# Patient Record
Sex: Male | Born: 1970 | Race: Black or African American | Hispanic: No | Marital: Married | Smoking: Never smoker
Health system: Southern US, Community
[De-identification: ages and names within clinical notes are randomized; demographics above are authoritative.]

## PROBLEM LIST (undated history)

## (undated) DIAGNOSIS — I5020 Unspecified systolic (congestive) heart failure: Secondary | ICD-10-CM

## (undated) DIAGNOSIS — I340 Nonrheumatic mitral (valve) insufficiency: Secondary | ICD-10-CM

## (undated) DIAGNOSIS — I10 Essential (primary) hypertension: Secondary | ICD-10-CM

## (undated) DIAGNOSIS — I428 Other cardiomyopathies: Secondary | ICD-10-CM

## (undated) DIAGNOSIS — Z95810 Presence of automatic (implantable) cardiac defibrillator: Secondary | ICD-10-CM

## (undated) DIAGNOSIS — I509 Heart failure, unspecified: Secondary | ICD-10-CM

## (undated) HISTORY — DX: Presence of automatic (implantable) cardiac defibrillator: Z95.810

## (undated) HISTORY — PX: HAND SURGERY: SHX662

## (undated) HISTORY — DX: Other cardiomyopathies: I42.8

## (undated) HISTORY — PX: NASAL SEPTUM SURGERY: SHX37

## (undated) HISTORY — DX: Nonrheumatic mitral (valve) insufficiency: I34.0

## (undated) HISTORY — DX: Unspecified systolic (congestive) heart failure: I50.20

---

## 2006-10-27 ENCOUNTER — Emergency Department (HOSPITAL_COMMUNITY): Admission: EM | Admit: 2006-10-27 | Discharge: 2006-10-27 | Payer: Self-pay | Admitting: Emergency Medicine

## 2006-12-05 ENCOUNTER — Emergency Department (HOSPITAL_COMMUNITY): Admission: EM | Admit: 2006-12-05 | Discharge: 2006-12-05 | Payer: Self-pay | Admitting: Emergency Medicine

## 2007-04-18 ENCOUNTER — Emergency Department (HOSPITAL_COMMUNITY): Admission: EM | Admit: 2007-04-18 | Discharge: 2007-04-18 | Payer: Self-pay | Admitting: Emergency Medicine

## 2007-08-05 ENCOUNTER — Emergency Department (HOSPITAL_COMMUNITY): Admission: EM | Admit: 2007-08-05 | Discharge: 2007-08-05 | Payer: Self-pay | Admitting: Emergency Medicine

## 2007-08-06 ENCOUNTER — Emergency Department (HOSPITAL_COMMUNITY): Admission: EM | Admit: 2007-08-06 | Discharge: 2007-08-06 | Payer: Self-pay | Admitting: Emergency Medicine

## 2007-08-27 ENCOUNTER — Emergency Department (HOSPITAL_COMMUNITY): Admission: EM | Admit: 2007-08-27 | Discharge: 2007-08-27 | Payer: Self-pay | Admitting: Emergency Medicine

## 2007-10-03 ENCOUNTER — Ambulatory Visit: Payer: Self-pay | Admitting: *Deleted

## 2007-10-03 ENCOUNTER — Ambulatory Visit: Payer: Self-pay | Admitting: Internal Medicine

## 2007-12-19 ENCOUNTER — Ambulatory Visit: Payer: Self-pay | Admitting: Family Medicine

## 2007-12-28 ENCOUNTER — Ambulatory Visit: Payer: Self-pay | Admitting: Family Medicine

## 2007-12-29 ENCOUNTER — Ambulatory Visit: Payer: Self-pay | Admitting: Internal Medicine

## 2007-12-29 LAB — CONVERTED CEMR LAB
ALT: 18 units/L (ref 0–53)
Basophils Absolute: 0 10*3/uL (ref 0.0–0.1)
CO2: 24 meq/L (ref 19–32)
Calcium: 9.5 mg/dL (ref 8.4–10.5)
Chloride: 101 meq/L (ref 96–112)
Hemoglobin: 14 g/dL (ref 13.0–17.0)
Lymphocytes Relative: 40 % (ref 12–46)
Neutro Abs: 2.2 10*3/uL (ref 1.7–7.7)
Neutrophils Relative %: 47 % (ref 43–77)
Platelets: 280 10*3/uL (ref 150–400)
Potassium: 4.4 meq/L (ref 3.5–5.3)
RDW: 16 % — ABNORMAL HIGH (ref 11.5–15.5)
Sodium: 137 meq/L (ref 135–145)
TSH: 0.932 microintl units/mL (ref 0.350–4.50)
Total Protein: 8 g/dL (ref 6.0–8.3)

## 2008-01-08 ENCOUNTER — Emergency Department (HOSPITAL_COMMUNITY): Admission: EM | Admit: 2008-01-08 | Discharge: 2008-01-08 | Payer: Self-pay | Admitting: Emergency Medicine

## 2008-01-10 ENCOUNTER — Ambulatory Visit: Payer: Self-pay | Admitting: Cardiology

## 2008-01-17 ENCOUNTER — Encounter: Payer: Self-pay | Admitting: Cardiology

## 2008-01-17 ENCOUNTER — Ambulatory Visit (HOSPITAL_COMMUNITY): Admission: RE | Admit: 2008-01-17 | Discharge: 2008-01-17 | Payer: Self-pay | Admitting: Cardiology

## 2008-01-17 ENCOUNTER — Ambulatory Visit: Payer: Self-pay | Admitting: Cardiology

## 2008-01-31 ENCOUNTER — Ambulatory Visit: Payer: Self-pay | Admitting: Family Medicine

## 2008-01-31 LAB — CONVERTED CEMR LAB
LDL Cholesterol: 120 mg/dL — ABNORMAL HIGH (ref 0–99)
Pro B Natriuretic peptide (BNP): 39 pg/mL (ref 0.0–100.0)
VLDL: 12 mg/dL (ref 0–40)

## 2008-04-12 ENCOUNTER — Emergency Department (HOSPITAL_COMMUNITY): Admission: EM | Admit: 2008-04-12 | Discharge: 2008-04-12 | Payer: Self-pay | Admitting: Emergency Medicine

## 2008-04-13 ENCOUNTER — Emergency Department (HOSPITAL_COMMUNITY): Admission: EM | Admit: 2008-04-13 | Discharge: 2008-04-13 | Payer: Self-pay | Admitting: Emergency Medicine

## 2008-08-03 ENCOUNTER — Emergency Department (HOSPITAL_COMMUNITY): Admission: EM | Admit: 2008-08-03 | Discharge: 2008-08-03 | Payer: Self-pay | Admitting: Emergency Medicine

## 2009-02-18 ENCOUNTER — Emergency Department (HOSPITAL_COMMUNITY): Admission: EM | Admit: 2009-02-18 | Discharge: 2009-02-18 | Payer: Self-pay | Admitting: Emergency Medicine

## 2009-07-27 ENCOUNTER — Emergency Department (HOSPITAL_BASED_OUTPATIENT_CLINIC_OR_DEPARTMENT_OTHER): Admission: EM | Admit: 2009-07-27 | Discharge: 2009-07-27 | Payer: Self-pay | Admitting: Emergency Medicine

## 2009-09-24 ENCOUNTER — Emergency Department (HOSPITAL_BASED_OUTPATIENT_CLINIC_OR_DEPARTMENT_OTHER): Admission: EM | Admit: 2009-09-24 | Discharge: 2009-09-24 | Payer: Self-pay | Admitting: Emergency Medicine

## 2010-05-02 ENCOUNTER — Emergency Department (HOSPITAL_BASED_OUTPATIENT_CLINIC_OR_DEPARTMENT_OTHER)
Admission: EM | Admit: 2010-05-02 | Discharge: 2010-05-02 | Disposition: A | Discharge status: Home / Self Care | Payer: Self-pay | Attending: Emergency Medicine | Admitting: Emergency Medicine

## 2010-05-02 DIAGNOSIS — Z8614 Personal history of Methicillin resistant Staphylococcus aureus infection: Secondary | ICD-10-CM | POA: Insufficient documentation

## 2010-05-02 DIAGNOSIS — IMO0002 Reserved for concepts with insufficient information to code with codable children: Secondary | ICD-10-CM | POA: Insufficient documentation

## 2010-06-24 LAB — CULTURE, ROUTINE-ABSCESS

## 2010-07-22 NOTE — Assessment & Plan Note (Signed)
[facility]                            CARDIOLOGY OFFICE NOTE   NAME:Jeffery Glenn, Jeffery Glenn                          MRN:          [id number]  DATE:01/10/2008                            DOB:          1970/09/09    PRIMARY CARE PHYSICIAN:  Dineen Kid. Reche Dixon, MD   REASON FOR PRESENTATION:  Evaluate the patient with premature  ventricular contractions.   HISTORY OF PRESENT ILLNESS:  The patient is a very pleasant 40 year old  gentleman who had some chest discomfort some years ago and reports a  stress test.  He has a very dramatic family history.  Recently, he was  getting some teeth pulled and was noted to have PVCs.  He went to the  [facility] and there they did record PVCs in a bigeminal pattern.  He  says occasionally he notices these.  It is usually when he is quiet at  night.  He does not notice them during the day.  He cannot bring them  on.  He does not have any presyncope or syncope.  He feels his heart  flip or skip.  He does not feel any sustained tachy palpitations.  He  does not get any chest pressure, neck or arm discomfort.  He does get  dyspneic walking up stairs which she has noticed more recently.  He  works but does not exercise routinely.   PAST MEDICAL HISTORY:  He has formal history of hypertension, though he  has noted his blood pressures have been elevated.  He has no history of  hyperlipidemia or diabetes.   PAST SURGICAL HISTORY:  Tonsillectomy, nasal reconstruction, right hand  surgery.   ALLERGIES:  HAY FEVER.   MEDICATIONS:  Claritin and Mucinex.   SOCIAL HISTORY:  The patient is a Curator.  He is married.  He has 5  children, all living at home.  He has never smoked cigarettes.  He does  not drink alcohol.   FAMILY HISTORY:  Contributory for his father having coronary artery  disease starting in his 69s with repeat bypasses.  He died of coronary  artery disease at age 93.  His mother died of a massive myocardial  infarction  at age 51.  He has 4 siblings without heart disease.   REVIEW OF SYSTEMS:  As stated in the HPI,  positive for recent headache.  Negative for other systems.   PHYSICAL EXAMINATION:  GENERAL:  The patient is pleasant and in no  distress.  VITAL SIGNS:  Blood pressure 166/108, heart rate 54 and regular, body  mass index 27, weight 213 pounds.  HEENT:  Eyelids are unremarkable, pupils are equal, round and reactive  to light, fundi not visualized, oral mucosa unremarkable.  NECK:  No jugular venous distention at 45 degrees, carotid upstroke  brisk and symmetric, no bruits, no thyromegaly.  LYMPHATICS:  No cervical, axillary, or inguinal adenopathy.  LUNGS:  Clear to auscultation bilaterally.  BACK:  No costovertebral angle tenderness.  CHEST:  Unremarkable.  HEART:  PMI is somewhat sustained but not broad, it is not displaced  slightly laterally.  S1  and S2 within normals, no S3, no S4, no clicks,  no rubs, no murmurs.  ABDOMEN:  Flat, positive bowel sounds normal in frequency and pitch, no  bruits, no rebound, on guarding or midline pulsatile mass, no  hepatomegaly, no splenomegaly.  SKIN:  No rashes, no nodules.  EXTREMITIES:  2+ pulses throughout, no edema, no cyanosis, no clubbing.  NEURO:  Oriented to person, place and time, cranial nerves II-XII  grossly intact, motor grossly intact throughout.   EKG sinus rhythm, rate 54, axis within normal limits, intervals within  normal limits, minimal voltage criteria for left ventricle hypertrophy,  no acute ST-wave changes.   ASSESSMENT AND PLAN:  1. Premature ventricular attractions.  The patient is not really      noticing these.  He had electrolytes that were normal.  His TSH was      normal.  At this point, I am going to get an echocardiogram for      variable reasons.  I think, he probably has a structurally normal      heart however.  If this turns out to be normal and he is having no      symptoms then I probably would not pursue  further evaluation.  2. Hypertension.  His blood pressure is elevated today.  He says it      has been elevated on a couple other occasions, but he does not get      it checked very frequently.  I have asked him to invest on a      pressure cuff.  I will go ahead with that family history and      aggressively manage this.  I am going to start him on      hydrochlorothiazide 12.5 mg a day.  He will only increase his      potassium containing foods.  We will also look for LVH on his      echocardiogram.  3. Risk reduction.  The patient has a very dramatic family history of      early coronary artery disease.  Given this, he needs to know his      lipid profile.  I would be very aggressive in managing this and      have a low threshold for statins.  I have talked to him about this.      I will go ahead and order a fasting lipid profile.  4. Followup.  I would like to see him back in about 3 months to review      all of this, but sooner if he has any problems.     Rollene Rotunda, MD, [facility]  Electronically Signed    JH/MedQ  DD: 01/10/2008  DT: 01/10/2008  Job #: [id number]   cc:   Dineen Kid. Reche Dixon, M.D.

## 2010-07-26 ENCOUNTER — Emergency Department (HOSPITAL_BASED_OUTPATIENT_CLINIC_OR_DEPARTMENT_OTHER)
Admission: EM | Admit: 2010-07-26 | Discharge: 2010-07-27 | Disposition: A | Discharge status: Home / Self Care | Payer: Self-pay | Attending: Emergency Medicine | Admitting: Emergency Medicine

## 2010-07-26 DIAGNOSIS — K047 Periapical abscess without sinus: Secondary | ICD-10-CM | POA: Insufficient documentation

## 2010-07-26 DIAGNOSIS — K089 Disorder of teeth and supporting structures, unspecified: Secondary | ICD-10-CM | POA: Insufficient documentation

## 2010-09-04 ENCOUNTER — Emergency Department (HOSPITAL_COMMUNITY)
Admission: EM | Admit: 2010-09-04 | Discharge: 2010-09-04 | Discharge status: Against Medical Advice | Payer: Self-pay | Attending: Emergency Medicine | Admitting: Emergency Medicine

## 2010-09-04 ENCOUNTER — Emergency Department (HOSPITAL_BASED_OUTPATIENT_CLINIC_OR_DEPARTMENT_OTHER)
Admission: EM | Admit: 2010-09-04 | Discharge: 2010-09-04 | Disposition: A | Discharge status: Home / Self Care | Payer: Self-pay | Attending: Emergency Medicine | Admitting: Emergency Medicine

## 2010-09-04 DIAGNOSIS — R220 Localized swelling, mass and lump, head: Secondary | ICD-10-CM | POA: Insufficient documentation

## 2010-09-04 DIAGNOSIS — R221 Localized swelling, mass and lump, neck: Secondary | ICD-10-CM | POA: Insufficient documentation

## 2010-09-04 DIAGNOSIS — K089 Disorder of teeth and supporting structures, unspecified: Secondary | ICD-10-CM | POA: Insufficient documentation

## 2011-01-22 ENCOUNTER — Emergency Department (INDEPENDENT_AMBULATORY_CARE_PROVIDER_SITE_OTHER): Payer: Self-pay

## 2011-01-22 ENCOUNTER — Encounter: Payer: Self-pay | Admitting: *Deleted

## 2011-01-22 ENCOUNTER — Emergency Department (HOSPITAL_BASED_OUTPATIENT_CLINIC_OR_DEPARTMENT_OTHER)
Admission: EM | Admit: 2011-01-22 | Discharge: 2011-01-23 | Disposition: A | Discharge status: Home / Self Care | Payer: Self-pay | Attending: Emergency Medicine | Admitting: Emergency Medicine

## 2011-01-22 DIAGNOSIS — S2239XA Fracture of one rib, unspecified side, initial encounter for closed fracture: Secondary | ICD-10-CM | POA: Insufficient documentation

## 2011-01-22 DIAGNOSIS — R0789 Other chest pain: Secondary | ICD-10-CM
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
## 2011-01-23 MED ORDER — OXYCODONE-ACETAMINOPHEN 5-325 MG PO TABS
2.0000 | ORAL_TABLET | Freq: Once | ORAL | Status: AC
  Administered 2011-01-23: 2 via ORAL
  Filled 2011-01-23: qty 2

## 2011-01-23 MED ORDER — OXYCODONE-ACETAMINOPHEN 5-325 MG PO TABS: 2.0000 | ORAL_TABLET | ORAL | Status: AC | PRN
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
## 2015-02-08 ENCOUNTER — Emergency Department (HOSPITAL_BASED_OUTPATIENT_CLINIC_OR_DEPARTMENT_OTHER)
Admission: EM | Admit: 2015-02-08 | Discharge: 2015-02-09 | Disposition: A | Discharge status: Home / Self Care | Payer: Self-pay | Attending: Emergency Medicine | Admitting: Emergency Medicine

## 2015-02-08 ENCOUNTER — Encounter (HOSPITAL_BASED_OUTPATIENT_CLINIC_OR_DEPARTMENT_OTHER): Payer: Self-pay | Admitting: Emergency Medicine

## 2015-02-08 DIAGNOSIS — I10 Essential (primary) hypertension: Secondary | ICD-10-CM | POA: Insufficient documentation

## 2015-02-08 DIAGNOSIS — Z79899 Other long term (current) drug therapy: Secondary | ICD-10-CM | POA: Insufficient documentation

## 2015-02-08 DIAGNOSIS — R109 Unspecified abdominal pain: Secondary | ICD-10-CM | POA: Insufficient documentation

## 2015-02-08 DIAGNOSIS — J069 Acute upper respiratory infection, unspecified: Secondary | ICD-10-CM | POA: Insufficient documentation

## 2015-02-08 DIAGNOSIS — R110 Nausea: Secondary | ICD-10-CM | POA: Insufficient documentation

## 2015-02-08 HISTORY — DX: Essential (primary) hypertension: I10

## 2015-02-08 MED ORDER — BENZONATATE 100 MG PO CAPS: 100.0000 mg | ORAL_CAPSULE | Freq: Three times a day (TID) | ORAL | Status: DC

## 2015-02-08 MED ORDER — HYDROCODONE-HOMATROPINE 5-1.5 MG/5ML PO SYRP: 5.0000 mL | ORAL_SOLUTION | Freq: Four times a day (QID) | ORAL | Status: DC | PRN

## 2015-02-08 MED ORDER — PROMETHAZINE HCL 25 MG PO TABS: 25.0000 mg | ORAL_TABLET | Freq: Four times a day (QID) | ORAL | Status: DC | PRN

## 2015-02-08 NOTE — Discharge Instructions (Signed)
Upper Respiratory Infection, Adult Most upper respiratory infections (URIs) are a viral infection of the air passages leading to the lungs. A URI affects the nose, throat, and upper air passages. The most common type of URI is nasopharyngitis and is typically referred to as "the common cold." URIs run their course and usually go away on their own. Most of the time, a URI does not require medical attention, but sometimes a bacterial infection in the upper airways can follow a viral infection. This is called a secondary infection. Sinus and middle ear infections are common types of secondary upper respiratory infections. Bacterial pneumonia can also complicate a URI. A URI can worsen asthma and chronic obstructive pulmonary disease (COPD). Sometimes, these complications can require emergency medical care and may be life threatening.  CAUSES Almost all URIs are caused by viruses. A virus is a type of germ and can spread from one person to another.  RISKS FACTORS You may be at risk for a URI if:   You smoke.   You have chronic heart or lung disease.  You have a weakened defense (immune) system.   You are very young or very old.   You have nasal allergies or asthma.  You work in crowded or poorly ventilated areas.  You work in health care facilities or schools. SIGNS AND SYMPTOMS  Symptoms typically develop 2-3 days after you come in contact with a cold virus. Most viral URIs last 7-10 days. However, viral URIs from the influenza virus (flu virus) can last 14-18 days and are typically more severe. Symptoms may include:   Runny or stuffy (congested) nose.   Sneezing.   Cough.   Sore throat.   Headache.   Fatigue.   Fever.   Loss of appetite.   Pain in your forehead, behind your eyes, and over your cheekbones (sinus pain).  Muscle aches.  DIAGNOSIS  Your health care provider may diagnose a URI by:  Physical exam.  Tests to check that your symptoms are not due to  another condition such as:  Strep throat.  Sinusitis.  Pneumonia.  Asthma. TREATMENT  A URI goes away on its own with time. It cannot be cured with medicines, but medicines may be prescribed or recommended to relieve symptoms. Medicines may help:  Reduce your fever.  Reduce your cough.  Relieve nasal congestion. HOME CARE INSTRUCTIONS   Take medicines only as directed by your health care provider.   Gargle warm saltwater or take cough drops to comfort your throat as directed by your health care provider.  Use a warm mist humidifier or inhale steam from a shower to increase air moisture. This may make it easier to breathe.  Drink enough fluid to keep your urine clear or pale yellow.   Eat soups and other clear broths and maintain good nutrition.   Rest as needed.   Return to work when your temperature has returned to normal or as your health care provider advises. You may need to stay home longer to avoid infecting others. You can also use a face mask and careful hand washing to prevent spread of the virus.  Increase the usage of your inhaler if you have asthma.   Do not use any tobacco products, including cigarettes, chewing tobacco, or electronic cigarettes. If you need help quitting, ask your health care provider. PREVENTION  The best way to protect yourself from getting a cold is to practice good hygiene.   Avoid oral or hand contact with people with cold   symptoms.   Wash your hands often if contact occurs.  There is no clear evidence that vitamin C, vitamin E, echinacea, or exercise reduces the chance of developing a cold. However, it is always recommended to get plenty of rest, exercise, and practice good nutrition.  SEEK MEDICAL CARE IF:   You are getting worse rather than better.   Your symptoms are not controlled by medicine.   You have chills.  You have worsening shortness of breath.  You have brown or red mucus.  You have yellow or brown nasal  discharge.  You have pain in your face, especially when you bend forward.  You have a fever.  You have swollen neck glands.  You have pain while swallowing.  You have white areas in the back of your throat. SEEK IMMEDIATE MEDICAL CARE IF:   You have severe or persistent:  Headache.  Ear pain.  Sinus pain.  Chest pain.  You have chronic lung disease and any of the following:  Wheezing.  Prolonged cough.  Coughing up blood.  A change in your usual mucus.  You have a stiff neck.  You have changes in your:  Vision.  Hearing.  Thinking.  Mood. MAKE SURE YOU:   Understand these instructions.  Will watch your condition.  Will get help right away if you are not doing well or get worse.   This information is not intended to replace advice given to you by your health care provider. Make sure you discuss any questions you have with your health care provider.   Document Released: 08/19/2000 Document Revised: 07/10/2014 Document Reviewed: 05/31/2013 Elsevier Interactive Patient Education 2016 [employer] Inc.  

## 2015-02-08 NOTE — ED Notes (Signed)
Sore throat, coughing congestion for 3 days.

## 2015-02-08 NOTE — ED Provider Notes (Signed)
CSN: [id number]     Arrival date & time 02/08/15  2316 History  By signing my name below, I, Gwenyth Ober, attest that this documentation has been prepared under the direction and in the presence of Gilda Crease, MD.  Electronically Signed: Gwenyth Ober, ED Scribe. 02/08/2015. 11:32 PM.   Chief Complaint  Patient presents with  . Sore Throat   The history is provided by the patient. No language interpreter was used.    HPI Comments: Jeffery Glenn is a 44 y.o. male who presents to the Emergency Department complaining of constant, moderate sore throat that started 3 days ago. He states abdominal pain that occurred yesterday, nausea and cough as associated symptoms. Pt has not tried any treatment PTA. He denies vomiting and diarrhea.  Past Medical History  Diagnosis Date  . Hypertension    Past Surgical History  Procedure Laterality Date  . Nasal septum surgery     No family history on file. Social History  Substance Use Topics  . Smoking status: Never Smoker   . Smokeless tobacco: None  . Alcohol Use: Yes     Comment: occ   Review of Systems  HENT: Positive for sore throat.   Respiratory: Positive for cough.   Gastrointestinal: Positive for nausea and abdominal pain. Negative for vomiting and diarrhea.  All other systems reviewed and are negative.  Allergies  Review of patient's allergies indicates no known allergies.  Home Medications   Prior to Admission medications   Medication Sig Start Date End Date Taking? Authorizing Provider  amLODipine (NORVASC) 5 MG tablet Take 5 mg by mouth daily.   Yes Historical Provider, MD  hydrochlorothiazide (HYDRODIURIL) 25 MG tablet Take 25 mg by mouth daily.   Yes Historical Provider, MD  benzonatate (TESSALON) 100 MG capsule Take 1 capsule (100 mg total) by mouth every 8 (eight) hours. 02/08/15   Gilda Crease, MD  HYDROcodone-homatropine [facility]) 5-1.5 MG/5ML syrup Take 5 mLs by mouth every 6 (six) hours as needed  for cough. 02/08/15   Gilda Crease, MD  promethazine (PHENERGAN) 25 MG tablet Take 1 tablet (25 mg total) by mouth every 6 (six) hours as needed for nausea or vomiting. 02/08/15   Gilda Crease, MD   BP 170/91 mmHg  Pulse 74  Temp(Src) 98.3 F (36.8 C) (Oral)  Resp 18  Ht 6' (1.829 m)  Wt 235 lb (106.595 kg)  BMI 31.86 kg/m2 Physical Exam  Constitutional: He appears well-developed and well-nourished. No distress.  HENT:  Head: Normocephalic and atraumatic.  Right Ear: External ear normal.  Left Ear: External ear normal.  Mouth/Throat: Oropharynx is clear and moist. No oropharyngeal exudate.  Eyes: Conjunctivae and EOM are normal. Pupils are equal, round, and reactive to light.  Neck: Neck supple. No tracheal deviation present.  Cardiovascular: Normal rate, regular rhythm and normal heart sounds.   Pulmonary/Chest: Effort normal and breath sounds normal. No respiratory distress. He has no wheezes. He has no rales.  Abdominal: Soft. There is no tenderness.  Skin: Skin is warm and dry.  Psychiatric: He has a normal mood and affect. His behavior is normal.  Nursing note and vitals reviewed.   ED Course  Procedures   COORDINATION OF CARE: 11:33 PM Discussed suspicion for viral infection and treatment plan with pt. They agreed to plan.  Labs Review Labs Reviewed - No data to display  Imaging Review No results found.   EKG Interpretation None      MDM   Final  diagnoses:  URI, acute   Presents to the emergency department for evaluation of cough, chest congestion, sore throat. Symptoms began 3 days ago. Patient has clear lung fields on examination. Vital signs are stable. Oropharyngeal examination is normal, no sign peritonsillar abscess or tonsillitis. Symptoms consistent with viral URI. Will treat symptomatically.  I personally performed the services described in this documentation, which was scribed in my presence. The recorded information has been  reviewed and is accurate.    Gilda Crease, MD 02/08/15 [phone]

## 2015-03-15 ENCOUNTER — Encounter (HOSPITAL_BASED_OUTPATIENT_CLINIC_OR_DEPARTMENT_OTHER): Payer: Self-pay | Admitting: Emergency Medicine

## 2015-03-15 ENCOUNTER — Emergency Department (HOSPITAL_BASED_OUTPATIENT_CLINIC_OR_DEPARTMENT_OTHER): Payer: Self-pay

## 2015-03-15 ENCOUNTER — Emergency Department (HOSPITAL_BASED_OUTPATIENT_CLINIC_OR_DEPARTMENT_OTHER)
Admission: EM | Admit: 2015-03-15 | Discharge: 2015-03-15 | Disposition: A | Discharge status: Home / Self Care | Payer: Self-pay | Attending: Emergency Medicine | Admitting: Emergency Medicine

## 2015-03-15 DIAGNOSIS — J069 Acute upper respiratory infection, unspecified: Secondary | ICD-10-CM

## 2015-03-15 DIAGNOSIS — I10 Essential (primary) hypertension: Secondary | ICD-10-CM

## 2015-03-15 DIAGNOSIS — Z79899 Other long term (current) drug therapy: Secondary | ICD-10-CM | POA: Insufficient documentation

## 2015-03-15 DIAGNOSIS — R079 Chest pain, unspecified: Secondary | ICD-10-CM | POA: Insufficient documentation

## 2015-03-15 MED ORDER — AMLODIPINE BESYLATE 5 MG PO TABS: 5.0000 mg | ORAL_TABLET | Freq: Every day | ORAL | Status: DC

## 2015-03-15 MED ORDER — GUAIFENESIN ER 600 MG PO TB12: 600.0000 mg | ORAL_TABLET | Freq: Two times a day (BID) | ORAL | Status: DC

## 2015-03-15 MED ORDER — AMLODIPINE BESYLATE 5 MG PO TABS
5.0000 mg | ORAL_TABLET | Freq: Every day | ORAL | Status: DC
  Administered 2015-03-15: 5 mg via ORAL
  Filled 2015-03-15: qty 1

## 2015-03-15 MED ORDER — HYDROCHLOROTHIAZIDE 25 MG PO TABS: 25.0000 mg | ORAL_TABLET | Freq: Every day | ORAL | Status: DC

## 2015-03-15 MED ORDER — BENZONATATE 100 MG PO CAPS: 100.0000 mg | ORAL_CAPSULE | Freq: Three times a day (TID) | ORAL | Status: DC

## 2015-03-15 MED ORDER — HYDROCHLOROTHIAZIDE 25 MG PO TABS
25.0000 mg | ORAL_TABLET | Freq: Every day | ORAL | Status: DC
  Administered 2015-03-15: 25 mg via ORAL
  Filled 2015-03-15: qty 1

## 2015-03-15 NOTE — ED Notes (Signed)
Pt having runny nose, cough/cold symptoms for two weeks.  Pt worse in last 2-3 days with increase in cough, chest discomfort and headache.  Productive green sputum.  No known fever.

## 2015-03-15 NOTE — ED Provider Notes (Addendum)
CSN: [id number]     Arrival date & time 03/15/15  0701 History   First MD Initiated Contact with Patient 03/15/15 0720     Chief Complaint  Patient presents with  . Cough    Patient is a 46 y.o. male presenting with cough. The history is provided by the patient.  Cough Cough characteristics:  Non-productive Severity:  Moderate Duration:  3 weeks Timing:  Constant Chronicity:  New Smoker: no   Context: sick contacts   Relieved by:  Nothing Ineffective treatments: nyquil. Associated symptoms: chest pain, rhinorrhea and sinus congestion   Associated symptoms: no fever and no wheezing    patient does have history of hypertension. He doesn't have a primary doctor currently and has not taken his medications for a couple of months  Past Medical History  Diagnosis Date  . Hypertension    Past Surgical History  Procedure Laterality Date  . Nasal septum surgery     No family history on file. Social History  Substance Use Topics  . Smoking status: Never Smoker   . Smokeless tobacco: None  . Alcohol Use: Yes     Comment: occ    Review of Systems  Constitutional: Negative for fever.  HENT: Positive for rhinorrhea.   Respiratory: Positive for cough. Negative for wheezing.   Cardiovascular: Positive for chest pain.  All other systems reviewed and are negative.     Allergies  Review of patient's allergies indicates no known allergies.  Home Medications   Prior to Admission medications   Medication Sig Start Date End Date Taking? Authorizing Provider  amLODipine (NORVASC) 5 MG tablet Take 5 mg by mouth daily.    Historical Provider, MD  hydrochlorothiazide (HYDRODIURIL) 25 MG tablet Take 25 mg by mouth daily.    Historical Provider, MD   BP 179/106 mmHg  Pulse 82  Temp(Src) 98.3 F (36.8 C) (Oral)  Resp 20  Ht 6' (1.829 m)  Wt 104.327 kg  BMI 31.19 kg/m2  SpO2 100% Physical Exam  Constitutional: He appears well-developed and well-nourished. No distress.  HENT:   Head: Normocephalic and atraumatic.  Right Ear: Tympanic membrane and external ear normal.  Left Ear: Tympanic membrane and external ear normal.  Mouth/Throat: No oropharyngeal exudate, posterior oropharyngeal edema or posterior oropharyngeal erythema.  Eyes: Conjunctivae are normal. Right eye exhibits no discharge. Left eye exhibits no discharge. No scleral icterus.  Neck: Neck supple. No tracheal deviation present.  Cardiovascular: Normal rate, regular rhythm and intact distal pulses.   Pulmonary/Chest: Effort normal and breath sounds normal. No stridor. No respiratory distress. He has no wheezes. He has no rales.  Abdominal: Soft. Bowel sounds are normal. He exhibits no distension. There is no tenderness. There is no rebound and no guarding.  Musculoskeletal: He exhibits no edema or tenderness.  Neurological: He is alert. He has normal strength. No cranial nerve deficit (no facial droop, extraocular movements intact, no slurred speech) or sensory deficit. He exhibits normal muscle tone. He displays no seizure activity. Coordination normal.  Skin: Skin is warm and dry. No rash noted.  Psychiatric: He has a normal mood and affect.  Nursing note and vitals reviewed.   ED Course  Procedures  Imaging Review Dg Chest 2 View  03/15/2015  CLINICAL DATA:  Chest pain with productive cough and fever. EXAM: CHEST  2 VIEW COMPARISON:  01/22/2011 FINDINGS: The heart size and mediastinal contours are within normal limits. Both lungs are clear. The visualized skeletal structures are unremarkable. IMPRESSION: Normal chest. Electronically  Signed   By: Francene Boyers M.D.   On: 03/15/2015 07:46     MDM   Final diagnoses:  URI (upper respiratory infection)  Essential hypertension    Symptoms are consistent with a simple upper respiratory infection. There is no evidence to suggest pneumonia on CXR. The patient does not appear to have an otitis media. I discussed supportive treatment. I encouraged  followup with the primary care doctor next week if symptoms have not resolved. Warning signs and reasons to return to the emergency room were discussed.  Pt was given a dose of his BP meds.  Rx given     Linwood Dibbles, MD 03/15/15 0753   EKG Interpretation  Date/Time:  Friday March 15 2015 07:19:36 EST Ventricular Rate:  75 PR Interval:  189 QRS Duration: 102 QT Interval:  382 QTC Calculation: 427 R Axis:   -39 Text Interpretation:  Sinus rhythm Left axis deviation RSR' in V1 or V2, probably normal variant ST elev, probable normal early repol pattern No old tracing to compare Reconfirmed by [facility]  MD-J, Sumit Branham [phone]) on 03/18/2015 9:23:39 AM        Linwood Dibbles, MD 03/18/15 [phone]

## 2015-03-15 NOTE — Discharge Instructions (Signed)
Cough, Adult Coughing is a reflex that clears your throat and your airways. Coughing helps to heal and protect your lungs. It is normal to cough occasionally, but a cough that happens with other symptoms or lasts a long time may be a sign of a condition that needs treatment. A cough may last only 2-3 weeks (acute), or it may last longer than 8 weeks (chronic). CAUSES Coughing is commonly caused by:  Breathing in substances that irritate your lungs.  A viral or bacterial respiratory infection.  Allergies.  Asthma.  Postnasal drip.  Smoking.  Acid backing up from the stomach into the esophagus (gastroesophageal reflux).  Certain medicines.  Chronic lung problems, including COPD (or rarely, lung cancer).  Other medical conditions such as heart failure. HOME CARE INSTRUCTIONS  Pay attention to any changes in your symptoms. Take these actions to help with your discomfort:  Take medicines only as told by your health care provider.  If you were prescribed an antibiotic medicine, take it as told by your health care provider. Do not stop taking the antibiotic even if you start to feel better.  Talk with your health care provider before you take a cough suppressant medicine.  Drink enough fluid to keep your urine clear or pale yellow.  If the air is dry, use a cold steam vaporizer or humidifier in your bedroom or your home to help loosen secretions.  Avoid anything that causes you to cough at work or at home.  If your cough is worse at night, try sleeping in a semi-upright position.  Avoid cigarette smoke. If you smoke, quit smoking. If you need help quitting, ask your health care provider.  Avoid caffeine.  Avoid alcohol.  Rest as needed. SEEK MEDICAL CARE IF:   You have new symptoms.  You cough up pus.  Your cough does not get better after 2-3 weeks, or your cough gets worse.  You cannot control your cough with suppressant medicines and you are losing sleep.  You  develop pain that is getting worse or pain that is not controlled with pain medicines.  You have a fever.  You have unexplained weight loss.  You have night sweats. SEEK IMMEDIATE MEDICAL CARE IF:  You cough up blood.  You have difficulty breathing.  Your heartbeat is very fast.   This information is not intended to replace advice given to you by your health care provider. Make sure you discuss any questions you have with your health care provider.   Document Released: 08/22/2010 Document Revised: 11/14/2014 Document Reviewed: 05/02/2014 Elsevier Interactive Patient Education 2016 [employer]. Hypertension Hypertension, commonly called high blood pressure, is when the force of blood pumping through your arteries is too strong. Your arteries are the blood vessels that carry blood from your heart throughout your body. A blood pressure reading consists of a higher number over a lower number, such as 110/72. The higher number (systolic) is the pressure inside your arteries when your heart pumps. The lower number (diastolic) is the pressure inside your arteries when your heart relaxes. Ideally you want your blood pressure below 120/80. Hypertension forces your heart to work harder to pump blood. Your arteries may become narrow or stiff. Having untreated or uncontrolled hypertension can cause heart attack, stroke, kidney disease, and other problems. RISK FACTORS Some risk factors for high blood pressure are controllable. Others are not.  Risk factors you cannot control include:   Race. You may be at higher risk if you are African American.  Age.  Risk increases with age.  Gender. Men are at higher risk than women before age 43 years. After age 43, women are at higher risk than men. Risk factors you can control include:  Not getting enough exercise or physical activity.  Being overweight.  Getting too much fat, sugar, calories, or salt in your diet.  Drinking too much alcohol. SIGNS  AND SYMPTOMS Hypertension does not usually cause signs or symptoms. Extremely high blood pressure (hypertensive crisis) may cause headache, anxiety, shortness of breath, and nosebleed. DIAGNOSIS To check if you have hypertension, your health care provider will measure your blood pressure while you are seated, with your arm held at the level of your heart. It should be measured at least twice using the same arm. Certain conditions can cause a difference in blood pressure between your right and left arms. A blood pressure reading that is higher than normal on one occasion does not mean that you need treatment. If it is not clear whether you have high blood pressure, you may be asked to return on a different day to have your blood pressure checked again. Or, you may be asked to monitor your blood pressure at home for 1 or more weeks. TREATMENT Treating high blood pressure includes making lifestyle changes and possibly taking medicine. Living a healthy lifestyle can help lower high blood pressure. You may need to change some of your habits. Lifestyle changes may include:  Following the DASH diet. This diet is high in fruits, vegetables, and whole grains. It is low in salt, red meat, and added sugars.  Keep your sodium intake below 2,300 mg per day.  Getting at least 30-45 minutes of aerobic exercise at least 4 times per week.  Losing weight if necessary.  Not smoking.  Limiting alcoholic beverages.  Learning ways to reduce stress. Your health care provider may prescribe medicine if lifestyle changes are not enough to get your blood pressure under control, and if one of the following is true:  You are 4518-43 years of age and your systolic blood pressure is above 140.  You are 43 years of age or older, and your systolic blood pressure is above 150.  Your diastolic blood pressure is above 90.  You have diabetes, and your systolic blood pressure is over 140 or your diastolic blood pressure is  over 90.  You have kidney disease and your blood pressure is above 140/90.  You have heart disease and your blood pressure is above 140/90. Your personal target blood pressure may vary depending on your medical conditions, your age, and other factors. HOME CARE INSTRUCTIONS  Have your blood pressure rechecked as directed by your health care provider.   Take medicines only as directed by your health care provider. Follow the directions carefully. Blood pressure medicines must be taken as prescribed. The medicine does not work as well when you skip doses. Skipping doses also puts you at risk for problems.  Do not smoke.   Monitor your blood pressure at home as directed by your health care provider. SEEK MEDICAL CARE IF:   You think you are having a reaction to medicines taken.  You have recurrent headaches or feel dizzy.  You have swelling in your ankles.  You have trouble with your vision. SEEK IMMEDIATE MEDICAL CARE IF:  You develop a severe headache or confusion.  You have unusual weakness, numbness, or feel faint.  You have severe chest or abdominal pain.  You vomit repeatedly.  You have trouble breathing. MAKE SURE  YOU:   Understand these instructions.  Will watch your condition.  Will get help right away if you are not doing well or get worse.   This information is not intended to replace advice given to you by your health care provider. Make sure you discuss any questions you have with your health care provider.   Document Released: 02/23/2005 Document Revised: 07/10/2014 Document Reviewed: 12/16/2012 Elsevier Interactive Patient Education [employer]

## 2015-04-10 ENCOUNTER — Emergency Department (HOSPITAL_COMMUNITY): Payer: Self-pay

## 2015-04-10 ENCOUNTER — Encounter (HOSPITAL_COMMUNITY): Payer: Self-pay

## 2015-04-10 ENCOUNTER — Emergency Department (HOSPITAL_COMMUNITY)
Admission: EM | Admit: 2015-04-10 | Discharge: 2015-04-10 | Disposition: A | Discharge status: Home / Self Care | Payer: Self-pay | Attending: Emergency Medicine | Admitting: Emergency Medicine

## 2015-04-10 DIAGNOSIS — R079 Chest pain, unspecified: Secondary | ICD-10-CM | POA: Insufficient documentation

## 2015-04-10 DIAGNOSIS — R0602 Shortness of breath: Secondary | ICD-10-CM | POA: Insufficient documentation

## 2015-04-10 DIAGNOSIS — I10 Essential (primary) hypertension: Secondary | ICD-10-CM | POA: Insufficient documentation

## 2015-04-10 DIAGNOSIS — R200 Anesthesia of skin: Secondary | ICD-10-CM | POA: Insufficient documentation

## 2015-04-10 LAB — BASIC METABOLIC PANEL
Anion gap: 9 (ref 5–15)
BUN: 14 mg/dL (ref 6–20)
CHLORIDE: 105 mmol/L (ref 101–111)
CO2: 25 mmol/L (ref 22–32)
Calcium: 8.6 mg/dL — ABNORMAL LOW (ref 8.9–10.3)
Creatinine, Ser: 0.78 mg/dL (ref 0.61–1.24)
GFR calc Af Amer: >60 mL/min (ref >60)
GLUCOSE: 105 mg/dL — AB (ref 65–99)
POTASSIUM: 3.7 mmol/L (ref 3.5–5.1)
Sodium: 139 mmol/L (ref 135–145)

## 2015-04-10 LAB — I-STAT TROPONIN, ED
TROPONIN I, POC: 0.03 ng/mL (ref 0.00–0.08)
TROPONIN I, POC: 0.02 ng/mL (ref 0.00–0.08)

## 2015-04-10 LAB — CBC
HCT: 40.8 % (ref 39.0–52.0)
HEMOGLOBIN: 13.5 g/dL (ref 13.0–17.0)
MCH: 26.9 pg (ref 26.0–34.0)
MCHC: 33.1 g/dL (ref 30.0–36.0)
MCV: 81.3 fL (ref 78.0–100.0)
Platelets: 200 10*3/uL (ref 150–400)
RBC: 5.02 MIL/uL (ref 4.22–5.81)
RDW: 16.4 % — ABNORMAL HIGH (ref 11.5–15.5)
WBC: 4.8 10*3/uL (ref 4.0–10.5)

## 2015-04-10 MED ORDER — AMLODIPINE BESYLATE 5 MG PO TABS: 5.0000 mg | ORAL_TABLET | Freq: Every day | ORAL | Status: DC

## 2015-04-10 MED ORDER — AMLODIPINE BESYLATE 5 MG PO TABS
5.0000 mg | ORAL_TABLET | Freq: Once | ORAL | Status: AC
  Administered 2015-04-10: 5 mg via ORAL
  Filled 2015-04-10: qty 1

## 2015-04-10 MED ORDER — HYDROCHLOROTHIAZIDE 25 MG PO TABS
25.0000 mg | ORAL_TABLET | Freq: Once | ORAL | Status: AC
  Administered 2015-04-10: 25 mg via ORAL
  Filled 2015-04-10: qty 1

## 2015-04-10 MED ORDER — HYDROCHLOROTHIAZIDE 25 MG PO TABS: 25.0000 mg | ORAL_TABLET | Freq: Every day | ORAL | Status: DC

## 2015-04-10 NOTE — Discharge Instructions (Signed)
It is important that you start taking the blood pressure medicine. Resource guide provided to help you find follow-up. Try the wellness clinic first.  Return for any new or worse symptoms. Today's workup for the chest pain without any acute findings.   Emergency Department Resource Guide 1) Find a Doctor and Pay Out of Pocket Although you won't have to find out who is covered by your insurance plan, it is a good idea to ask around and get recommendations. You will then need to call the office and see if the doctor you have chosen will accept you as a new patient and what types of options they offer for patients who are self-pay. Some doctors offer discounts or will set up payment plans for their patients who do not have insurance, but you will need to ask so you aren't surprised when you get to your appointment.  2) Contact Your Local Health Department Not all health departments have doctors that can see patients for sick visits, but many do, so it is worth a call to see if yours does. If you don't know where your local health department is, you can check in your phone book. The CDC also has a tool to help you locate your state's health department, and many state websites also have listings of all of their local health departments.  3) Find a Walk-in Clinic If your illness is not likely to be very severe or complicated, you may want to try a walk in clinic. These are popping up all over the country in pharmacies, drugstores, and shopping centers. They're usually staffed by nurse practitioners or physician assistants that have been trained to treat common illnesses and complaints. They're usually fairly quick and inexpensive. However, if you have serious medical issues or chronic medical problems, these are probably not your best option.  No Primary Care Doctor: - Call Health Connect at  [phone] - they can help you locate a primary care doctor that  accepts your insurance, provides certain services,  etc. - Physician Referral Service- [phone]  Chronic Pain Problems: Organization         Address  Phone   Notes  [facility]  [phone] Patients need to be referred by their primary care doctor.   Medication Assistance: Organization         Address  Phone   Notes  [facility] Medication [facility] [address] [place] [place], [place] [address] [phone] --Must be a resident of [facility] -- Must have NO insurance coverage whatsoever (no Medicaid/ Medicare, etc.) -- The pt. MUST have a primary care doctor that directs their care regularly and follows them in the community   [facility]  [phone]   [employer]  [phone]    Agencies that provide inexpensive medical care: Organization         Address  Phone   Notes  [facility]  [phone]   [facility]    [phone]   [facility] [address] [place], [place] [address] [phone]   [facility] [address] [place], [place] [phone]   Planned Parenthood    [phone]   [facility]    [phone]   [facility]  [address], [place] Phone:  [phone], Fax:  [phone] Hours of Operation:  9 am - 6 pm, M-F.  Also accepts  Medicaid/Medicare and self-pay.  [facility] for [address], [address], [place] Phone: [phone], Fax: [phone]. Hours of Operation:  8:30 am - 5:30 pm, M-F.  Also accepts Medicaid and self-pay.  [facility] [place] [address], [place] Phone: [phone]   [place], [place], [place] [phone], Ext. [phone] Mondays & Thursdays: 7-9 AM.  First 15 patients are seen on a first come, first serve basis.    [place] Providers:  Organization         Address  Phone   Notes  [facility] [address], [place] [phone] Also accepts self-pay patients.  [facility] [address], [place]  [phone]   [place], [address], [place] [phone]   [facility] [address], [place] [phone]   Lucianne Lei [address], [place]   [phone] Only accepts [place] Access [place] patients after they have their name applied to their card.   Self-Pay (no insurance) in [facility]:  Organization         Address  Phone   Notes  Sickle Cell Patients, [facility] Internal [facility] [place] [phone]   [facility] [place] [phone]   [facility] [place]  [place], [place], [place] [phone]   [facility]/Dr. Osei-Bonsu  [address], [place] or [address], [place] [phone] Phone number for both [place] and [place] locations is the same.  Urgent Medical and [facility] [address], [place] [phone]   [facility] [address], [place] or [address] [phone]   [facility] [address], [place] [phone], phone; [phone], fax Sees patients 1st and 3rd Saturday of every month.  Must not qualify for public or private insurance (i.e. Medicaid, Medicare, [place] Health Choice, Veterans' Benefits)  Household income should be no more than 200% of the poverty level The clinic cannot treat you if you are pregnant or think you are pregnant  Sexually transmitted diseases are not treated at the clinic.    Dental Care: Organization         Address  Phone  Notes  [facility] of [facility] [place] [phone] Accepts children up to  age 83 who are enrolled in [place] or [place]; pregnant women with a Medicaid card; and children who have applied for Medicaid or [place] Health Choice, but were declined, whose parents can pay a reduced fee at time of service.  [facility] Department of [facility]  [address], [place] [phone] Accepts children up to age 81 who are enrolled in [place] or [place]; pregnant women with a Medicaid card; and children who have applied for Medicaid or [place] Health Choice, but were declined, whose parents can pay a reduced fee at time of service.  [facility]  [place] [phone] Patients are seen by appointment only. Walk-ins are not accepted. J[place]re will see patients 27 years of age and older. Monday - Tuesday (8am-5pm) Most  Wednesdays (8:30-5pm) $30 per visit, cash only  [facility] Adult Dental Access PROGRAM  [address], [facility] [phone] Patients are seen by appointment only. Walk-ins are not accepted. Natalia will see patients 31 years of age and older. One Wednesday Evening (Monthly: Volunteer Based).  $30 per visit, cash only  [place]  [phone] for adults; Children under age 20, call Graduate Pediatric Dentistry at [phone]. Children aged 59-14, please call [phone] to request a pediatric application.  Dental services are provided in all areas of dental care including fillings, crowns and bridges, complete and partial dentures, implants, gum treatment, root canals, and extractions. Preventive care is also provided. Treatment is provided to both adults and children. Patients are selected via a lottery and there is often a waiting list.   [facility] [address], [place]  [phone] [email or web address]   [facility] [address] [place], [place] [phone], Ext. [phone] Second and Fourth Thursday of  each month, opens at 6:30 AM; Clinic ends at 9 AM.  Patients are seen on a first-come first-served basis, and a limited number are seen during each clinic.   [facility]  [address] Hillard Danker [place], [place] [phone]   Eligibility Requirements You must have lived in [place], [place], or [place] counties for at least the last three months.   You cannot be eligible for state or federal sponsored [employer], including [employer], [place], or [employer].   You generally cannot be eligible for healthcare insurance through your employer.    How to apply: Eligibility screenings are held every Tuesday and Wednesday afternoon from 1:00 pm until 4:00 pm. You do not need an appointment for the interview!  [facility] [address] [place], [place]   [place]  [place] Department  [place]  [phone]    Behavioral Health Resources in the Community: Intensive Outpatient Programs Organization         Address  Phone  Notes  [facility] [place]. [address], [place], [place] [phone]   [facility] [address], [place], [place]   [facility]: [facility] [address] [place], [place]   [place] [address]  [place], [place] or [phone]   Substance Abuse Resources Organization         Address  Phone  Notes  Al[facility] Drug Services  [phone]   [place]  [phone]   [facility] [place]   Chinita Pester  [phone]   Residential & Outpatient Substance Abuse Program  [phone]   Psychological Services Organization         Address  Phone  Notes  [facility]  [place]  [phone]   [place] [address],  [place] or [phone]    Mobile Crisis Teams Organization         Address  Phone  Notes  [facility], Mobile Crisis Care Unit  [phone]   [facility]  [address] [place], [place]   [place] Levels [address] [place] [phone]    Self-Help/Support Groups Organization         Address  Phone  Notes  [facility] [place] - variety of support groups  [place] Call for more information  Narcotics Anonymous (NA), [facility] [address], [employer] [place]  2 meetings at this location   Special educational needs teacher         Address  Phone  Notes  [facility]idential Treatment [place],    [place]  [phone]   [facility]  [address], [place] [phone], [place], [place]   [place], [place] [phone] Admissions: 8am-3pm M-F  Incentives Substance [place] [address],    [place], [place] [phone]   [facility] [address] [place], [place], [place]   [facility] [address]  [place], [place]   [facility] - Intensive Outpatient [address], [place], [place]   [facility] ([place].,  [place], [place] [phone] or [phone]   [facility] ([facility]) [address] [place], [place] Accepts Medicaid  [facility] [place] [address],  [place] [phone] Substance Abuse/Addiction Treatment   [facility] Organization         Address  Phone  Notes  [facility]  [phone]   Domenic Schwab, PhD [address] Arlis [place], [place]   [phone] or [phone]   [place], [place] [phone]     [facility] [address], [place], [place] [phone] Insurance/Medicaid/sponsorship through [facility] and Families [address], [address]                                    [place], [place] [phone] [place] [address], [place] [phone]    Dr. Adele Schilder  [phone]   [facility] Dept. 1) [address], [place] 2) [place] 3)  [address], [place] [phone]  [phone]   [place] [phone] or [phone] (After Hours)

## 2015-04-10 NOTE — ED Notes (Signed)
Pt from home with complains of chest pain, dizziness, and feel like his blood pressure is elevated. Pt states he has high blood pressure but has not taken his medications. This has been going on for about a week but got worse this morning.

## 2015-04-10 NOTE — ED Notes (Signed)
Unhooked pt to go to restroom.

## 2015-04-10 NOTE — ED Provider Notes (Signed)
CSN: [id number]     Arrival date & time 04/10/15  4854 History   First MD Initiated Contact with Patient 04/10/15 0715     Chief Complaint  Patient presents with  . Chest Pain  . Dizziness     (Consider location/radiation/quality/duration/timing/severity/associated sxs/prior Treatment) Patient is a 45 y.o. male presenting with chest pain and dizziness. The history is provided by the patient.  Chest Pain Associated symptoms: dizziness, numbness and shortness of breath   Associated symptoms: no abdominal pain, no back pain, no fever, no nausea, no palpitations and not vomiting   Dizziness Associated symptoms: chest pain and shortness of breath   Associated symptoms: no nausea, no palpitations and no vomiting    Patient with complaint of intermittent left-sided chest pain radiating to left arm over the past 2 weeks. Increasing pain as of 8:30 last evening. Associated with slight shortness of breath no nausea vomiting no diaphoresis patient has a known history of hypertension but has been noncompliant on taking meds. Patient states he lost his meds that were given to him at the beginning of January. Asian currently not taking any hypertensive meds. Patient having difficulty finding follow-up.  Patient states the chest pain as sharp ache radiates into the left arm. Associated with some numbness to the left arm. No radiation to the back neck or jaw. Other associated symptoms included a feeling of dizziness. No true vertigo.  Past Medical History  Diagnosis Date  . Hypertension    Past Surgical History  Procedure Laterality Date  . Nasal septum surgery     History reviewed. No pertinent family history. Social History  Substance Use Topics  . Smoking status: Never Smoker   . Smokeless tobacco: None  . Alcohol Use: Yes     Comment: occ    Review of Systems  Constitutional: Negative for fever.  HENT: Negative for congestion.   Eyes: Negative for visual disturbance.  Respiratory:  Positive for shortness of breath.   Cardiovascular: Positive for chest pain. Negative for palpitations and leg swelling.  Gastrointestinal: Negative for nausea, vomiting and abdominal pain.  Genitourinary: Negative for dysuria.  Musculoskeletal: Negative for back pain and neck pain.  Skin: Negative for rash.  Neurological: Positive for dizziness and numbness.  Hematological: Does not bruise/bleed easily.  Psychiatric/Behavioral: Negative for confusion.      Allergies  Review of patient's allergies indicates no known allergies.  Home Medications   Prior to Admission medications   Medication Sig Start Date End Date Taking? Authorizing Provider  amLODipine (NORVASC) 5 MG tablet Take 1 tablet (5 mg total) by mouth daily. Patient not taking: Reported on 04/10/2015 03/15/15   Linwood Dibbles, MD  benzonatate (TESSALON) 100 MG capsule Take 1 capsule (100 mg total) by mouth every 8 (eight) hours. Patient not taking: Reported on 04/10/2015 03/15/15   Linwood Dibbles, MD  guaiFENesin (MUCINEX) 600 MG 12 hr tablet Take 1 tablet (600 mg total) by mouth 2 (two) times daily. Patient not taking: Reported on 04/10/2015 03/15/15   Linwood Dibbles, MD  hydrochlorothiazide (HYDRODIURIL) 25 MG tablet Take 1 tablet (25 mg total) by mouth daily. Patient not taking: Reported on 04/10/2015 03/15/15   Linwood Dibbles, MD   BP 170/108 mmHg  Pulse 68  Temp(Src) 97.6 F (36.4 C) (Oral)  Resp 17  Ht 6' (1.829 m)  Wt 97.523 kg  BMI 29.15 kg/m2  SpO2 100% Physical Exam  Constitutional: He is oriented to person, place, and time. He appears well-developed and well-nourished. No distress.  HENT:  Head: Normocephalic and atraumatic.  Mouth/Throat: Oropharynx is clear and moist.  Eyes: Conjunctivae and EOM are normal. Pupils are equal, round, and reactive to light.  Neck: Normal range of motion. Neck supple.  Cardiovascular: Normal rate, regular rhythm and normal heart sounds.   No murmur heard. Pulmonary/Chest: Effort normal and breath sounds  normal. No respiratory distress.  Abdominal: Soft. Bowel sounds are normal. There is no tenderness.  Musculoskeletal: Normal range of motion. He exhibits no edema.  Neurological: He is alert and oriented to person, place, and time. No cranial nerve deficit. He exhibits normal muscle tone. Coordination normal.  Skin: Skin is warm. No rash noted.  Nursing note and vitals reviewed.   ED Course  Procedures (including critical care time) Labs Review Labs Reviewed  BASIC METABOLIC PANEL - Abnormal; Notable for the following:    Glucose, Bld 105 (*)    Calcium 8.6 (*)    All other components within normal limits  CBC - Abnormal; Notable for the following:    RDW 16.4 (*)    All other components within normal limits  I-STAT TROPOININ, ED  I-STAT TROPOININ, ED   Results for orders placed or performed during the hospital encounter of 04/10/15  Basic metabolic panel  Result Value Ref Range   Sodium 139 135 - 145 mmol/L   Potassium 3.7 3.5 - 5.1 mmol/L   Chloride 105 101 - 111 mmol/L   CO2 25 22 - 32 mmol/L   Glucose, Bld 105 (H) 65 - 99 mg/dL   BUN 14 6 - 20 mg/dL   Creatinine, Ser 1.61 0.61 - 1.24 mg/dL   Calcium 8.6 (L) 8.9 - 10.3 mg/dL   GFR calc non Af Amer >60 >60 mL/min   GFR calc Af Amer >60 >60 mL/min   Anion gap 9 5 - 15  CBC  Result Value Ref Range   WBC 4.8 4.0 - 10.5 K/uL   RBC 5.02 4.22 - 5.81 MIL/uL   Hemoglobin 13.5 13.0 - 17.0 g/dL   HCT 09.6 04.5 - 40.9 %   MCV 81.3 78.0 - 100.0 fL   MCH 26.9 26.0 - 34.0 pg   MCHC 33.1 30.0 - 36.0 g/dL   RDW 81.1 (H) 91.4 - 78.2 %   Platelets 200 150 - 400 K/uL  I-stat troponin, ED (not at [facility], [facility])  Result Value Ref Range   Troponin i, poc 0.03 0.00 - 0.08 ng/mL   Comment 3          I-Stat Troponin, ED (not at [facility])  Result Value Ref Range   Troponin i, poc 0.02 0.00 - 0.08 ng/mL   Comment 3             Imaging Review Dg Chest 2 View  04/10/2015  CLINICAL DATA:  Intermittent chest pain for week with tightness and  shortness of breath. EXAM: CHEST  2 VIEW COMPARISON:  Chest CT 04/18/2007.  PA and lateral chest 03/15/2015. FINDINGS: The lungs are clear. Heart size is upper normal. No pneumothorax or pleural effusion. No focal bony abnormality. IMPRESSION: No acute disease. Electronically Signed   By: Drusilla Kanner M.D.   On: 04/10/2015 07:35   I have personally reviewed and evaluated these images and lab results as part of my medical decision-making.   EKG Interpretation   Date/Time:  Wednesday April 10 2015 06:39:53 EST Ventricular Rate:  65 PR Interval:  191 QRS Duration: 107 QT Interval:  419 QTC Calculation: 436 R Axis:  3 Text Interpretation:  Sinus rhythm RSR' in V1 or V2, right VCD or RVH ST  elev, probable normal early repol pattern Confirmed by Windsor Goeken  MD,  Nithya Meriweather [phone]) on 04/10/2015 7:30:54 AM      MDM   Final diagnoses:  Chest pain, unspecified chest pain type  Essential hypertension      Workup for the chest pain without any acute findings. Troponins 2 were negative. Patient's had intermittent chest pain on and off for 2 weeks. To get more severe last evening around 8:30. Chest x-rays negative for pneumonia pneumothorax or pulmonary edema. Troponins negative 2 is mentioned. Basic electrolytes without any significant abnormalities to include the renal abnormality. No anemia.  Patient's been noncompliant with his blood pressure medicines actually lost medicines were given to home on January 6. We'll renew medications. Patient given one dose of both hydrochlorothiazide and the Norvasc here. No significant change in the blood pressure based on that. Blood pressure discharge 172/97. Patient nontoxic no acute distress. Patient stable for discharge home with blood pressure medicines and resource guide and follow-up.  Work note provided.  Vanetta Mulders, MD 04/10/15 [phone]

## 2015-05-06 ENCOUNTER — Emergency Department (HOSPITAL_BASED_OUTPATIENT_CLINIC_OR_DEPARTMENT_OTHER)
Admission: EM | Admit: 2015-05-06 | Discharge: 2015-05-06 | Disposition: A | Discharge status: Home / Self Care | Payer: Self-pay | Attending: Emergency Medicine | Admitting: Emergency Medicine

## 2015-05-06 ENCOUNTER — Encounter (HOSPITAL_BASED_OUTPATIENT_CLINIC_OR_DEPARTMENT_OTHER): Payer: Self-pay | Admitting: Emergency Medicine

## 2015-05-06 DIAGNOSIS — Z79899 Other long term (current) drug therapy: Secondary | ICD-10-CM | POA: Insufficient documentation

## 2015-05-06 DIAGNOSIS — B349 Viral infection, unspecified: Secondary | ICD-10-CM | POA: Insufficient documentation

## 2015-05-06 DIAGNOSIS — I10 Essential (primary) hypertension: Secondary | ICD-10-CM | POA: Insufficient documentation

## 2015-05-06 MED ORDER — ONDANSETRON 4 MG PO TBDP: 4.0000 mg | ORAL_TABLET | Freq: Three times a day (TID) | ORAL | Status: DC | PRN

## 2015-05-06 MED ORDER — ONDANSETRON 4 MG PO TBDP
4.0000 mg | ORAL_TABLET | Freq: Once | ORAL | Status: AC
  Administered 2015-05-06: 4 mg via ORAL
  Filled 2015-05-06: qty 1

## 2015-05-06 NOTE — ED Provider Notes (Signed)
CSN: [id number]     Arrival date & time 05/06/15  0912 History   First MD Initiated Contact with Patient 05/06/15 [phone]     Chief Complaint  Patient presents with  . Abdominal Pain     (Consider location/radiation/quality/duration/timing/severity/associated sxs/prior Treatment) Patient is a 45 y.o. male presenting with abdominal pain. The history is provided by the patient. No language interpreter was used.  Abdominal Pain Pain location:  Generalized Pain quality: aching   Pain radiates to:  Does not radiate Pain severity:  Mild Onset quality:  Gradual Duration:  1 day Timing:  Constant Progression:  Worsening Chronicity:  New Relieved by:  Nothing Worsened by:  Nothing tried Ineffective treatments:  None tried Associated symptoms: no fever   Risk factors: alcohol abuse    Pt is here with child who is sick.  Pt complains of nausea and diarrhea.  Past Medical History  Diagnosis Date  . Hypertension    Past Surgical History  Procedure Laterality Date  . Nasal septum surgery     No family history on file. Social History  Substance Use Topics  . Smoking status: Never Smoker   . Smokeless tobacco: None  . Alcohol Use: Yes     Comment: occ    Review of Systems  Constitutional: Negative for fever.  Gastrointestinal: Positive for abdominal pain.  All other systems reviewed and are negative.     Allergies  Review of patient's allergies indicates no known allergies.  Home Medications   Prior to Admission medications   Medication Sig Start Date End Date Taking? Authorizing Provider  amLODipine (NORVASC) 5 MG tablet Take 1 tablet (5 mg total) by mouth daily. Patient not taking: Reported on 04/10/2015 03/15/15   Linwood Dibbles, MD  amLODipine (NORVASC) 5 MG tablet Take 1 tablet (5 mg total) by mouth daily. 04/10/15   Vanetta Mulders, MD  benzonatate (TESSALON) 100 MG capsule Take 1 capsule (100 mg total) by mouth every 8 (eight) hours. Patient not taking: Reported on 04/10/2015  03/15/15   Linwood Dibbles, MD  guaiFENesin (MUCINEX) 600 MG 12 hr tablet Take 1 tablet (600 mg total) by mouth 2 (two) times daily. Patient not taking: Reported on 04/10/2015 03/15/15   Linwood Dibbles, MD  hydrochlorothiazide (HYDRODIURIL) 25 MG tablet Take 1 tablet (25 mg total) by mouth daily. Patient not taking: Reported on 04/10/2015 03/15/15   Linwood Dibbles, MD  hydrochlorothiazide (HYDRODIURIL) 25 MG tablet Take 1 tablet (25 mg total) by mouth daily. 04/10/15   Vanetta Mulders, MD  ondansetron (ZOFRAN ODT) 4 MG disintegrating tablet Take 1 tablet (4 mg total) by mouth every 8 (eight) hours as needed for nausea or vomiting. 05/06/15   Elson Areas, PA-C   BP 173/106 mmHg  Pulse 68  Temp(Src) 97.7 F (36.5 C) (Oral)  Resp 18  Ht 6' (1.829 m)  Wt 104.282 kg  BMI 31.17 kg/m2  SpO2 99% Physical Exam  Constitutional: He is oriented to person, place, and time. He appears well-developed and well-nourished.  HENT:  Head: Normocephalic.  Right Ear: External ear normal.  Left Ear: External ear normal.  Nose: Nose normal.  Mouth/Throat: Oropharynx is clear and moist.  Eyes: Conjunctivae and EOM are normal. Pupils are equal, round, and reactive to light.  Neck: Normal range of motion.  Cardiovascular: Normal rate and normal heart sounds.   Pulmonary/Chest: Effort normal.  Abdominal: Soft. He exhibits no distension.  Musculoskeletal: Normal range of motion.  Neurological: He is alert and oriented to person, place,  and time.  Skin: Skin is warm.  Psychiatric: He has a normal mood and affect.  Nursing note and vitals reviewed.   ED Course  Procedures (including critical care time) Labs Review Labs Reviewed - No data to display  Imaging Review No results found. I have personally reviewed and evaluated these images and lab results as part of my medical decision-making.   EKG Interpretation None      MDM   Final diagnoses:  Viral illness  Essential hypertension    Pt given zofran.   Pt tolerating  po fluids   Medication List    TAKE these medications        ondansetron 4 MG disintegrating tablet  Commonly known as:  ZOFRAN ODT  Take 1 tablet (4 mg total) by mouth every 8 (eight) hours as needed for nausea or vomiting.      ASK your doctor about these medications        amLODipine 5 MG tablet  Commonly known as:  NORVASC  Take 1 tablet (5 mg total) by mouth daily.     amLODipine 5 MG tablet  Commonly known as:  NORVASC  Take 1 tablet (5 mg total) by mouth daily.     benzonatate 100 MG capsule  Commonly known as:  TESSALON  Take 1 capsule (100 mg total) by mouth every 8 (eight) hours.     guaiFENesin 600 MG 12 hr tablet  Commonly known as:  MUCINEX  Take 1 tablet (600 mg total) by mouth 2 (two) times daily.     hydrochlorothiazide 25 MG tablet  Commonly known as:  HYDRODIURIL  Take 1 tablet (25 mg total) by mouth daily.     hydrochlorothiazide 25 MG tablet  Commonly known as:  HYDRODIURIL  Take 1 tablet (25 mg total) by mouth daily.      An After Visit Summary was printed and given to the patient.    Lonia Skinner [place], PA-C 05/06/15 1124  Azalia Bilis, MD 05/06/15 [phone]

## 2015-05-06 NOTE — Discharge Instructions (Signed)

## 2015-05-06 NOTE — ED Notes (Signed)
Onset 2days of abd oain with nausea and vomiting

## 2015-08-20 ENCOUNTER — Emergency Department (HOSPITAL_BASED_OUTPATIENT_CLINIC_OR_DEPARTMENT_OTHER)
Admission: EM | Admit: 2015-08-20 | Discharge: 2015-08-20 | Disposition: A | Discharge status: Home / Self Care | Payer: Self-pay | Attending: Emergency Medicine | Admitting: Emergency Medicine

## 2015-08-20 ENCOUNTER — Emergency Department (HOSPITAL_BASED_OUTPATIENT_CLINIC_OR_DEPARTMENT_OTHER): Payer: Self-pay

## 2015-08-20 ENCOUNTER — Encounter (HOSPITAL_BASED_OUTPATIENT_CLINIC_OR_DEPARTMENT_OTHER): Payer: Self-pay

## 2015-08-20 DIAGNOSIS — Y929 Unspecified place or not applicable: Secondary | ICD-10-CM | POA: Insufficient documentation

## 2015-08-20 DIAGNOSIS — Y999 Unspecified external cause status: Secondary | ICD-10-CM | POA: Insufficient documentation

## 2015-08-20 DIAGNOSIS — Y9389 Activity, other specified: Secondary | ICD-10-CM | POA: Insufficient documentation

## 2015-08-20 DIAGNOSIS — S67195A Crushing injury of left ring finger, initial encounter: Secondary | ICD-10-CM | POA: Insufficient documentation

## 2015-08-20 DIAGNOSIS — W208XXA Other cause of strike by thrown, projected or falling object, initial encounter: Secondary | ICD-10-CM | POA: Insufficient documentation

## 2015-08-20 DIAGNOSIS — I10 Essential (primary) hypertension: Secondary | ICD-10-CM | POA: Insufficient documentation

## 2015-08-20 MED ORDER — HYDROCODONE-ACETAMINOPHEN 5-325 MG PO TABS
1.0000 | ORAL_TABLET | Freq: Four times a day (QID) | ORAL | Status: DC | PRN
Start: 2015-08-20 — End: 2016-04-03

## 2015-08-20 MED ORDER — CEPHALEXIN 250 MG PO CAPS
1000.0000 mg | ORAL_CAPSULE | Freq: Once | ORAL | Status: AC
  Administered 2015-08-20: 1000 mg via ORAL
  Filled 2015-08-20: qty 4

## 2015-08-20 MED ORDER — HYDROCODONE-ACETAMINOPHEN 5-325 MG PO TABS
1.0000 | ORAL_TABLET | Freq: Once | ORAL | Status: AC
  Administered 2015-08-20: 1 via ORAL
  Filled 2015-08-20: qty 1

## 2015-08-20 MED ORDER — CEPHALEXIN 500 MG PO CAPS: 500.0000 mg | ORAL_CAPSULE | Freq: Four times a day (QID) | ORAL | Status: DC

## 2015-08-20 MED ORDER — TETANUS-DIPHTH-ACELL PERTUSSIS 5-2.5-18.5 LF-MCG/0.5 IM SUSP
0.5000 mL | Freq: Once | INTRAMUSCULAR | Status: AC
  Administered 2015-08-20: 0.5 mL via INTRAMUSCULAR
  Filled 2015-08-20: qty 0.5
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
## 2015-08-20 NOTE — ED Provider Notes (Signed)
CSN: [id number]     Arrival date & time 08/20/15  2059 History   None    Chief Complaint  Patient presents with  . Finger Injury     (Consider location/radiation/quality/duration/timing/severity/associated sxs/prior Treatment) HPI  This is a 45 year old male who dropped a tire on his left ring finger about 3 hours ago. He is having moderate to severe pain in the distal phalanx of that finger. There is associated oozing of serous fluid from underneath the nail. There is associated swelling. Sensation remains intact but range of motion at the DIP joint is limited due to pain and swelling.  Past Medical History  Diagnosis Date  . Hypertension    Past Surgical History  Procedure Laterality Date  . Nasal septum surgery     No family history on file. Social History  Substance Use Topics  . Smoking status: Never Smoker   . Smokeless tobacco: None  . Alcohol Use: No    Review of Systems  All other systems reviewed and are negative.   Allergies  Review of patient's allergies indicates no known allergies.  Home Medications   Prior to Admission medications   Medication Sig Start Date End Date Taking? Authorizing Provider  amLODipine (NORVASC) 5 MG tablet Take 1 tablet (5 mg total) by mouth daily. Patient not taking: Reported on 04/10/2015 03/15/15   Linwood Dibbles, MD  amLODipine (NORVASC) 5 MG tablet Take 1 tablet (5 mg total) by mouth daily. 04/10/15   Vanetta Mulders, MD  benzonatate (TESSALON) 100 MG capsule Take 1 capsule (100 mg total) by mouth every 8 (eight) hours. Patient not taking: Reported on 04/10/2015 03/15/15   Linwood Dibbles, MD  guaiFENesin (MUCINEX) 600 MG 12 hr tablet Take 1 tablet (600 mg total) by mouth 2 (two) times daily. Patient not taking: Reported on 04/10/2015 03/15/15   Linwood Dibbles, MD  hydrochlorothiazide (HYDRODIURIL) 25 MG tablet Take 1 tablet (25 mg total) by mouth daily. Patient not taking: Reported on 04/10/2015 03/15/15   Linwood Dibbles, MD  hydrochlorothiazide (HYDRODIURIL) 25  MG tablet Take 1 tablet (25 mg total) by mouth daily. 04/10/15   Vanetta Mulders, MD  ondansetron (ZOFRAN ODT) 4 MG disintegrating tablet Take 1 tablet (4 mg total) by mouth every 8 (eight) hours as needed for nausea or vomiting. 05/06/15   Elson Areas, PA-C   BP 178/109 mmHg  Pulse 86  Temp(Src) 98.5 F (36.9 C) (Oral)  Resp 18  Ht 6' (1.829 m)  Wt 215 lb (97.523 kg)  BMI 29.15 kg/m2  SpO2 99%   Physical Exam  General: Well-developed, well-nourished male in no acute distress; appearance consistent with age of record HENT: normocephalic; atraumatic Eyes: Normal appearance Neck: supple Heart: regular rate and rhythm Lungs: Normal respiratory effort and excursion Abdomen: soft; nondistended Extremities: No deformity; tenderness, ecchymosis and swelling of the distal phalanx of the left ring finger with decreased range of motion at the PIP joint, sensation intact in the fingertip:   Neurologic: Awake, alert and oriented; motor function intact in all extremities and symmetric; no facial droop Skin: Warm and dry Psychiatric: Normal mood and affect    ED Course  Procedures (including critical care time)   MDM  Nursing notes and vitals signs, including pulse oximetry, reviewed.  Summary of this visit's results, reviewed by myself:  Imaging Studies: Dg Finger Ring Left  08/20/2015  CLINICAL DATA:  Left ring finger injury EXAM: LEFT RING FINGER 2+V COMPARISON:  None. FINDINGS: There is a displaced/comminuted fracture involving the  tuft of the fourth distal phalanx. Alignment at the adjacent DIP joint is normal. No other osseous fracture or displacement seen. Soft tissue swelling noted about the distal phalanx. IMPRESSION: Displaced/comminuted fracture involving the tuft of the left fourth distal phalanx. Electronically Signed   By: Bary Richard M.D.   On: 08/20/2015 21:41       Paula Libra, MD 08/20/15 2328

## 2015-08-20 NOTE — ED Notes (Signed)
Pt verbalizes understanding of d/c instructions and denies any further needs at this time. 

## 2016-04-03 ENCOUNTER — Encounter (HOSPITAL_BASED_OUTPATIENT_CLINIC_OR_DEPARTMENT_OTHER): Payer: Self-pay | Admitting: Emergency Medicine

## 2016-04-03 ENCOUNTER — Emergency Department (HOSPITAL_BASED_OUTPATIENT_CLINIC_OR_DEPARTMENT_OTHER): Payer: Self-pay

## 2016-04-03 ENCOUNTER — Emergency Department (HOSPITAL_BASED_OUTPATIENT_CLINIC_OR_DEPARTMENT_OTHER)
Admission: EM | Admit: 2016-04-03 | Discharge: 2016-04-03 | Disposition: A | Discharge status: Home / Self Care | Payer: Self-pay | Attending: Emergency Medicine | Admitting: Emergency Medicine

## 2016-04-03 DIAGNOSIS — Y939 Activity, unspecified: Secondary | ICD-10-CM | POA: Insufficient documentation

## 2016-04-03 DIAGNOSIS — S39012A Strain of muscle, fascia and tendon of lower back, initial encounter: Secondary | ICD-10-CM | POA: Insufficient documentation

## 2016-04-03 DIAGNOSIS — I10 Essential (primary) hypertension: Secondary | ICD-10-CM | POA: Insufficient documentation

## 2016-04-03 DIAGNOSIS — X58XXXA Exposure to other specified factors, initial encounter: Secondary | ICD-10-CM | POA: Insufficient documentation

## 2016-04-03 DIAGNOSIS — Y929 Unspecified place or not applicable: Secondary | ICD-10-CM | POA: Insufficient documentation

## 2016-04-03 DIAGNOSIS — Y999 Unspecified external cause status: Secondary | ICD-10-CM | POA: Insufficient documentation

## 2016-04-03 MED ORDER — AMLODIPINE BESYLATE 10 MG PO TABS: 10.0000 mg | ORAL_TABLET | Freq: Every day | ORAL | 0 refills | Status: DC

## 2016-04-03 MED ORDER — KETOROLAC TROMETHAMINE 30 MG/ML IJ SOLN
30.0000 mg | Freq: Once | INTRAMUSCULAR | Status: AC
  Administered 2016-04-03: 30 mg via INTRAMUSCULAR
  Filled 2016-04-03: qty 1

## 2016-04-03 MED ORDER — HYDROCHLOROTHIAZIDE 25 MG PO TABS: 25.0000 mg | ORAL_TABLET | Freq: Every day | ORAL | 0 refills | Status: DC

## 2016-04-03 MED ORDER — HYDROCHLOROTHIAZIDE 25 MG PO TABS
25.0000 mg | ORAL_TABLET | Freq: Once | ORAL | Status: AC
  Administered 2016-04-03: 25 mg via ORAL
  Filled 2016-04-03: qty 1

## 2016-04-03 MED ORDER — IBUPROFEN 600 MG PO TABS: 600.0000 mg | ORAL_TABLET | Freq: Four times a day (QID) | ORAL | 0 refills | Status: DC | PRN

## 2016-04-03 NOTE — ED Triage Notes (Signed)
Pt having right upper back pain relieved by position x 4 hours.  Recurrent  Pain, has had in the past.  Pt also needing BP meds.

## 2016-04-03 NOTE — ED Provider Notes (Signed)
[facility]   CSN: [id number] Arrival date & time: 04/03/16  6381     History   Chief Complaint Chief Complaint  Patient presents with  . Back Pain    HPI Jeffery Glenn is a 46 y.o. male.  Pt presents to the ED today with right upper back pain.  Pt said it started about 4 hrs pta.  The pt had the same pain about 2 weeks ago.  He put a pain patch on it and it went away.  He has not done any unusual activity lately.  Pt had a little sob when the pain first hit him, but none now.      Past Medical History:  Diagnosis Date  . Hypertension     There are no active problems to display for this patient.   Past Surgical History:  Procedure Laterality Date  . HAND SURGERY    . NASAL SEPTUM SURGERY         Home Medications    Prior to Admission medications   Medication Sig Start Date End Date Taking? Authorizing Provider  amLODipine (NORVASC) 10 MG tablet Take 1 tablet (10 mg total) by mouth daily. 04/03/16   Jacalyn Lefevre, MD  hydrochlorothiazide (HYDRODIURIL) 25 MG tablet Take 1 tablet (25 mg total) by mouth daily. 04/03/16   Jacalyn Lefevre, MD  ibuprofen (ADVIL,MOTRIN) 600 MG tablet Take 1 tablet (600 mg total) by mouth every 6 (six) hours as needed. 04/03/16   Jacalyn Lefevre, MD    Family History No family history on file.  Social History Social History  Substance Use Topics  . Smoking status: Never Smoker  . Smokeless tobacco: Not on file  . Alcohol use No     Allergies   Patient has no known allergies.   Review of Systems Review of Systems  Musculoskeletal: Positive for back pain.  All other systems reviewed and are negative.    Physical Exam Updated Vital Signs BP (!) 176/104 (BP Location: Right Arm) Comment: Pt out of BP medication  Pulse 72   Temp 98.1 F (36.7 C) (Oral)   Resp 18   Ht 6' (1.829 m)   Wt 215 lb (97.5 kg)   SpO2 97%   BMI 29.16 kg/m   Physical Exam  Constitutional: He is oriented to person,  place, and time. He appears well-developed and well-nourished.  HENT:  Head: Normocephalic and atraumatic.  Right Ear: External ear normal.  Left Ear: External ear normal.  Nose: Nose normal.  Mouth/Throat: Oropharynx is clear and moist.  Eyes: Conjunctivae and EOM are normal. Pupils are equal, round, and reactive to light.  Neck: Normal range of motion. Neck supple.  Cardiovascular: Normal rate, regular rhythm, normal heart sounds and intact distal pulses.   Pulmonary/Chest: Effort normal and breath sounds normal.  Abdominal: Soft. Bowel sounds are normal.  Musculoskeletal: Normal range of motion.       Arms: Neurological: He is alert and oriented to person, place, and time.  Skin: Skin is warm and dry.  Psychiatric: He has a normal mood and affect. His behavior is normal. Judgment and thought content normal.  Nursing note and vitals reviewed.    ED Treatments / Results  Labs (all labs ordered are listed, but only abnormal results are displayed) Labs Reviewed - No data to display  EKG  EKG Interpretation None       Radiology Dg Chest 2 View  Result Date: 04/03/2016 CLINICAL DATA:  Upper back pain EXAM: CHEST  2 VIEW COMPARISON:  04/10/2015 FINDINGS: Cardiomegaly. No confluent airspace opacities or effusions. No acute bony abnormality. IMPRESSION: Cardiomegaly.  No active disease. Electronically Signed   By: Charlett Nose M.D.   On: 04/03/2016 08:22    Procedures Procedures (including critical care time)  Medications Ordered in ED Medications  ketorolac (TORADOL) 30 MG/ML injection 30 mg (30 mg Intramuscular Given 04/03/16 0802)  hydrochlorothiazide (HYDRODIURIL) tablet 25 mg (25 mg Oral Given 04/03/16 0802)     Initial Impression / Assessment and Plan / ED Course  I have reviewed the triage vital signs and the nursing notes.  Pertinent labs & imaging results that were available during my care of the patient were reviewed by me and considered in my medical decision  making (see chart for details).    Pt is feeling better.  I suspect pain is muscular.  Pt's bp is high and he has cardiomegaly on CXR.   He has not been taking his bp meds.  I told him about the CM and encouraged him to take his meds.  He does not have a pcp or health insurance, so it has been difficult for him to get a doctor.  I gave him a 1 month supply of meds and the number to [facility].  He knows to return if worse.  Final Clinical Impressions(s) / ED Diagnoses   Final diagnoses:  Back strain, initial encounter  Essential hypertension    New Prescriptions New Prescriptions   IBUPROFEN (ADVIL,MOTRIN) 600 MG TABLET    Take 1 tablet (600 mg total) by mouth every 6 (six) hours as needed.     Jacalyn Lefevre, MD 04/03/16 [phone]

## 2016-11-02 ENCOUNTER — Encounter (HOSPITAL_BASED_OUTPATIENT_CLINIC_OR_DEPARTMENT_OTHER): Payer: Self-pay | Admitting: *Deleted

## 2016-11-02 ENCOUNTER — Emergency Department (HOSPITAL_BASED_OUTPATIENT_CLINIC_OR_DEPARTMENT_OTHER): Payer: Self-pay

## 2016-11-02 ENCOUNTER — Emergency Department (HOSPITAL_BASED_OUTPATIENT_CLINIC_OR_DEPARTMENT_OTHER)
Admission: EM | Admit: 2016-11-02 | Discharge: 2016-11-02 | Disposition: A | Discharge status: Home / Self Care | Payer: Self-pay | Attending: Emergency Medicine | Admitting: Emergency Medicine

## 2016-11-02 DIAGNOSIS — Y929 Unspecified place or not applicable: Secondary | ICD-10-CM | POA: Insufficient documentation

## 2016-11-02 DIAGNOSIS — S62334A Displaced fracture of neck of fourth metacarpal bone, right hand, initial encounter for closed fracture: Secondary | ICD-10-CM | POA: Insufficient documentation

## 2016-11-02 DIAGNOSIS — Y999 Unspecified external cause status: Secondary | ICD-10-CM | POA: Insufficient documentation

## 2016-11-02 DIAGNOSIS — I10 Essential (primary) hypertension: Secondary | ICD-10-CM | POA: Insufficient documentation

## 2016-11-02 DIAGNOSIS — Y939 Activity, unspecified: Secondary | ICD-10-CM | POA: Insufficient documentation

## 2016-11-02 DIAGNOSIS — Z79899 Other long term (current) drug therapy: Secondary | ICD-10-CM | POA: Insufficient documentation
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
## 2016-11-02 NOTE — Discharge Instructions (Signed)
Follow up with the hand surgeon.  Try not and use that hand if possible.    Take 4 over the counter ibuprofen tablets 3 times a day or 2 over-the-counter naproxen tablets twice a day for pain. Also take tylenol 1000mg (2 extra strength) four times a day.

## 2016-11-02 NOTE — ED Notes (Signed)
ED Provider at bedside. 
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
## 2017-01-25 DIAGNOSIS — I160 Hypertensive urgency: Secondary | ICD-10-CM | POA: Insufficient documentation

## 2017-02-10 ENCOUNTER — Other Ambulatory Visit: Payer: Self-pay

## 2017-02-10 ENCOUNTER — Emergency Department (HOSPITAL_BASED_OUTPATIENT_CLINIC_OR_DEPARTMENT_OTHER): Payer: Self-pay

## 2017-02-10 ENCOUNTER — Emergency Department (HOSPITAL_BASED_OUTPATIENT_CLINIC_OR_DEPARTMENT_OTHER)
Admission: EM | Admit: 2017-02-10 | Discharge: 2017-02-10 | Disposition: A | Discharge status: Home / Self Care | Payer: Self-pay | Attending: Emergency Medicine | Admitting: Emergency Medicine

## 2017-02-10 ENCOUNTER — Encounter (HOSPITAL_BASED_OUTPATIENT_CLINIC_OR_DEPARTMENT_OTHER): Payer: Self-pay | Admitting: Emergency Medicine

## 2017-02-10 DIAGNOSIS — I10 Essential (primary) hypertension: Secondary | ICD-10-CM | POA: Insufficient documentation

## 2017-02-10 DIAGNOSIS — J209 Acute bronchitis, unspecified: Secondary | ICD-10-CM | POA: Insufficient documentation

## 2017-02-10 DIAGNOSIS — Z79899 Other long term (current) drug therapy: Secondary | ICD-10-CM | POA: Insufficient documentation

## 2017-02-10 MED ORDER — IBUPROFEN 600 MG PO TABS: 600.0000 mg | ORAL_TABLET | Freq: Four times a day (QID) | ORAL | 0 refills | Status: DC | PRN

## 2017-02-10 MED ORDER — BENZONATATE 100 MG PO CAPS: 100.0000 mg | ORAL_CAPSULE | Freq: Three times a day (TID) | ORAL | 0 refills | Status: DC | PRN

## 2017-02-10 MED ORDER — GUAIFENESIN 100 MG/5ML PO SYRP: 100.0000 mg | ORAL_SOLUTION | ORAL | 0 refills | Status: DC | PRN

## 2017-02-10 NOTE — ED Provider Notes (Signed)
[facility] EMERGENCY DEPARTMENT Provider Note   CSN: [id number] Arrival date & time: 02/10/17  2778     History   Chief Complaint Chief Complaint  Patient presents with  . Cough    HPI Jeffery Glenn is a 46 y.o. male.  HPI Patient presents with cough productive of yellow sputum, subjective fevers and chills, myalgias and sore throat for the past 3 days.  Denies headache or neck pain.  No known sick contacts   Past Medical History:  Diagnosis Date  . Hypertension     There are no active problems to display for this patient.   Past Surgical History:  Procedure Laterality Date  . HAND SURGERY    . NASAL SEPTUM SURGERY         Home Medications    Prior to Admission medications   Medication Sig Start Date End Date Taking? Authorizing Provider  hydrochlorothiazide (HYDRODIURIL) 25 MG tablet Take 1 tablet (25 mg total) by mouth daily. 04/03/16  Yes Jacalyn Lefevre, MD  amLODipine (NORVASC) 10 MG tablet Take 1 tablet (10 mg total) by mouth daily. 04/03/16   Jacalyn Lefevre, MD  benzonatate (TESSALON) 100 MG capsule Take 1 capsule (100 mg total) by mouth 3 (three) times daily as needed for cough. 02/10/17   Loren Racer, MD  guaifenesin (ROBITUSSIN) 100 MG/5ML syrup Take 5-10 mLs (100-200 mg total) by mouth every 4 (four) hours as needed for cough. 02/10/17   Loren Racer, MD  ibuprofen (ADVIL,MOTRIN) 600 MG tablet Take 1 tablet (600 mg total) by mouth every 6 (six) hours as needed. 02/10/17   Loren Racer, MD    Family History No family history on file.  Social History Social History   Tobacco Use  . Smoking status: Never Smoker  . Smokeless tobacco: Never Used  Substance Use Topics  . Alcohol use: Yes  . Drug use: No     Allergies   Patient has no known allergies.   Review of Systems Review of Systems  Constitutional: Positive for chills, fatigue and fever.  HENT: Positive for congestion and sore throat.   Eyes: Negative for visual  disturbance.  Respiratory: Positive for cough. Negative for shortness of breath.   Cardiovascular: Negative for chest pain and leg swelling.  Gastrointestinal: Negative for abdominal pain, constipation, diarrhea and nausea.  Musculoskeletal: Positive for myalgias. Negative for neck pain and neck stiffness.  Skin: Negative for rash and wound.  Neurological: Negative for dizziness, weakness, light-headedness, numbness and headaches.  All other systems reviewed and are negative.    Physical Exam Updated Vital Signs BP (!) 179/106 (BP Location: Right Arm)   Pulse 71   Temp 98.1 F (36.7 C) (Oral)   Resp 16   Ht 6' (1.829 m)   Wt 97.5 kg (215 lb)   SpO2 98%   BMI 29.16 kg/m   Physical Exam  Constitutional: He is oriented to person, place, and time. He appears well-developed and well-nourished.  HENT:  Head: Normocephalic and atraumatic.  Mouth/Throat: Oropharynx is clear and moist.  Oropharynx is mildly erythematous.  Uvula is midline.  No tonsillar exudates.  Eyes: EOM are normal. Pupils are equal, round, and reactive to light.  Neck: Normal range of motion. Neck supple.  No meningismus.  No cervical lymphadenopathy.  Cardiovascular: Normal rate and regular rhythm. Exam reveals no gallop and no friction rub.  No murmur heard. Pulmonary/Chest: Effort normal and breath sounds normal.  Abdominal: Soft. Bowel sounds are normal. There is no tenderness. There is  no rebound and no guarding.  Musculoskeletal: Normal range of motion. He exhibits no edema or tenderness.  No lower extremity swelling, asymmetry or tenderness.  Neurological: He is alert and oriented to person, place, and time.  Skin: Skin is warm and dry. No rash noted. No erythema.  Psychiatric: He has a normal mood and affect. His behavior is normal.  Nursing note and vitals reviewed.    ED Treatments / Results  Labs (all labs ordered are listed, but only abnormal results are displayed) Labs Reviewed - No data to  display  EKG  EKG Interpretation None       Radiology Dg Chest 2 View  Result Date: 02/10/2017 CLINICAL DATA:  Cough for several days EXAM: CHEST  2 VIEW COMPARISON:  01/25/2017 FINDINGS: The heart size and mediastinal contours are within normal limits. Both lungs are clear. The visualized skeletal structures are unremarkable. IMPRESSION: No active cardiopulmonary disease. Electronically Signed   By: Alcide CleverMark  Lukens M.D.   On: 02/10/2017 08:21    Procedures Procedures (including critical care time)  Medications Ordered in ED Medications - No data to display   Initial Impression / Assessment and Plan / ED Course  I have reviewed the triage vital signs and the nursing notes.  Pertinent labs & imaging results that were available during my care of the patient were reviewed by me and considered in my medical decision making (see chart for details).    X-ray without acute findings.  Suspect bronchitis/URI.  Will treat symptomatically.  Return precautions given.  Final Clinical Impressions(s) / ED Diagnoses   Final diagnoses:  Acute bronchitis, unspecified organism    ED Discharge Orders        Ordered    ibuprofen (ADVIL,MOTRIN) 600 MG tablet  Every 6 hours PRN     02/10/17 0851    benzonatate (TESSALON) 100 MG capsule  3 times daily PRN     02/10/17 0851    guaifenesin (ROBITUSSIN) 100 MG/5ML syrup  Every 4 hours PRN     02/10/17 0851       Loren RacerYelverton, Careen Mauch, MD 02/10/17 [phone]

## 2017-02-10 NOTE — ED Triage Notes (Signed)
Cough and bodyaches x 3 days. Taking nyquil without relief.

## 2017-02-10 NOTE — ED Notes (Signed)
Patient transported to X-ray 

## 2018-01-27 ENCOUNTER — Encounter (HOSPITAL_BASED_OUTPATIENT_CLINIC_OR_DEPARTMENT_OTHER): Payer: Self-pay | Admitting: *Deleted

## 2018-01-27 ENCOUNTER — Emergency Department (HOSPITAL_BASED_OUTPATIENT_CLINIC_OR_DEPARTMENT_OTHER)
Admission: EM | Admit: 2018-01-27 | Discharge: 2018-01-27 | Disposition: A | Discharge status: Home / Self Care | Payer: Self-pay | Attending: Emergency Medicine | Admitting: Emergency Medicine

## 2018-01-27 ENCOUNTER — Other Ambulatory Visit: Payer: Self-pay

## 2018-01-27 DIAGNOSIS — I10 Essential (primary) hypertension: Secondary | ICD-10-CM | POA: Insufficient documentation

## 2018-01-27 DIAGNOSIS — Z202 Contact with and (suspected) exposure to infections with a predominantly sexual mode of transmission: Secondary | ICD-10-CM | POA: Insufficient documentation

## 2018-01-27 DIAGNOSIS — Z79899 Other long term (current) drug therapy: Secondary | ICD-10-CM | POA: Insufficient documentation

## 2018-01-27 LAB — URINALYSIS, ROUTINE W REFLEX MICROSCOPIC
Bilirubin Urine: NEGATIVE
Glucose, UA: NEGATIVE mg/dL
Hgb urine dipstick: NEGATIVE
Ketones, ur: NEGATIVE mg/dL
LEUKOCYTES UA: NEGATIVE
NITRITE: NEGATIVE
PH: 6 (ref 5.0–8.0)
Protein, ur: NEGATIVE mg/dL
SPECIFIC GRAVITY, URINE: 1.025 (ref 1.005–1.030)

## 2018-01-27 MED ORDER — METRONIDAZOLE 500 MG PO TABS
500.0000 mg | ORAL_TABLET | Freq: Once | ORAL | Status: AC
  Administered 2018-01-27: 500 mg via ORAL
  Filled 2018-01-27: qty 1

## 2018-01-27 MED ORDER — CEFTRIAXONE SODIUM 250 MG IJ SOLR
250.0000 mg | Freq: Once | INTRAMUSCULAR | Status: AC
  Administered 2018-01-27: 250 mg via INTRAMUSCULAR
  Filled 2018-01-27: qty 250

## 2018-01-27 MED ORDER — AZITHROMYCIN 250 MG PO TABS
1000.0000 mg | ORAL_TABLET | Freq: Once | ORAL | Status: AC
  Administered 2018-01-27: 1000 mg via ORAL
  Filled 2018-01-27: qty 4

## 2018-01-27 MED ORDER — LIDOCAINE HCL (PF) 1 % IJ SOLN
INTRAMUSCULAR | Status: AC
  Administered 2018-01-27: 0.9 mL
  Filled 2018-01-27: qty 5

## 2018-01-27 MED ORDER — METRONIDAZOLE 500 MG PO TABS: 500.0000 mg | ORAL_TABLET | Freq: Two times a day (BID) | ORAL | 0 refills | Status: DC

## 2018-01-27 NOTE — Discharge Instructions (Signed)
Please read the instructions below.  Talk with your primary care provider about any new medications.  You have been treated today for gonorrhea and chlamydia. You have been given your morning dose of metronidazole to treat trichomonas. Take you second dose at bedtime. Finish antibiotics as prescribed. Do not drink alcohol with this medication as it can cause you to vomit! You will receive a call from the hospital if your test results come back positive. Avoid sexual activity until you know your test results. If your results come back positive, it is important that you inform all of your sexual partners.  Return to the ER for new or worsening symptoms.

## 2018-01-27 NOTE — ED Triage Notes (Addendum)
Girlfriend is tested + for trichomonas. No penile discharges.
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
## 2018-01-28 LAB — GC/CHLAMYDIA PROBE AMP (~~LOC~~) NOT AT ARMC
CHLAMYDIA, DNA PROBE: NEGATIVE
Chlamydia: NEGATIVE
NEISSERIA GONORRHEA: NEGATIVE
Neisseria Gonorrhea: NEGATIVE

## 2018-01-28 LAB — RPR: RPR Ser Ql: NONREACTIVE

## 2018-01-28 LAB — HIV ANTIBODY (ROUTINE TESTING W REFLEX): HIV Screen 4th Generation wRfx: NONREACTIVE

## 2018-02-18 ENCOUNTER — Emergency Department (HOSPITAL_BASED_OUTPATIENT_CLINIC_OR_DEPARTMENT_OTHER)
Admission: EM | Admit: 2018-02-18 | Discharge: 2018-02-18 | Disposition: A | Discharge status: Home / Self Care | Payer: Self-pay | Attending: Emergency Medicine | Admitting: Emergency Medicine

## 2018-02-18 ENCOUNTER — Emergency Department (HOSPITAL_BASED_OUTPATIENT_CLINIC_OR_DEPARTMENT_OTHER): Payer: Self-pay

## 2018-02-18 ENCOUNTER — Encounter (HOSPITAL_BASED_OUTPATIENT_CLINIC_OR_DEPARTMENT_OTHER): Payer: Self-pay | Admitting: Emergency Medicine

## 2018-02-18 ENCOUNTER — Other Ambulatory Visit: Payer: Self-pay

## 2018-02-18 DIAGNOSIS — I10 Essential (primary) hypertension: Secondary | ICD-10-CM | POA: Insufficient documentation

## 2018-02-18 DIAGNOSIS — R05 Cough: Secondary | ICD-10-CM | POA: Insufficient documentation

## 2018-02-18 DIAGNOSIS — R059 Cough, unspecified: Secondary | ICD-10-CM

## 2018-02-18 DIAGNOSIS — Z79899 Other long term (current) drug therapy: Secondary | ICD-10-CM | POA: Insufficient documentation

## 2018-02-18 DIAGNOSIS — M549 Dorsalgia, unspecified: Secondary | ICD-10-CM | POA: Insufficient documentation

## 2018-02-18 MED ORDER — NAPROXEN 250 MG PO TABS
500.0000 mg | ORAL_TABLET | Freq: Once | ORAL | Status: AC
  Administered 2018-02-18: 500 mg via ORAL
  Filled 2018-02-18: qty 2

## 2018-02-18 MED ORDER — CYCLOBENZAPRINE HCL 10 MG PO TABS
10.0000 mg | ORAL_TABLET | Freq: Once | ORAL | Status: AC
  Administered 2018-02-18: 10 mg via ORAL
  Filled 2018-02-18: qty 1

## 2018-02-18 MED ORDER — CYCLOBENZAPRINE HCL 10 MG PO TABS: 10.0000 mg | ORAL_TABLET | Freq: Three times a day (TID) | ORAL | 0 refills | Status: DC | PRN

## 2018-02-18 MED ORDER — NAPROXEN 375 MG PO TABS: ORAL_TABLET | ORAL | 0 refills | Status: DC

## 2018-02-18 NOTE — ED Notes (Signed)
Patient transported to X-ray 

## 2018-02-18 NOTE — ED Triage Notes (Signed)
Pt c/o 10/10 upper back pain for the past 2 days getting worse with sob and persistent cough.

## 2018-02-18 NOTE — ED Provider Notes (Signed)
[facility] Provider Note: Jeffery Dell, MD, FACEP  CSN: [id number] MRN: [id number] ARRIVAL: 02/18/18 at 0120 ROOM: [facility]07/[facility]07   CHIEF COMPLAINT  Back Pain   HISTORY OF PRESENT ILLNESS  02/18/18 2:32 AM Jeffery Glenn is a 47 y.o. male with a 2-day history of pain in his left mid back.  The pain is located to the left of his lower thoracic spine.  He describes the pain as feeling like a muscle spasm.  Pain is worse with movement of his left arm, movement of his back, coughing and deep breathing.  He feels like he needs to lean forward to breathe comfortably.  He denies chest pain.  He is having equivocal shortness of breath.  Pain is moderate to severe.  He has had a cough as well which was worse at the beginning and may have been responsible for initiating his pain.    Past Medical History:  Diagnosis Date  . Hypertension     Past Surgical History:  Procedure Laterality Date  . HAND SURGERY    . NASAL SEPTUM SURGERY      No family history on file.  Social History   Tobacco Use  . Smoking status: Never Smoker  . Smokeless tobacco: Never Used  Substance Use Topics  . Alcohol use: Yes  . Drug use: No    Prior to Admission medications   Medication Sig Start Date End Date Taking? Authorizing Provider  amLODipine (NORVASC) 10 MG tablet Take 1 tablet (10 mg total) by mouth daily. 04/03/16   Jacalyn Lefevre, MD  benzonatate (TESSALON) 100 MG capsule Take 1 capsule (100 mg total) by mouth 3 (three) times daily as needed for cough. 02/10/17   Loren Racer, MD  guaifenesin (ROBITUSSIN) 100 MG/5ML syrup Take 5-10 mLs (100-200 mg total) by mouth every 4 (four) hours as needed for cough. 02/10/17   Loren Racer, MD  hydrochlorothiazide (HYDRODIURIL) 25 MG tablet Take 1 tablet (25 mg total) by mouth daily. 04/03/16   Jacalyn Lefevre, MD  ibuprofen (ADVIL,MOTRIN) 600 MG tablet Take 1 tablet (600 mg total) by mouth every 6 (six) hours as needed. 02/10/17   Loren Racer, MD  metroNIDAZOLE (FLAGYL) 500 MG tablet Take 1 tablet (500 mg total) by mouth 2 (two) times daily. 01/27/18   Robinson, [place], PA-C    Allergies Patient has no known allergies.   REVIEW OF SYSTEMS  Negative except as noted here or in the History of Present Illness.   PHYSICAL EXAMINATION  Initial Vital Signs Blood pressure (!) 184/104, pulse 82, temperature 97.9 F (36.6 C), temperature source Oral, resp. rate 18, height 6' (1.829 m), weight 97.5 kg, SpO2 100 %.  Examination General: Well-developed, well-nourished male in no acute distress; appearance consistent with age of record HENT: normocephalic; atraumatic Eyes: pupils equal, round and reactive to light; extraocular muscles intact Neck: supple Heart: regular rate and rhythm Lungs: clear to auscultation bilaterally Abdomen: soft; nondistended; nontender; bowel sounds present Back: Palpation of left parathoracic soft tissue relieves patient's discomfort; pain on movement or deep breathing Extremities: No deformity; full range of motion; pulses normal Neurologic: Awake, alert and oriented; motor function intact in all extremities and symmetric; no facial droop Skin: Warm and dry Psychiatric: Flat affect   RESULTS  Summary of this visit's results, reviewed by myself:   EKG Interpretation  Date/Time:    Ventricular Rate:    PR Interval:    QRS Duration:   QT Interval:    QTC Calculation:  R Axis:     Text Interpretation:        Laboratory Studies: No results found for this or any previous visit (from the past 24 hour(s)). Imaging Studies: Dg Chest 2 View  Result Date: 02/18/2018 CLINICAL DATA:  Persistent cough and shortness of breath for the past 2 days. Upper back pain. EXAM: CHEST - 2 VIEW COMPARISON:  02/10/2017. FINDINGS: Interval mildly enlarged cardiac silhouette. Clear lungs with normal vascularity. Normal appearing bones. IMPRESSION: Interval mild cardiomegaly. Otherwise, unremarkable  examination. Electronically Signed   By: Beckie Salts M.D.   On: 02/18/2018 01:49    ED COURSE and MDM  Nursing notes and initial vitals signs, including pulse oximetry, reviewed.  Vitals:   02/18/18 0129 02/18/18 0149  BP: (!) 193/103 (!) 184/104  Pulse: 74 82  Resp: 17 18  Temp: 97.9 F (36.6 C)   TempSrc: Oral   SpO2: 97% 100%  Weight: 97.5 kg   Height: 6' (1.829 m)    Pain is consistent with musculoskeletal pain associated with coughing.  He has been taking Tylenol cold and flu without relief of the pain.  We will treat with an NSAID and muscle relaxant.  PROCEDURES    ED DIAGNOSES     ICD-10-CM   1. Mid back pain on left side M54.9   2. Cough R05        Gracynn Rajewski, Jonny Ruiz, MD 02/18/18 [phone]

## 2018-03-20 ENCOUNTER — Other Ambulatory Visit: Payer: Self-pay

## 2018-03-20 ENCOUNTER — Emergency Department (HOSPITAL_BASED_OUTPATIENT_CLINIC_OR_DEPARTMENT_OTHER): Payer: Self-pay

## 2018-03-20 ENCOUNTER — Encounter (HOSPITAL_BASED_OUTPATIENT_CLINIC_OR_DEPARTMENT_OTHER): Payer: Self-pay | Admitting: Emergency Medicine

## 2018-03-20 ENCOUNTER — Emergency Department (HOSPITAL_BASED_OUTPATIENT_CLINIC_OR_DEPARTMENT_OTHER)
Admission: EM | Admit: 2018-03-20 | Discharge: 2018-03-20 | Disposition: A | Discharge status: Home / Self Care | Payer: Self-pay | Attending: Emergency Medicine | Admitting: Emergency Medicine

## 2018-03-20 DIAGNOSIS — Y929 Unspecified place or not applicable: Secondary | ICD-10-CM | POA: Insufficient documentation

## 2018-03-20 DIAGNOSIS — I10 Essential (primary) hypertension: Secondary | ICD-10-CM | POA: Insufficient documentation

## 2018-03-20 DIAGNOSIS — S41032A Puncture wound without foreign body of left shoulder, initial encounter: Secondary | ICD-10-CM | POA: Insufficient documentation

## 2018-03-20 DIAGNOSIS — T148XXA Other injury of unspecified body region, initial encounter: Secondary | ICD-10-CM

## 2018-03-20 DIAGNOSIS — Y939 Activity, unspecified: Secondary | ICD-10-CM | POA: Insufficient documentation

## 2018-03-20 DIAGNOSIS — Y999 Unspecified external cause status: Secondary | ICD-10-CM | POA: Insufficient documentation

## 2018-03-20 DIAGNOSIS — Z79899 Other long term (current) drug therapy: Secondary | ICD-10-CM | POA: Insufficient documentation

## 2018-03-20 LAB — COMPREHENSIVE METABOLIC PANEL
ALK PHOS: 37 U/L — AB (ref 38–126)
ALT: 14 U/L (ref 0–44)
ANION GAP: 10 (ref 5–15)
AST: 14 U/L — ABNORMAL LOW (ref 15–41)
Albumin: 4.5 g/dL (ref 3.5–5.0)
BUN: 15 mg/dL (ref 6–20)
CHLORIDE: 105 mmol/L (ref 98–111)
CO2: 25 mmol/L (ref 22–32)
CREATININE: 0.98 mg/dL (ref 0.61–1.24)
Calcium: 9.5 mg/dL (ref 8.9–10.3)
GFR calc non Af Amer: >60 mL/min (ref >60)
Glucose, Bld: 139 mg/dL — ABNORMAL HIGH (ref 70–99)
Potassium: 3.2 mmol/L — ABNORMAL LOW (ref 3.5–5.1)
SODIUM: 140 mmol/L (ref 135–145)
Total Bilirubin: 0.5 mg/dL (ref 0.3–1.2)
Total Protein: 8.3 g/dL — ABNORMAL HIGH (ref 6.5–8.1)

## 2018-03-20 LAB — CBC WITH DIFFERENTIAL/PLATELET
ABS IMMATURE GRANULOCYTES: 0.01 10*3/uL (ref 0.00–0.07)
BASOS ABS: 0 10*3/uL (ref 0.0–0.1)
BASOS PCT: 0 %
EOS ABS: 0.2 10*3/uL (ref 0.0–0.5)
EOS PCT: 4 %
HEMATOCRIT: 44 % (ref 39.0–52.0)
HEMOGLOBIN: 13.7 g/dL (ref 13.0–17.0)
Immature Granulocytes: 0 %
Lymphocytes Relative: 52 %
Lymphs Abs: 2.4 10*3/uL (ref 0.7–4.0)
MCH: 26.2 pg (ref 26.0–34.0)
MCHC: 31.1 g/dL (ref 30.0–36.0)
MCV: 84.3 fL (ref 80.0–100.0)
Monocytes Absolute: 0.4 10*3/uL (ref 0.1–1.0)
Monocytes Relative: 8 %
NEUTROS PCT: 36 %
NRBC: 0 % (ref 0.0–0.2)
Neutro Abs: 1.7 10*3/uL (ref 1.7–7.7)
Platelets: 172 10*3/uL (ref 150–400)
RBC: 5.22 MIL/uL (ref 4.22–5.81)
RDW: 14.6 % (ref 11.5–15.5)
WBC: 4.7 10*3/uL (ref 4.0–10.5)

## 2018-03-20 LAB — PROTIME-INR
INR: 1.02
Prothrombin Time: 13.3 seconds (ref 11.4–15.2)

## 2018-03-20 MED ORDER — SODIUM CHLORIDE 0.9 % IV BOLUS
1000.0000 mL | Freq: Once | INTRAVENOUS | Status: AC
  Administered 2018-03-20: 1000 mL via INTRAVENOUS

## 2018-03-20 MED ORDER — DIPHENHYDRAMINE HCL 50 MG/ML IJ SOLN
12.5000 mg | Freq: Once | INTRAMUSCULAR | Status: AC
  Administered 2018-03-20: 12.5 mg via INTRAVENOUS
  Filled 2018-03-20: qty 1

## 2018-03-20 MED ORDER — CEFAZOLIN SODIUM-DEXTROSE 1-4 GM/50ML-% IV SOLN
1.0000 g | Freq: Once | INTRAVENOUS | Status: AC
  Administered 2018-03-20: 1 g via INTRAVENOUS

## 2018-03-20 MED ORDER — MELOXICAM 15 MG PO TABS: 15.0000 mg | ORAL_TABLET | Freq: Every day | ORAL | 0 refills | Status: DC

## 2018-03-20 MED ORDER — DOXYCYCLINE HYCLATE 100 MG PO CAPS: 100.0000 mg | ORAL_CAPSULE | Freq: Two times a day (BID) | ORAL | 0 refills | Status: DC

## 2018-03-20 MED ORDER — FENTANYL CITRATE (PF) 100 MCG/2ML IJ SOLN
INTRAMUSCULAR | Status: AC
  Filled 2018-03-20: qty 2

## 2018-03-20 MED ORDER — IOPAMIDOL (ISOVUE-370) INJECTION 76%
100.0000 mL | Freq: Once | INTRAVENOUS | Status: AC
  Administered 2018-03-20: 100 mL via INTRAVENOUS

## 2018-03-20 MED ORDER — FENTANYL CITRATE (PF) 100 MCG/2ML IJ SOLN
50.0000 ug | Freq: Once | INTRAMUSCULAR | Status: AC
  Administered 2018-03-20: 50 ug via INTRAVENOUS

## 2018-03-20 MED ORDER — CEFAZOLIN SODIUM-DEXTROSE 1-4 GM/50ML-% IV SOLN
INTRAVENOUS | Status: AC
  Filled 2018-03-20: qty 50

## 2018-03-20 MED ORDER — DEXAMETHASONE SODIUM PHOSPHATE 10 MG/ML IJ SOLN
INTRAMUSCULAR | Status: AC
  Filled 2018-03-20: qty 1

## 2018-03-20 MED ORDER — DEXAMETHASONE SODIUM PHOSPHATE 10 MG/ML IJ SOLN
10.0000 mg | Freq: Once | INTRAMUSCULAR | Status: AC
  Administered 2018-03-20: 10 mg via INTRAVENOUS

## 2018-03-20 MED ORDER — LIDOCAINE-EPINEPHRINE-TETRACAINE (LET) SOLUTION
NASAL | Status: AC
  Filled 2018-03-20: qty 3

## 2018-03-20 MED ORDER — CEPHALEXIN 500 MG PO CAPS: 500.0000 mg | ORAL_CAPSULE | Freq: Four times a day (QID) | ORAL | 0 refills | Status: DC
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
## 2018-03-20 NOTE — ED Notes (Signed)
Patient left at this time with all belongings. 
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
## 2018-03-20 NOTE — ED Notes (Signed)
Patient transported to CT 
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
## 2018-05-06 ENCOUNTER — Inpatient Hospital Stay (HOSPITAL_BASED_OUTPATIENT_CLINIC_OR_DEPARTMENT_OTHER)
Admission: EM | Admit: 2018-05-06 | Discharge: 2018-05-06 | DRG: 292 | Discharge status: Against Medical Advice | Payer: Self-pay | Attending: Emergency Medicine | Admitting: Emergency Medicine

## 2018-05-06 ENCOUNTER — Emergency Department (HOSPITAL_BASED_OUTPATIENT_CLINIC_OR_DEPARTMENT_OTHER): Payer: Self-pay

## 2018-05-06 ENCOUNTER — Encounter (HOSPITAL_BASED_OUTPATIENT_CLINIC_OR_DEPARTMENT_OTHER): Payer: Self-pay | Admitting: Emergency Medicine

## 2018-05-06 ENCOUNTER — Other Ambulatory Visit: Payer: Self-pay

## 2018-05-06 DIAGNOSIS — Z79899 Other long term (current) drug therapy: Secondary | ICD-10-CM

## 2018-05-06 DIAGNOSIS — J810 Acute pulmonary edema: Secondary | ICD-10-CM

## 2018-05-06 DIAGNOSIS — Z8249 Family history of ischemic heart disease and other diseases of the circulatory system: Secondary | ICD-10-CM

## 2018-05-06 DIAGNOSIS — R944 Abnormal results of kidney function studies: Secondary | ICD-10-CM | POA: Diagnosis present

## 2018-05-06 DIAGNOSIS — I110 Hypertensive heart disease with heart failure: Principal | ICD-10-CM | POA: Diagnosis present

## 2018-05-06 DIAGNOSIS — J811 Chronic pulmonary edema: Secondary | ICD-10-CM | POA: Diagnosis present

## 2018-05-06 DIAGNOSIS — Z791 Long term (current) use of non-steroidal anti-inflammatories (NSAID): Secondary | ICD-10-CM

## 2018-05-06 DIAGNOSIS — I5042 Chronic combined systolic (congestive) and diastolic (congestive) heart failure: Secondary | ICD-10-CM

## 2018-05-06 DIAGNOSIS — I5020 Unspecified systolic (congestive) heart failure: Secondary | ICD-10-CM

## 2018-05-06 DIAGNOSIS — I509 Heart failure, unspecified: Secondary | ICD-10-CM | POA: Diagnosis present

## 2018-05-06 DIAGNOSIS — Z91013 Allergy to seafood: Secondary | ICD-10-CM

## 2018-05-06 HISTORY — DX: Heart failure, unspecified: I50.9

## 2018-05-06 LAB — CBC WITH DIFFERENTIAL/PLATELET
Abs Immature Granulocytes: 0 10*3/uL (ref 0.00–0.07)
Basophils Absolute: 0 10*3/uL (ref 0.0–0.1)
Basophils Relative: 1 %
Eosinophils Absolute: 0.1 10*3/uL (ref 0.0–0.5)
Eosinophils Relative: 3 %
HCT: 44.9 % (ref 39.0–52.0)
Hemoglobin: 14.1 g/dL (ref 13.0–17.0)
Immature Granulocytes: 0 %
Lymphocytes Relative: 38 %
Lymphs Abs: 1.5 10*3/uL (ref 0.7–4.0)
MCH: 26.2 pg (ref 26.0–34.0)
MCHC: 31.4 g/dL (ref 30.0–36.0)
MCV: 83.5 fL (ref 80.0–100.0)
Monocytes Absolute: 0.4 10*3/uL (ref 0.1–1.0)
Monocytes Relative: 11 %
Neutro Abs: 1.8 10*3/uL (ref 1.7–7.7)
Neutrophils Relative %: 47 %
Platelets: 166 10*3/uL (ref 150–400)
RBC: 5.38 MIL/uL (ref 4.22–5.81)
RDW: 15.5 % (ref 11.5–15.5)
Smear Review: NORMAL
WBC: 3.9 10*3/uL — ABNORMAL LOW (ref 4.0–10.5)
nRBC: 0 % (ref 0.0–0.2)

## 2018-05-06 LAB — COMPREHENSIVE METABOLIC PANEL
ALT: 36 U/L (ref 0–44)
AST: 28 U/L (ref 15–41)
Albumin: 4.4 g/dL (ref 3.5–5.0)
Alkaline Phosphatase: 36 U/L — ABNORMAL LOW (ref 38–126)
Anion gap: 6 (ref 5–15)
BUN: 17 mg/dL (ref 6–20)
CALCIUM: 9 mg/dL (ref 8.9–10.3)
CO2: 25 mmol/L (ref 22–32)
Chloride: 108 mmol/L (ref 98–111)
Creatinine, Ser: 0.91 mg/dL (ref 0.61–1.24)
GFR calc Af Amer: >60 mL/min (ref >60)
GFR calc non Af Amer: >60 mL/min (ref >60)
Glucose, Bld: 111 mg/dL — ABNORMAL HIGH (ref 70–99)
Potassium: 3.9 mmol/L (ref 3.5–5.1)
Sodium: 139 mmol/L (ref 135–145)
Total Bilirubin: 1.2 mg/dL (ref 0.3–1.2)
Total Protein: 7.8 g/dL (ref 6.5–8.1)

## 2018-05-06 LAB — BRAIN NATRIURETIC PEPTIDE: B Natriuretic Peptide: 357.3 pg/mL — ABNORMAL HIGH (ref 0.0–100.0)

## 2018-05-06 LAB — TROPONIN I: Troponin I: 0.04 ng/mL (ref <0.03)

## 2018-05-06 LAB — D-DIMER, QUANTITATIVE (NOT AT ARMC): D DIMER QUANT: 0.92 ug{FEU}/mL — AB (ref 0.00–0.50)

## 2018-05-06 MED ORDER — IOPAMIDOL (ISOVUE-370) INJECTION 76%
100.0000 mL | Freq: Once | INTRAVENOUS | Status: AC | PRN
  Administered 2018-05-06: 82 mL via INTRAVENOUS

## 2018-05-06 MED ORDER — FUROSEMIDE 10 MG/ML IJ SOLN
40.0000 mg | INTRAMUSCULAR | Status: AC
  Administered 2018-05-06: 40 mg via INTRAVENOUS
  Filled 2018-05-06: qty 4

## 2018-05-06 MED ORDER — NITROGLYCERIN 2 % TD OINT
1.0000 [in_us] | TOPICAL_OINTMENT | Freq: Once | TRANSDERMAL | Status: AC
  Administered 2018-05-06: 1 [in_us] via TOPICAL
  Filled 2018-05-06: qty 1

## 2018-05-06 NOTE — ED Notes (Addendum)
Called into patients room. Advised patient that transport should be here within the hour. Pt requesting to sign out AMA at this time. Pt sts that he will follow up with his primary. Will make MD aware.

## 2018-05-06 NOTE — Progress Notes (Signed)
48 year old male with past medical history significant for hypertension presenting with cardiomegaly and pulmonary edema.  Patient has no history of congestive heart failure.  Creatinine is mildly elevated.  I accepted to telemetry for diuresis and diagnosis.

## 2018-05-06 NOTE — ED Triage Notes (Signed)
SOB x 2 weeks , cough as resolved

## 2018-05-06 NOTE — ED Notes (Signed)
ED Provider at bedside. 

## 2018-05-06 NOTE — ED Notes (Signed)
Secretary advised that transport would be here within the hour.

## 2018-05-06 NOTE — ED Provider Notes (Signed)
I was called to the patient's room because he no longer wanted to be admitted to the hospital.  I had assumed patient care at 1600 at change of shift from Dr. Clarene Duke.  Briefly the patient is a 48 year old male with shortness of breath on exertion.  He is being admitted to the hospital with a positive troponin and a mildly elevated BNP as well as concern for pulmonary edema on his CT angiogram of the chest.  I discussed this with the patient who is frustrated that he has had to wait so long for transport from here to [facility]  He is unwilling to wait any longer.  I discussed with him at length that this may kill him or leave him permanently disabled, he was able to repeat this back to me.  I believe he is of sound mind.  We will have him return to the ED for any worsening or if he changes his mind.  I have given him cardiologist follow-up.   Melene Plan, DO 05/06/18 1659

## 2018-05-06 NOTE — Discharge Instructions (Signed)
You have a positive troponin.  This could mean that you are having a heart attack.  This could kill you or leave you disabled.  Please return to the emergency department for any worsening symptoms or even if you change your mind and decide that you now want to stay in the hospital.  If not please follow-up with your primary care provider and the cardiologist as soon as you can.

## 2018-05-06 NOTE — ED Notes (Signed)
Dr Adela Lank to bedside to discuss risks and benefits with patient at this time. Pt signed AMA form and witnessed by this RN.

## 2018-05-06 NOTE — ED Provider Notes (Signed)
[facility] EMERGENCY DEPARTMENT Provider Note   CSN: [id number] Arrival date & time: 05/06/18  0901    History   Chief Complaint Chief Complaint  Patient presents with  . Shortness of Breath    HPI Jeffery Glenn is a 48 y.o. male.     48yo M w/ PMH including HTN who p/w SOB. He reports 2 weeks of SOB that is worse with exertion, improves with rest. In the beginning, he had a few days of cough but this resolved; no current cough, fevers, or other URI symptoms. He endorses PND but no orthopnea or LE edema. No chest pain. He's tried an inhaler without relief. No recent, h/o blood clots, drug use, or tobacco use. He is compliant with HTN medication.  FH notable for mother who died at 58 of MI, father died of MI at ~88.   The history is provided by the patient.  Shortness of Breath    Past Medical History:  Diagnosis Date  . Hypertension     There are no active problems to display for this patient.   Past Surgical History:  Procedure Laterality Date  . HAND SURGERY    . NASAL SEPTUM SURGERY          Home Medications    Prior to Admission medications   Medication Sig Start Date End Date Taking? Authorizing Provider  amLODipine (NORVASC) 10 MG tablet Take 1 tablet (10 mg total) by mouth daily. 04/03/16   Jacalyn Lefevre, MD  cephALEXin (KEFLEX) 500 MG capsule Take 1 capsule (500 mg total) by mouth 4 (four) times daily. 03/20/18   Palumbo, April, MD  cyclobenzaprine (FLEXERIL) 10 MG tablet Take 1 tablet (10 mg total) by mouth 3 (three) times daily as needed for muscle spasms. 02/18/18   Molpus, John, MD  doxycycline (VIBRAMYCIN) 100 MG capsule Take 1 capsule (100 mg total) by mouth 2 (two) times daily. One po bid x 7 days 03/20/18   Palumbo, April, MD  hydrochlorothiazide (HYDRODIURIL) 25 MG tablet Take 1 tablet (25 mg total) by mouth daily. 04/03/16   Jacalyn Lefevre, MD  meloxicam (MOBIC) 15 MG tablet Take 1 tablet (15 mg total) by mouth daily. 03/20/18   Palumbo,  April, MD  naproxen (NAPROSYN) 375 MG tablet Take 1 tablet twice daily as needed for pain. 02/18/18   Molpus, John, MD    Family History No family history on file.  Social History Social History   Tobacco Use  . Smoking status: Never Smoker  . Smokeless tobacco: Never Used  Substance Use Topics  . Alcohol use: Yes  . Drug use: No     Allergies   Shellfish allergy   Review of Systems Review of Systems  Respiratory: Positive for shortness of breath.    All other systems reviewed and are negative except that which was mentioned in HPI   Physical Exam Updated Vital Signs BP (!) 172/112 (BP Location: Right Arm)   Pulse 88   Temp 98 F (36.7 C) (Oral)   Resp 18   Ht 6' (1.829 m)   Wt 104.3 kg   SpO2 100%   BMI 31.19 kg/m   Physical Exam Vitals signs and nursing note reviewed.  Constitutional:      General: He is not in acute distress.    Appearance: He is well-developed.  HENT:     Head: Normocephalic and atraumatic.     Mouth/Throat:     Mouth: Mucous membranes are moist.     Pharynx: Oropharynx  is clear.  Eyes:     Conjunctiva/sclera: Conjunctivae normal.  Neck:     Musculoskeletal: Neck supple.  Cardiovascular:     Rate and Rhythm: Normal rate and regular rhythm.     Heart sounds: Murmur present.     Comments: S2 click Pulmonary:     Effort: Pulmonary effort is normal.     Comments: Diminished b/l, no wheezing or crackles Abdominal:     General: Bowel sounds are normal. There is no distension.     Palpations: Abdomen is soft.     Tenderness: There is no abdominal tenderness.  Musculoskeletal:     Right lower leg: No edema.     Left lower leg: No edema.  Skin:    General: Skin is warm and dry.  Neurological:     Mental Status: He is alert and oriented to person, place, and time.     Comments: Fluent speech  Psychiatric:        Judgment: Judgment normal.      ED Treatments / Results  Labs (all labs ordered are listed, but only abnormal  results are displayed) Labs Reviewed  COMPREHENSIVE METABOLIC PANEL - Abnormal; Notable for the following components:      Result Value   Glucose, Bld 111 (*)    Alkaline Phosphatase 36 (*)    All other components within normal limits  CBC WITH DIFFERENTIAL/PLATELET - Abnormal; Notable for the following components:   WBC 3.9 (*)    All other components within normal limits  BRAIN NATRIURETIC PEPTIDE - Abnormal; Notable for the following components:   B Natriuretic Peptide 357.3 (*)    All other components within normal limits  D-DIMER, QUANTITATIVE (NOT AT [facility]) - Abnormal; Notable for the following components:   D-Dimer, Quant 0.92 (*)    All other components within normal limits  TROPONIN I - Abnormal; Notable for the following components:   Troponin I 0.04 (*)    All other components within normal limits    EKG EKG Interpretation  Date/Time:  Friday May 06 2018 09:44:13 EST Ventricular Rate:  91 PR Interval:    QRS Duration: 101 QT Interval:  399 QTC Calculation: 491 R Axis:   172 Text Interpretation:  Sinus rhythm Biatrial enlargement Left posterior fascicular block Abnormal R-wave progression, late transition Abnormal T, consider ischemia, lateral leads atrial enlargement, T wave inversions inferiorly and V5-V6 new from previous Confirmed by Frederick Peers [phone]) on 05/06/2018 10:07:49 AM   Radiology Dg Chest 2 View  Result Date: 05/06/2018 CLINICAL DATA:  Shortness of breath for 2 weeks. EXAM: CHEST - 2 VIEW COMPARISON:  Single-view of the chest 03/20/2018. PA and lateral chest 02/18/2018. FINDINGS: There is cardiomegaly and mild interstitial edema. No pneumothorax or pleural effusion. No acute or focal bony abnormality. IMPRESSION: Cardiomegaly and interstitial edema. Electronically Signed   By: Drusilla Kanner M.D.   On: 05/06/2018 09:43   Ct Angio Chest Pe W/cm &/or Wo Cm  Result Date: 05/06/2018 CLINICAL DATA:  Chest pain and shortness of breath for the past 2  weeks. Elevated D-dimer. Evaluate for DVT. EXAM: CT ANGIOGRAPHY CHEST WITH CONTRAST TECHNIQUE: Multidetector CT imaging of the chest was performed using the standard protocol during bolus administration of intravenous contrast. Multiplanar CT image reconstructions and MIPs were obtained to evaluate the vascular anatomy. CONTRAST:  9mL ISOVUE-370 IOPAMIDOL (ISOVUE-370) INJECTION 76% COMPARISON:  Chest radiograph-05/06/2018; 03/20/2018 FINDINGS: Vascular Findings: There is adequate opacification of the pulmonary arterial system with the main pulmonary artery measuring 373  Hounsfield units. There are no discrete filling defects within the pulmonary arterial tree to suggest pulmonary embolism. Normal caliber of the main pulmonary artery. Cardiomegaly, in particular, there is enlargement left ventricle and atrium. No pericardial effusion. Normal caliber of the thoracic aorta. Conventional configuration of the aortic arch. Review of the MIP images confirms the above findings. ---------------------------------------------------------------------------------- Nonvascular Findings: Mediastinum/Lymph Nodes: Scattered mediastinal lymph nodes are not enlarged by size criteria with index AP window lymph node measuring 0.9 cm in greatest short axis diameter (image 34, series 4) and index pretracheal lymph node measuring 0.8 cm (image 34), presumably reactive in etiology. Partially calcified left hilar lymph nodes, the sequela of prior granulomatous infection. No bulky mediastinal, hilar or axillary lymphadenopathy. Lungs/Pleura: Rather diffuse though apical predominant interstitial thickening. Minimal dependent subpleural ground-glass atelectasis, right greater than left. No discrete focal airspace opacities. No air bronchograms. The central pulmonary airways appear patent however there is diffuse bronchial wall thickening, most conspicuously about the bilateral hila and involving the bilateral lower lobe bronchi. There is a  minimal amount of thickening about the bilateral fissures without pleural effusion. No pneumothorax. No discrete pulmonary nodules given limitation of the examination. Upper abdomen: Limited early arterial phase evaluation of the upper abdomen demonstrates mild nodularity of the hepatic contour with mild reflux of contrast into the intrahepatic venous system and intrahepatic IVC. Musculoskeletal: No acute or aggressive osseous abnormalities. Moderate DDD of C6-C7 is suspected though incompletely evaluated. Mild diffuse body wall anasarca. Normal appearance of the thyroid gland. Symmetric bilateral gynecomastia. IMPRESSION: 1. No evidence of pulmonary embolism. 2. Cardiomegaly with most findings suggestive of superimposed pulmonary edema. Further evaluation cardiac echo could be performed as clinically indicated. 3. Diffuse though perihilar predominant bronchial wall thickening, nonspecific though could be seen in the setting of airways disease/bronchitis. No discrete focal airspace opacities or air bronchograms. 4. Potential mild nodularity of the hepatic contour as could be seen in the setting of early cirrhotic change. Further evaluation with LFTs could be performed as indicated Electronically Signed   By: Simonne Come M.D.   On: 05/06/2018 11:40    Procedures .Critical Care Performed by: Laurence Spates, MD Authorized by: Laurence Spates, MD   Critical care provider statement:    Critical care time (minutes):  30   Critical care time was exclusive of:  Separately billable procedures and treating other patients   Critical care was necessary to treat or prevent imminent or life-threatening deterioration of the following conditions:  Cardiac failure   Critical care was time spent personally by me on the following activities:  Development of treatment plan with patient or surrogate, evaluation of patient's response to treatment, examination of patient, obtaining history from patient or  surrogate, review of old charts, re-evaluation of patient's condition, ordering and performing treatments and interventions, ordering and review of laboratory studies and ordering and review of radiographic studies   (including critical care time)  Medications Ordered in ED Medications  iopamidol (ISOVUE-370) 76 % injection 100 mL (82 mLs Intravenous Contrast Given 05/06/18 1118)  nitroGLYCERIN (NITROGLYN) 2 % ointment 1 inch (1 inch Topical Given 05/06/18 1220)  furosemide (LASIX) injection 40 mg (40 mg Intravenous Given 05/06/18 1218)     Initial Impression / Assessment and Plan / ED Course  I have reviewed the triage vital signs and the nursing notes.  Pertinent labs & imaging results that were available during my care of the patient were reviewed by me and considered in my medical decision making (see chart  for details).        Pt non-toxic, normal O2 sat, hypertensive at 174/123. No chest pain. EKG shows new inferior and lateral T wave inversions compared to old tracing. Labs show normal creatinine, trop 0.04, normal Hgb. BNP 357, D-dimer 0.92. CTA chest negative for PE but shows cardiomegaly and pulmonary edema.  He has remained severely hypertensive here, have ordered Nitropaste and IV Lasix.  I suspect that his positive troponin is more related to demand ischemia rather than ACS therefore will hold on heparin for now.  I recommended admission given positive troponin, hypertension, and new diagnosis of CHF.  Discussed with Dr. Sharyon Medicus and pt transferred for further care.  Final Clinical Impressions(s) / ED Diagnoses   Final diagnoses:  Acute congestive heart failure, unspecified heart failure type (HCC)  Acute pulmonary edema [facility])    ED Discharge Orders    None       Lucelia Lacey, Ambrose Finland, MD 05/06/18 1228

## 2018-05-06 NOTE — ED Notes (Signed)
Pt on auto VS  

## 2018-05-06 NOTE — ED Notes (Signed)
Report received from [place], [place]. Awaiting transport at this time for admission to [facility].

## 2018-05-13 DIAGNOSIS — I509 Heart failure, unspecified: Secondary | ICD-10-CM | POA: Insufficient documentation

## 2018-05-17 ENCOUNTER — Encounter: Payer: Self-pay | Admitting: Cardiology

## 2018-05-17 ENCOUNTER — Ambulatory Visit (INDEPENDENT_AMBULATORY_CARE_PROVIDER_SITE_OTHER): Payer: Self-pay | Admitting: Cardiology

## 2018-05-17 VITALS — BP 130/86 | HR 80 | Ht 72.0 in | Wt 230.1 lb

## 2018-05-17 DIAGNOSIS — I10 Essential (primary) hypertension: Secondary | ICD-10-CM

## 2018-05-17 DIAGNOSIS — R0600 Dyspnea, unspecified: Secondary | ICD-10-CM

## 2018-05-17 DIAGNOSIS — R0609 Other forms of dyspnea: Secondary | ICD-10-CM

## 2018-05-17 DIAGNOSIS — R0789 Other chest pain: Secondary | ICD-10-CM

## 2018-05-17 DIAGNOSIS — I509 Heart failure, unspecified: Secondary | ICD-10-CM

## 2018-05-17 HISTORY — DX: Essential (primary) hypertension: I10

## 2018-05-17 HISTORY — DX: Other chest pain: R07.89

## 2018-05-17 HISTORY — DX: Dyspnea, unspecified: R06.00

## 2018-05-17 HISTORY — DX: Other forms of dyspnea: R06.09

## 2018-05-17 NOTE — Patient Instructions (Signed)
Medication Instructions:   Your physician recommends that you continue on your current medications as directed. Please refer to the Current Medication list given to you today.   If you need a refill on your cardiac medications before your next appointment, please call your pharmacy.   Lab work:  NONE  Testing/Procedures:  Your physician has requested that you have an echocardiogram. Echocardiography is a painless test that uses sound waves to create images of your heart. It provides your doctor with information about the size and shape of your heart and how well your heart's chambers and valves are working. This procedure takes approximately one hour. There are no restrictions for this procedure.   Echocardiogram An echocardiogram is a procedure that uses painless sound waves (ultrasound) to produce an image of the heart. Images from an echocardiogram can provide important information about:  Signs of coronary artery disease (CAD).  Aneurysm detection. An aneurysm is a weak or damaged part of an artery wall that bulges out from the normal force of blood pumping through the body.  Heart size and shape. Changes in the size or shape of the heart can be associated with certain conditions, including heart failure, aneurysm, and CAD.  Heart muscle function.  Heart valve function.  Signs of a past heart attack.  Fluid buildup around the heart.  Thickening of the heart muscle.  A tumor or infectious growth around the heart valves. Tell a health care provider about:  Any allergies you have.  All medicines you are taking, including vitamins, herbs, eye drops, creams, and over-the-counter medicines.  Any blood disorders you have.  Any surgeries you have had.  Any medical conditions you have.  Whether you are pregnant or may be pregnant. What are the risks? Generally, this is a safe procedure. However, problems may occur, including:  Allergic reaction to dye (contrast) that  may be used during the procedure. What happens before the procedure? No specific preparation is needed. You may eat and drink normally. What happens during the procedure?   An IV tube may be inserted into one of your veins.  You may receive contrast through this tube. A contrast is an injection that improves the quality of the pictures from your heart.  A gel will be applied to your chest.  A wand-like tool (transducer) will be moved over your chest. The gel will help to transmit the sound waves from the transducer.  The sound waves will harmlessly bounce off of your heart to allow the heart images to be captured in real-time motion. The images will be recorded on a computer. The procedure may vary among health care providers and hospitals. What happens after the procedure?  You may return to your normal, everyday life, including diet, activities, and medicines, unless your health care provider tells you not to do that. Summary  An echocardiogram is a procedure that uses painless sound waves (ultrasound) to produce an image of the heart.  Images from an echocardiogram can provide important information about the size and shape of your heart, heart muscle function, heart valve function, and fluid buildup around your heart.  You do not need to do anything to prepare before this procedure. You may eat and drink normally.  After the echocardiogram is completed, you may return to your normal, everyday life, unless your health care provider tells you not to do that. This information is not intended to replace advice given to you by your health care provider. Make sure you discuss any questions  you have with your health care provider. Document Released: 02/21/2000 Document Revised: 03/28/2016 Document Reviewed: 03/28/2016 Elsevier Interactive Patient Education  2019 [employer].   Follow-Up: At [facility], you and your health needs are our priority.  As part of our continuing mission  to provide you with exceptional heart care, we have created designated Provider Care Teams.  These Care Teams include your primary Cardiologist (physician) and Advanced Practice Providers (APPs -  Physician Assistants and Nurse Practitioners) who all work together to provide you with the care you need, when you need it.   . You will need a follow up appointment in 1 months.

## 2018-05-17 NOTE — Progress Notes (Signed)
Cardiology Consultation:    Date:  05/17/2018   ID:  Jeffery Glenn, DOB [birth year], MRN [id number]  PCP:  [facility]  Cardiologist:  Gypsy Balsam, MD   Referring MD: [facility]   Chief Complaint  Patient presents with  . ER follow up  Left shortness of breath  History of Present Illness:    [name] is a 48 y.o. male who is being seen today for the evaluation of this of breath at the request of [facility]  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  He was identified to have very high blood pressure he was given diuretic and appropriate medication for blood pressure has been initiated and then he was offered hospitalization for clarification of the diagnosis however he refused he sign AMA since that time he seems to be doing well denies have any chest pain tightness squeezing pressure burning chest recently however he described to have some episode of uneasiness in the chest previously did happen sometimes with emotional stress.  Sometimes that sensation can happen at rest last usually for few minutes not too severe there is no shortness of breath no sweating associated with this sensation. Does not exercise on a regular basis Does not smoke Has multiple family members with premature coronary artery disease Was recognized to have high blood pressure just recently. He does not have swelling of lower extremities does not have paroxysmal nocturnal dyspnea.  Has to get up in the middle of the night once or twice to urinate which is nothing unusual for him  Past Medical History:  Diagnosis Date  . Congestive heart failure (CHF) (HCC) 05/06/2018  . Hypertension     Past Surgical History:  Procedure  Laterality Date  . HAND SURGERY    . NASAL SEPTUM SURGERY      Current Medications: Current Meds  Medication Sig  . amLODipine (NORVASC) 10 MG tablet Take 1 tablet (10 mg total) by mouth daily.  . hydrochlorothiazide (HYDRODIURIL) 25 MG tablet Take 1 tablet (25 mg total) by mouth daily.  . meloxicam (MOBIC) 15 MG tablet Take 1 tablet (15 mg total) by mouth daily.     Allergies:   Shellfish allergy and Peanut-containing drug products   Social History   Socioeconomic History  . Marital status: Married    Spouse name: Not on file  . Number of children: Not on file  . Years of education: Not on file  . Highest education level: Not on file  Occupational History  . Not on file  Social Needs  . Financial resource strain: Not on file  . Food insecurity:    Worry: Not on file    Inability: Not on file  . Transportation needs:    Medical: Not on file    Non-medical: Not on file  Tobacco Use  . Smoking status: Never Smoker  . Smokeless tobacco: Never Used  Substance and Sexual Activity  . Alcohol use: Yes  . Drug use: No  . Sexual activity: Not on file  Lifestyle  . Physical activity:    Days per week: Not on file    Minutes per session: Not on file  . Stress: Not on file  Relationships  .  Social connections:    Talks on phone: Not on file    Gets together: Not on file    Attends religious service: Not on file    Active member of club or organization: Not on file    Attends meetings of clubs or organizations: Not on file    Relationship status: Not on file  Other Topics Concern  . Not on file  Social History Narrative  . Not on file     Family History: The patient's family history includes Heart attack in his father and mother; Hypertension in his father and mother. ROS:   Please see the history of present illness.    All 14 point review of systems negative except as described per history of present illness.  EKGs/Labs/Other Studies Reviewed:    The following  studies were reviewed today: EKG done in the emergency room on 28 February showed normal sinus rhythm possible biatrial enlargement incomplete right bundle branch block.  Poor R wave progression anterior precordium some T inversion in lead V5 V6.  Abnormal EKG  CT of the chest with contrast done on 28 February while in the emergency room: IMPRESSION: 1. No evidence of pulmonary embolism. 2. Cardiomegaly with most findings suggestive of superimposed pulmonary edema. Further evaluation cardiac echo could be performed as clinically indicated. 3. Diffuse though perihilar predominant bronchial wall thickening, nonspecific though could be seen in the setting of airways disease/bronchitis. No discrete focal airspace opacities or air bronchograms. 4. Potential mild nodularity of the hepatic contour as could be seen in the setting of early cirrhotic change. Further evaluation with LFTs could be performed as indicated  Recent Labs: 05/06/2018: ALT 36; B Natriuretic Peptide 357.3; BUN 17; Creatinine, Ser 0.91; Hemoglobin 14.1; Platelets 166; Potassium 3.9; Sodium 139  Recent Lipid Panel    Component Value Date/Time   CHOL 184 01/31/2008 2114   TRIG 62 01/31/2008 2114   HDL 52 01/31/2008 2114   CHOLHDL 3.5 Ratio 01/31/2008 2114   VLDL 12 01/31/2008 2114   LDLCALC 120 (H) 01/31/2008 2114    Physical Exam:    VS:  BP 130/86   Pulse 80   Ht 6' (1.829 m)   Wt 230 lb 1.9 oz (104.4 kg)   SpO2 98%   BMI 31.21 kg/m     Wt Readings from Last 3 Encounters:  05/17/18 230 lb 1.9 oz (104.4 kg)  05/06/18 230 lb (104.3 kg)  03/20/18 231 lb (104.8 kg)     GEN:  Well nourished, well developed in no acute distress HEENT: Normal NECK: No JVD; No carotid bruits LYMPHATICS: No lymphadenopathy CARDIAC: RRR, no murmurs, no rubs, no gallops RESPIRATORY:  Clear to auscultation without rales, wheezing or rhonchi  ABDOMEN: Soft, non-tender, non-distended MUSCULOSKELETAL:  No edema; No deformity  SKIN:  Warm and dry NEUROLOGIC:  Alert and oriented x 3 PSYCHIATRIC:  Normal affect   ASSESSMENT:    1. Congestive heart failure, unspecified HF chronicity, unspecified heart failure type (HCC)   2. Dyspnea on exertion   3. Atypical chest pain   4. Essential hypertension    PLAN:    In order of problems listed above:  1. Congestive heart failure at the moment of my interview and evaluation he seems to be compensated I will schedule him to have an echocardiogram to assess left ventricular ejection fraction which will be absolutely most essential test that this gentleman required he is telling me that he does not have any insurance he does not want to spend  too much money of his care I told him that at least this test is absolutely essential.  I did review his proBNP which is only mildly elevated.  His signs and symptoms of congestive heart failure upon presentation to the emergency room could be related to extremely high blood pressure that was not well controlled. 2. Dyspnea on exertion probably multifactorial obviously congestive heart failure need to be assessed he is improving clinically with appropriate medication which I will continue if he truly does have cardiomyopathy he need to be on ARB or Entresto as well as beta-blocker.  Decision regarding the therapy will be made based on results of echocardiogram. 3. Atypical chest pain we start talking about need to have a stress test.  He said he does not want to do it right now because of financial issues this is something we can continue pushing forward in the future. 4. Essential hypertension blood pressure appears to be fairly controlled again this is something that we need to keep an eye on I advised him to get blood pressure monitor and check his blood pressure on the regular basis taken out of it and bring it to me.   Medication Adjustments/Labs and Tests Ordered: Current medicines are reviewed at length with the patient today.  Concerns  regarding medicines are outlined above.  Orders Placed This Encounter  Procedures  . ECHOCARDIOGRAM COMPLETE   No orders of the defined types were placed in this encounter.   Signed, Georgeanna Lea, MD, [facility]. 05/17/2018 12:40 PM    [facility]

## 2018-05-19 ENCOUNTER — Other Ambulatory Visit: Payer: Self-pay

## 2018-05-19 ENCOUNTER — Ambulatory Visit (HOSPITAL_BASED_OUTPATIENT_CLINIC_OR_DEPARTMENT_OTHER)
Admission: RE | Admit: 2018-05-19 | Discharge: 2018-05-19 | Disposition: A | Discharge status: Home / Self Care | Payer: Self-pay | Source: Ambulatory Visit | Attending: Cardiology | Admitting: Cardiology

## 2018-05-19 DIAGNOSIS — I509 Heart failure, unspecified: Secondary | ICD-10-CM | POA: Insufficient documentation

## 2018-05-19 NOTE — Progress Notes (Signed)
  Echocardiogram 2D Echocardiogram has been performed.  Cline Draheim T Kieran Nachtigal 05/19/2018, 4:04 PM

## 2018-05-26 ENCOUNTER — Telehealth: Payer: Self-pay | Admitting: Emergency Medicine

## 2018-05-26 DIAGNOSIS — I10 Essential (primary) hypertension: Secondary | ICD-10-CM

## 2018-05-26 MED ORDER — SACUBITRIL-VALSARTAN 24-26 MG PO TABS: 1.0000 | ORAL_TABLET | Freq: Two times a day (BID) | ORAL | 3 refills | Status: DC

## 2018-05-26 NOTE — Telephone Encounter (Signed)
Patient informed of results and advised to start entresto 24-26 mg twice daily, and have labs redrawn in 1 week. Patient verbally understands. Prescription sent to requested pharmacy. No further questions.

## 2018-06-07 ENCOUNTER — Telehealth: Payer: Self-pay | Admitting: Cardiology

## 2018-06-07 NOTE — Telephone Encounter (Signed)
Patient agreed to [facility] and Consent was sent thru [facility] Chart.  Registration was verified/[facility]

## 2018-06-09 ENCOUNTER — Telehealth (INDEPENDENT_AMBULATORY_CARE_PROVIDER_SITE_OTHER): Payer: Self-pay | Admitting: Cardiology

## 2018-06-09 ENCOUNTER — Encounter: Payer: Self-pay | Admitting: Cardiology

## 2018-06-09 ENCOUNTER — Telehealth: Payer: Self-pay

## 2018-06-09 ENCOUNTER — Other Ambulatory Visit: Payer: Self-pay

## 2018-06-09 DIAGNOSIS — I428 Other cardiomyopathies: Secondary | ICD-10-CM | POA: Insufficient documentation

## 2018-06-09 DIAGNOSIS — R0609 Other forms of dyspnea: Secondary | ICD-10-CM

## 2018-06-09 DIAGNOSIS — I420 Dilated cardiomyopathy: Secondary | ICD-10-CM

## 2018-06-09 DIAGNOSIS — I10 Essential (primary) hypertension: Secondary | ICD-10-CM

## 2018-06-09 DIAGNOSIS — I429 Cardiomyopathy, unspecified: Secondary | ICD-10-CM

## 2018-06-09 DIAGNOSIS — I110 Hypertensive heart disease with heart failure: Secondary | ICD-10-CM

## 2018-06-09 DIAGNOSIS — I5042 Chronic combined systolic (congestive) and diastolic (congestive) heart failure: Secondary | ICD-10-CM

## 2018-06-09 DIAGNOSIS — R0789 Other chest pain: Secondary | ICD-10-CM

## 2018-06-09 HISTORY — DX: Cardiomyopathy, unspecified: I42.9

## 2018-06-09 NOTE — Patient Instructions (Signed)
Medication Instructions:  STOP: Lisinopril   If you need a refill on your cardiac medications before your next appointment, please call your pharmacy.   Lab work: None.  If you have labs (blood work) drawn today and your tests are completely normal, you will receive your results only by: Marland Kitchen MyChart Message (if you have MyChart) OR . A paper copy in the mail If you have any lab test that is abnormal or we need to change your treatment, we will call you to review the results.  Testing/Procedures: None.   Follow-Up: At [facility], you and your health needs are our priority.  As part of our continuing mission to provide you with exceptional heart care, we have created designated Provider Care Teams.  These Care Teams include your primary Cardiologist (physician) and Advanced Practice Providers (APPs -  Physician Assistants and Nurse Practitioners) who all work together to provide you with the care you need, when you need it. You will need a follow up appointment in 4 days.  Please call our office 2 months in advance to schedule this appointment.  You may see No primary care provider on file. or another member of our [employer] Provider Team in [place]: Norman Herrlich, MD . Belva Crome, MD  Any Other Special Instructions Will Be Listed Below (If Applicable).

## 2018-06-09 NOTE — Addendum Note (Signed)
Addended by: Lita Mains on: 06/09/2018 10:31 AM   Modules accepted: Orders

## 2018-06-09 NOTE — Progress Notes (Signed)
Virtual Visit via Video Note    Evaluation Performed:  Follow-up visit  This visit type was conducted due to national recommendations for restrictions regarding the COVID-19 Pandemic (e.g. social distancing).  This format is felt to be most appropriate for this patient at this time.  All issues noted in this document were discussed and addressed.  No physical exam was performed (except for noted visual exam findings with Video Visits).  Please refer to the patient's chart (MyChart message for video visits and phone note for telephone visits) for the patient's consent to telehealth for [facility].  Date:  06/09/2018  ID: Jeffery Glenn, DOB 09/04/1970, MRN [id number]   Patient Location:  [address] [place] [address]   Provider location:   [address] [facility] [place] [facility]  PCP:  [facility]  Cardiologist:  Gypsy Balsam, MD     Chief Complaint: Doing better  History of Present Illness:    Jeffery Glenn is a 48 y.o. male  who presents via audio/video conferencing for a telehealth visit today.  He was diagnosed clinically with congestive heart failure appropriate medication has been initiated clinically he is doing much better.  Echocardiogram performed which showed ejection fraction 25 to 30% without segmental wall motion abnormalities.  We doing teleconference today.  With the video link he is doing better he said he shortness of breath improved significantly he can do what he wants to do with no difficulties.  He showed me his legs does not small and he does not have an enlarged JVD sitting upright.  Sadly he misunderstood our instructions and he was taking both lisinopril as well as Entresto.  Multiple times I stressed importance of stopping lisinopril and he understood that.  The goal will be obviously put him on appropriate medications which would include also beta-blocker.  I will see him very quickly in the next week to reemphasize appropriateness of  taking medication right away as well as hopefully augment his medications at that time and most likely will add some beta-blocker.  Likely clinically he is improving quite significantly and feeling better.  He was not able to check his blood pressure today I gave him instruction how to check his blood pressure he is doing a practice and let us know what his blood pressure is   The patient does not have symptoms concerning for COVID-19 infection (fever, chills, cough, or new SHORTNESS OF BREATH).    Prior CV studies:   The following studies were reviewed today:  Echocardiogram reviewed which showed ejection fraction 25 to 30%     Past Medical History:  Diagnosis Date  . Congestive heart failure (CHF) (HCC) 05/06/2018  . Hypertension     Past Surgical History:  Procedure Laterality Date  . HAND SURGERY    . NASAL SEPTUM SURGERY       Current Meds  Medication Sig  . amLODipine (NORVASC) 10 MG tablet Take 1 tablet (10 mg total) by mouth daily.  . furosemide (LASIX) 40 MG tablet Take 40 mg by mouth daily.  Marland Kitchen lisinopril (PRINIVIL,ZESTRIL) 20 MG tablet Take 20 mg by mouth daily.  Marland Kitchen loratadine (CLARITIN) 5 MG chewable tablet Chew 5 mg by mouth daily.  . sacubitril-valsartan (ENTRESTO) 24-26 MG Take 1 tablet by mouth 2 (two) times daily.      Family History: The patient's family history includes Heart attack in his father and mother; Hypertension in his father and mother.   ROS:   Please  see the history of present illness.     All other systems reviewed and are negative.   Labs/Other Tests and Data Reviewed:     Recent Labs: 05/06/2018: ALT 36; B Natriuretic Peptide 357.3; BUN 17; Creatinine, Ser 0.91; Hemoglobin 14.1; Platelets 166; Potassium 3.9; Sodium 139  Recent Lipid Panel    Component Value Date/Time   CHOL 184 01/31/2008 2114   TRIG 62 01/31/2008 2114   HDL 52 01/31/2008 2114   CHOLHDL 3.5 Ratio 01/31/2008 2114   VLDL 12 01/31/2008 2114   LDLCALC 120 (H)  01/31/2008 2114      Exam:    Vital Signs:  There were no vitals taken for this visit.    Wt Readings from Last 3 Encounters:  05/17/18 230 lb 1.9 oz (104.4 kg)  05/06/18 230 lb (104.3 kg)  03/20/18 231 lb (104.8 kg)     Well nourished, well developed male in no acute distress. No swelling of lower extremities no JVD.  He is alert awake and in x3 and quite comfortable.  Diagnosis for this visit:   1. Chronic combined systolic and diastolic congestive heart failure (HCC)   2. Essential hypertension   3. Dyspnea on exertion   4. Atypical chest pain   5. Dilated cardiomyopathy (HCC)      ASSESSMENT & PLAN:    1.  Chronic systolic/diastolic congestive heart failure.  He misunderstood our instruction was taking lisinopril and Entresto together I stressed multiple times need to stop lisinopril he understood.  Will see him at the beginning of next week and hopefully at that time I will reemphasize again need of taking medication and appropriate way and hopefully by that time we will add small dose of beta-blocker.  In the meantime he is going to learn how to check his blood pressure at home. 2.  Essential hypertension again he was not able to check his blood pressure today he does have new blood pressure monitor I was telling her how to use it hopefully he will be able to understand that.  If not we may be forced to bring him to the office 3.  Dyspnea on exertion significantly improved. 4.  Atypical chest pain denies having any lately. 5.  Dilated cardiomyopathy plan as outlined above  COVID-19 Education: The signs and symptoms of COVID-19 were discussed with the patient and how to seek care for testing (follow up with PCP or arrange E-visit).  The importance of social distancing was discussed today.  Patient Risk:   After full review of this patients clinical status, I feel that they are at least moderate risk at this time.  Time:   Today, I have spent 17 minutes with the  patient with telehealth technology discussing pt health issues. Visit was finished at 1018.    Medication Adjustments/Labs and Tests Ordered: Current medicines are reviewed at length with the patient today.  Concerns regarding medicines are outlined above.  No orders of the defined types were placed in this encounter.  Medication changes: No orders of the defined types were placed in this encounter.    Disposition: Tele-visits at the beginning of next week.  He may required physical visit  Signed, Georgeanna Lea, MD, [facility] 06/09/2018 10:17 AM    [facility]

## 2018-06-09 NOTE — Telephone Encounter (Signed)
Virtual Visit Pre-Appointment Phone Call  Steps For Call:  1. Confirm consent - "In the setting of the current Covid19 crisis, you are scheduled for a (phone or video) visit with your provider on (date) at (time).  Just as we do with many in-office visits, in order for you to participate in this visit, we must obtain consent.  If you'd like, I can send this to your mychart (if signed up) or email for you to review.  Otherwise, I can obtain your verbal consent now.  All virtual visits are billed to your insurance company just like a normal visit would be.  By agreeing to a virtual visit, we'd like you to understand that the technology does not allow for your provider to perform an examination, and thus may limit your provider's ability to fully assess your condition.  Finally, though the technology is pretty good, we cannot assure that it will always work on either your or our end, and in the setting of a video visit, we may have to convert it to a phone-only visit.  In either situation, we cannot ensure that we have a secure connection.  Are you willing to proceed?"  2. Give patient instructions for WebEx download to smartphone as below if video visit  3. Advise patient to be prepared with any vital sign or heart rhythm information, their current medicines, and a piece of paper and pen handy for any instructions they may receive the day of their visit  4. Inform patient they will receive a phone call 15 minutes prior to their appointment time (may be from unknown caller ID) so they should be prepared to answer  5. Confirm that appointment type is correct in Epic appointment notes (video vs telephone)    TELEPHONE CALL NOTE  Jeffery Glenn has been deemed a candidate for a follow-up tele-health visit to limit community exposure during the Covid-19 pandemic. I spoke with the patient via phone to ensure availability of phone/video source, confirm preferred email & phone number, and discuss  instructions and expectations.  I reminded Jeffery Glenn to be prepared with any vital sign and/or heart rhythm information that could potentially be obtained via home monitoring, at the time of his visit. I reminded Jeffery Glenn to expect a phone call at the time of his visit if his visit.  Did the patient verbally acknowledge consent to treatment? Yes  Arville Care, CMA 06/09/2018 9:57 AM   DOWNLOADING THE WEBEX SOFTWARE TO SMARTPHONE  - If Apple, go to [employer] and type in WebEx in the search bar. Download [employer], the blue/green circle. The app is free but as with any other app downloads, their phone may require them to verify saved payment information or Apple password. The patient does NOT have to create an account.  - If Android, ask patient to go to [employer] and type in WebEx in the search bar. Download [employer], the blue/green circle. The app is free but as with any other app downloads, their phone may require them to verify saved payment information or Android password. The patient does NOT have to create an account.   CONSENT FOR TELE-HEALTH VISIT - PLEASE REVIEW  I hereby voluntarily request, consent and authorize [facility] and its employed or contracted physicians, physician assistants, nurse practitioners or other licensed health care professionals (the Practitioner), to provide me with telemedicine health care services (the "Services") as deemed necessary by the treating Practitioner. I acknowledge and  consent to receive the Services by the Practitioner via telemedicine. I understand that the telemedicine visit will involve communicating with the Practitioner through live audiovisual communication technology and the disclosure of certain medical information by electronic transmission. I acknowledge that I have been given the opportunity to request an in-person assessment or other available alternative prior to the telemedicine visit and am  voluntarily participating in the telemedicine visit.  I understand that I have the right to withhold or withdraw my consent to the use of telemedicine in the course of my care at any time, without affecting my right to future care or treatment, and that the Practitioner or I may terminate the telemedicine visit at any time. I understand that I have the right to inspect all information obtained and/or recorded in the course of the telemedicine visit and may receive copies of available information for a reasonable fee.  I understand that some of the potential risks of receiving the Services via telemedicine include:  Marland Kitchen Delay or interruption in medical evaluation due to technological equipment failure or disruption; . Information transmitted may not be sufficient (e.g. poor resolution of images) to allow for appropriate medical decision making by the Practitioner; and/or  . In rare instances, security protocols could fail, causing a breach of personal health information.  Furthermore, I acknowledge that it is my responsibility to provide information about my medical history, conditions and care that is complete and accurate to the best of my ability. I acknowledge that Practitioner's advice, recommendations, and/or decision may be based on factors not within their control, such as incomplete or inaccurate data provided by me or distortions of diagnostic images or specimens that may result from electronic transmissions. I understand that the practice of medicine is not an exact science and that Practitioner makes no warranties or guarantees regarding treatment outcomes. I acknowledge that I will receive a copy of this consent concurrently upon execution via email to the email address I last provided but may also request a printed copy by calling the office of [place].    I understand that my insurance will be billed for this visit.   I have read or had this consent read to me. . I understand the  contents of this consent, which adequately explains the benefits and risks of the Services being provided via telemedicine.  . I have been provided ample opportunity to ask questions regarding this consent and the Services and have had my questions answered to my satisfaction. . I give my informed consent for the services to be provided through the use of telemedicine in my medical care  By participating in this telemedicine visit I agree to the above.

## 2018-06-13 ENCOUNTER — Other Ambulatory Visit: Payer: Self-pay

## 2018-06-13 ENCOUNTER — Telehealth (INDEPENDENT_AMBULATORY_CARE_PROVIDER_SITE_OTHER): Payer: Self-pay | Admitting: Cardiology

## 2018-06-13 ENCOUNTER — Telehealth: Payer: Self-pay | Admitting: Emergency Medicine

## 2018-06-13 ENCOUNTER — Encounter: Payer: Self-pay | Admitting: Cardiology

## 2018-06-13 VITALS — BP 149/97 | HR 89

## 2018-06-13 DIAGNOSIS — I5042 Chronic combined systolic (congestive) and diastolic (congestive) heart failure: Secondary | ICD-10-CM

## 2018-06-13 DIAGNOSIS — R0789 Other chest pain: Secondary | ICD-10-CM

## 2018-06-13 DIAGNOSIS — R0609 Other forms of dyspnea: Secondary | ICD-10-CM

## 2018-06-13 DIAGNOSIS — I420 Dilated cardiomyopathy: Secondary | ICD-10-CM

## 2018-06-13 DIAGNOSIS — I10 Essential (primary) hypertension: Secondary | ICD-10-CM

## 2018-06-13 MED ORDER — CARVEDILOL 3.125 MG PO TABS: 3.1250 mg | ORAL_TABLET | Freq: Two times a day (BID) | ORAL | 1 refills | Status: DC

## 2018-06-13 NOTE — Progress Notes (Signed)
Virtual Visit via Video Note   This visit type was conducted due to national recommendations for restrictions regarding the COVID-19 Pandemic (e.g. social distancing) in an effort to limit this patient's exposure and mitigate transmission in our community.  Due to his co-morbid illnesses, this patient is at least at moderate risk for complications without adequate follow up.  This format is felt to be most appropriate for this patient at this time.  All issues noted in this document were discussed and addressed.  A limited physical exam was performed with this format.  Please refer to the patient's chart for his consent to telehealth for  [facility].  Evaluation Performed:  Follow-up visit  This visit type was conducted due to national recommendations for restrictions regarding the COVID-19 Pandemic (e.g. social distancing).  This format is felt to be most appropriate for this patient at this time.  All issues noted in this document were discussed and addressed.  No physical exam was performed (except for noted visual exam findings with Video Visits).  Please refer to the patient's chart (MyChart message for video visits and phone note for telephone visits) for the patient's consent to telehealth for [facility]  Date:  06/13/2018  ID: [name], DOB 20-Sep-1970, MRN [id number]   Patient Location:  [address] [place] [address]   Provider location:   [facility] [place] [facility]  PCP:  [facility]  Cardiologist:  Gypsy Balsam, MD     Chief Complaint: I am doing well  History of Present Illness:    Tayvon Yousuf is a 48 y.o. male  who presents via audio/video conferencing for a telehealth visit today.  He does have history of cardiomyopathy with severely diminished left ventricular ejection fraction.  Also history of hypertension.  Gradually we try to put him on all appropriate medications.  Last time when I had video conference with him he was  taking medication the wrong way he was taking lisinopril and Entresto.  I asked him to stop lisinopril and the purpose of this visit is to make sure that he comply with that.  And he did he stop his lisinopril he takes Entresto and overall feels good and the reason for today's visit was to look for opportunity to add some beneficial medication for him.  I decided to put him on carvedilol 3.125 twice daily and prescription will be sent for pharmacy.  I warned him about potential side effects of this medication asked him to let me know if any of this happened.  I stressed importance of taking all medications the way he takes.  He does not take amlodipine and I want him to stay away from it.  Hopefully gradually will be able to put him on all appropriate medication which will lead to appropriate management of blood pressure and improvement in his left ventricular ejection fraction.   The patient does not have symptoms concerning for COVID-19 infection (fever, chills, cough, or new SHORTNESS OF BREATH).    Prior CV studies:   The following studies were reviewed today:       Past Medical History:  Diagnosis Date  . Congestive heart failure (CHF) (HCC) 05/06/2018  . Hypertension     Past Surgical History:  Procedure Laterality Date  . HAND SURGERY    . NASAL SEPTUM SURGERY       Current Meds  Medication Sig  . amLODipine (NORVASC) 10 MG tablet Take 1 tablet (10 mg total) by  mouth daily.  . furosemide (LASIX) 40 MG tablet Take 40 mg by mouth daily.  . Loratadine 10 MG CAPS Take 10 mg by mouth daily.   . meloxicam (MOBIC) 15 MG tablet Take 1 tablet (15 mg total) by mouth daily.  . sacubitril-valsartan (ENTRESTO) 24-26 MG Take 1 tablet by mouth 2 (two) times daily.      Family History: The patient's family history includes Heart attack in his father and mother; Hypertension in his father and mother.   ROS:   Please see the history of present illness.     All other systems reviewed  and are negative.   Labs/Other Tests and Data Reviewed:     Recent Labs: 05/06/2018: ALT 36; B Natriuretic Peptide 357.3; BUN 17; Creatinine, Ser 0.91; Hemoglobin 14.1; Platelets 166; Potassium 3.9; Sodium 139  Recent Lipid Panel    Component Value Date/Time   CHOL 184 01/31/2008 2114   TRIG 62 01/31/2008 2114   HDL 52 01/31/2008 2114   CHOLHDL 3.5 Ratio 01/31/2008 2114   VLDL 12 01/31/2008 2114   LDLCALC 120 (H) 01/31/2008 2114      Exam:    Vital Signs:  BP (!) 149/97   Pulse 89     Wt Readings from Last 3 Encounters:  05/17/18 230 lb 1.9 oz (104.4 kg)  05/06/18 230 lb (104.3 kg)  03/20/18 231 lb (104.8 kg)     Well nourished, well developed male in no acute distress. Alert awake oriented x3.  He is sitting on the porch of his house.  He showed me his neck he does not have JVD he also show me his legs there is no swelling.  Diagnosis for this visit:   1. Dilated cardiomyopathy (HCC)   2. Essential hypertension   3. Chronic combined systolic and diastolic congestive heart failure (HCC)   4. Atypical chest pain   5. Dyspnea on exertion      ASSESSMENT & PLAN:    1.  Dilated cardiomyopathy Sherryll Burger is already on board.  Will add beta-blocker.  We will see him back in about 2 weeks and video conferencing and at that time we will probably double the dose of Entresto. 2.  Essential hypertension blood pressure appears to be still elevated but gradually putting him on appropriate medications and things are getting better. 3.  Atypical chest pain denies having any.  We will continue present management.  4.  Dyspnea on exertion.  Improving.  COVID-19 Education: The signs and symptoms of COVID-19 were discussed with the patient and how to seek care for testing (follow up with PCP or arrange E-visit).  The importance of social distancing was discussed today.  Patient Risk:   After full review of this patients clinical status, I feel that they are at least moderate risk  at this time.  Time:   Today, I have spent 15 minutes with the patient with telehealth technology discussing pt health issues. Visit was finished at 12:34 AM.  I spent 5 minutes reviewing his chart before visit    Medication Adjustments/Labs and Tests Ordered: Current medicines are reviewed at length with the patient today.  Concerns regarding medicines are outlined above.  No orders of the defined types were placed in this encounter.  Medication changes: No orders of the defined types were placed in this encounter.    Disposition:  F/up in 3 weeks  Add Coreg 3.125 mg po bid  Signed, Georgeanna Lea, MD, [facility] 06/13/2018 12:31 PM    [facility]

## 2018-06-13 NOTE — Telephone Encounter (Signed)
Left message for patient to return call to go over over d/c instructions.

## 2018-06-14 MED ORDER — CARVEDILOL 3.125 MG PO TABS: 3.1250 mg | ORAL_TABLET | Freq: Two times a day (BID) | ORAL | 1 refills | Status: DC

## 2018-06-14 NOTE — Telephone Encounter (Signed)
Patient informed of d/c instructions, scheduled for follow up appt. D/c paperwork will be mailed to him.

## 2018-06-14 NOTE — Addendum Note (Signed)
Addended by: Lita Mains on: 06/14/2018 11:36 AM   Modules accepted: Orders

## 2018-06-14 NOTE — Patient Instructions (Signed)
Medication Instructions:  Your physician has recommended you make the following change in your medication:   Start Coreg 3.125 mg twice daily Stop Amlodipine  If you need a refill on your cardiac medications before your next appointment, please call your pharmacy.   Lab work: None If you have labs (blood work) drawn today and your tests are completely normal, you will receive your results only by: Marland Kitchen MyChart Message (if you have MyChart) OR . A paper copy in the mail If you have any lab test that is abnormal or we need to change your treatment, we will call you to review the results.  Testing/Procedures: None  Follow-Up: At [facility], you and your health needs are our priority.  As part of our continuing mission to provide you with exceptional heart care, we have created designated Provider Care Teams.  These Care Teams include your primary Cardiologist (physician) and Advanced Practice Providers (APPs -  Physician Assistants and Nurse Practitioners) who all work together to provide you with the care you need, when you need it. You will need a follow up appointment in 3 weeks.  Please call our office 2 months in advance to schedule this appointment.  You may see No primary care provider on file. or another member of our [employer] Provider Team in [place]: Norman Herrlich, MD . Belva Crome, MD  Any Other Special Instructions Will Be Listed Below (If Applicable).  Carvedilol tablets What is this medicine? CARVEDILOL (KAR ve dil ol) is a beta-blocker. Beta-blockers reduce the workload on the heart and help it to beat more regularly. This medicine is used to treat high blood pressure and heart failure. This medicine may be used for other purposes; ask your health care provider or pharmacist if you have questions. COMMON BRAND NAME(S): Coreg What should I tell my health care provider before I take this medicine? They need to know if you have any of these conditions: -circulation  problems -diabetes -history of heart attack or heart disease -liver disease -lung or breathing disease, like asthma or emphysema -pheochromocytoma -slow or irregular heartbeat -thyroid disease -an unusual or allergic reaction to carvedilol, other beta-blockers, medicines, foods, dyes, or preservatives -pregnant or trying to get pregnant -breast-feeding How should I use this medicine? Take this medicine by mouth with a glass of water. Follow the directions on the prescription label. It is best to take the tablets with food. Take your doses at regular intervals. Do not take your medicine more often than directed. Do not stop taking except on the advice of your doctor or health care professional. Talk to your pediatrician regarding the use of this medicine in children. Special care may be needed. Overdosage: If you think you have taken too much of this medicine contact a poison control center or emergency room at once. NOTE: This medicine is only for you. Do not share this medicine with others. What if I miss a dose? If you miss a dose, take it as soon as you can. If it is almost time for your next dose, take only that dose. Do not take double or extra doses. What may interact with this medicine? This medicine may interact with the following medications: -certain medicines for blood pressure, heart disease, irregular heart beat -certain medicines for depression, like fluoxetine or paroxetine -certain medicines for diabetes, like glipizide or glyburide -cimetidine -clonidine -cyclosporine -digoxin -MAOIs like Carbex, Eldepryl, Marplan, Nardil, and Parnate -reserpine -rifampin This list may not describe all possible interactions. Give your health care  provider a list of all the medicines, herbs, non-prescription drugs, or dietary supplements you use. Also tell them if you smoke, drink alcohol, or use illegal drugs. Some items may interact with your medicine. What should I watch for while  using this medicine? Check your heart rate and blood pressure regularly while you are taking this medicine. Ask your doctor or health care professional what your heart rate and blood pressure should be, and when you should contact him or her. Do not stop taking this medicine suddenly. This could lead to serious heart-related effects. Contact your doctor or health care professional if you have difficulty breathing while taking this drug. Check your weight daily. Ask your doctor or health care professional when you should notify him/her of any weight gain. You may get drowsy or dizzy. Do not drive, use machinery, or do anything that requires mental alertness until you know how this medicine affects you. To reduce the risk of dizzy or fainting spells, do not sit or stand up quickly. Alcohol can make you more drowsy, and increase flushing and rapid heartbeats. Avoid alcoholic drinks. If you have diabetes, check your blood sugar as directed. Tell your doctor if you have changes in your blood sugar while you are taking this medicine. If you are going to have surgery, tell your doctor or health care professional that you are taking this medicine. What side effects may I notice from receiving this medicine? Side effects that you should report to your doctor or health care professional as soon as possible: -allergic reactions like skin rash, itching or hives, swelling of the face, lips, or tongue -breathing problems -dark urine -irregular heartbeat -swollen legs or ankles -vomiting -yellowing of the eyes or skin Side effects that usually do not require medical attention (report to your doctor or health care professional if they continue or are bothersome): -change in sex drive or performance -diarrhea -dry eyes (especially if wearing contact lenses) -dry, itching skin -headache -nausea -unusually tired This list may not describe all possible side effects. Call your doctor for medical advice about side  effects. You may report side effects to FDA at 1-800-FDA-1088. Where should I keep my medicine? Keep out of the reach of children. Store at room temperature below 30 degrees C (86 degrees F). Protect from moisture. Keep container tightly closed. Throw away any unused medicine after the expiration date. NOTE: This sheet is a summary. It may not cover all possible information. If you have questions about this medicine, talk to your doctor, pharmacist, or health care provider.  2019 Elsevier/[employer] Standard (2012-10-30 14:12:02)

## 2018-06-27 ENCOUNTER — Telehealth: Payer: Self-pay | Admitting: Emergency Medicine

## 2018-06-27 NOTE — Telephone Encounter (Signed)
Patient called in concerned about entresto since he will run out Wednesday and is not able to afford a refill. I have gave him the number to [facility] patient assistance and he will call us back when he finds out about patient assistance.

## 2018-06-27 NOTE — Telephone Encounter (Signed)
Patient called back stating that patient assistance will take at least two weeks. I informed him we didn't have samples that we are ordering them now since the reps can bring to the office. Patient verbally understands and will pay for his medication this time and continue to work on patient assistance and insurance. I informed him to cal [place] with any concerns going forward. Patient verbally understands.

## 2018-06-29 ENCOUNTER — Other Ambulatory Visit: Payer: Self-pay | Admitting: *Deleted

## 2018-06-29 MED ORDER — VALSARTAN 80 MG PO TABS: 80.0000 mg | ORAL_TABLET | Freq: Every day | ORAL | 1 refills | Status: DC

## 2018-06-29 NOTE — Telephone Encounter (Signed)
Left message to stop entresto and start valsartan 80mg  daily in its place.

## 2018-07-06 ENCOUNTER — Other Ambulatory Visit: Payer: Self-pay

## 2018-07-06 ENCOUNTER — Encounter: Payer: Self-pay | Admitting: Cardiology

## 2018-07-06 ENCOUNTER — Telehealth (INDEPENDENT_AMBULATORY_CARE_PROVIDER_SITE_OTHER): Payer: Self-pay | Admitting: Cardiology

## 2018-07-06 DIAGNOSIS — R0789 Other chest pain: Secondary | ICD-10-CM

## 2018-07-06 DIAGNOSIS — R0609 Other forms of dyspnea: Secondary | ICD-10-CM

## 2018-07-06 DIAGNOSIS — I5042 Chronic combined systolic (congestive) and diastolic (congestive) heart failure: Secondary | ICD-10-CM

## 2018-07-06 DIAGNOSIS — I420 Dilated cardiomyopathy: Secondary | ICD-10-CM

## 2018-07-06 DIAGNOSIS — I10 Essential (primary) hypertension: Secondary | ICD-10-CM

## 2018-07-06 MED ORDER — CARVEDILOL 6.25 MG PO TABS: 6.2500 mg | ORAL_TABLET | Freq: Two times a day (BID) | ORAL | 1 refills | Status: DC

## 2018-07-06 NOTE — Progress Notes (Signed)
Virtual Visit via Video Note   This visit type was conducted due to national recommendations for restrictions regarding the COVID-19 Pandemic (e.g. social distancing) in an effort to limit this patient's exposure and mitigate transmission in our community.  Due to his co-morbid illnesses, this patient is at least at moderate risk for complications without adequate follow up.  This format is felt to be most appropriate for this patient at this time.  All issues noted in this document were discussed and addressed.  A limited physical exam was performed with this format.  Please refer to the patient's chart for his consent to telehealth for [facility].  Evaluation Performed:  Follow-up visit  This visit type was conducted due to national recommendations for restrictions regarding the COVID-19 Pandemic (e.g. social distancing).  This format is felt to be most appropriate for this patient at this time.  All issues noted in this document were discussed and addressed.  No physical exam was performed (except for noted visual exam findings with Video Visits).  Please refer to the patient's chart (MyChart message for video visits and phone note for telephone visits) for the patient's consent to telehealth for [facility].  Date:  07/06/2018  ID: Jeffery Glenn, DOB 01-20-1971, MRN [id number]   Patient Location: [address] [place] [address]   Provider location:   [facility] [place] [facility]  PCP:  Inc, [facility]  Cardiologist:  Gypsy Balsam, MD     Chief Complaint: Doing well  History of Present Illness:    Cyrill Lucchese is a 48 y.o. male  who presents via audio/video conferencing for a telehealth visit today.  With cardiomyopathy.  Gradually in 2015 on the right medications.  He did have problem with Entresto simply he could not afford it.  Therefore he is on different ARB.  Last time I put him on 3.125 mg Coreg twice daily he seems to be tolerating this very  well with no side effect of it therefore I will increase dose of carvedilol to 6.25 twice daily I want him about potential side effects and will ask him to let me know if he develops any of this.  Otherwise we will simply continue present medications with increased dose of carvedilol.  He denies having any chest pain tightness squeezing pressure been chest no shortness of breath no swelling of lower extremities   The patient does not have symptoms concerning for COVID-19 infection (fever, chills, cough, or new SHORTNESS OF BREATH).    Prior CV studies:   The following studies were reviewed today:       Past Medical History:  Diagnosis Date  . Congestive heart failure (CHF) (HCC) 05/06/2018  . Hypertension     Past Surgical History:  Procedure Laterality Date  . HAND SURGERY    . NASAL SEPTUM SURGERY       Current Meds  Medication Sig  . carvedilol (COREG) 3.125 MG tablet Take 1 tablet (3.125 mg total) by mouth 2 (two) times daily.  . furosemide (LASIX) 40 MG tablet Take 40 mg by mouth daily.  . Loratadine 10 MG CAPS Take 10 mg by mouth daily.   . meloxicam (MOBIC) 15 MG tablet Take 1 tablet (15 mg total) by mouth daily.  . valsartan (DIOVAN) 80 MG tablet Take 1 tablet (80 mg total) by mouth daily.      Family History: The patient's family history includes Heart attack in his father and mother; Hypertension in his  father and mother.   ROS:   Please see the history of present illness.     All other systems reviewed and are negative.   Labs/Other Tests and Data Reviewed:     Recent Labs: 05/06/2018: ALT 36; B Natriuretic Peptide 357.3; BUN 17; Creatinine, Ser 0.91; Hemoglobin 14.1; Platelets 166; Potassium 3.9; Sodium 139  Recent Lipid Panel    Component Value Date/Time   CHOL 184 01/31/2008 2114   TRIG 62 01/31/2008 2114   HDL 52 01/31/2008 2114   CHOLHDL 3.5 Ratio 01/31/2008 2114   VLDL 12 01/31/2008 2114   LDLCALC 120 (H) 01/31/2008 2114      Exam:     Vital Signs:  There were no vitals taken for this visit.    Wt Readings from Last 3 Encounters:  05/17/18 230 lb 1.9 oz (104.4 kg)  05/06/18 230 lb (104.3 kg)  03/20/18 231 lb (104.8 kg)     Well nourished, well developed in no acute distress. Alert awake oriented x3.  Talking to me from his car.  He seems to be comfortable without any distress  Diagnosis for this visit:   1. Chronic combined systolic and diastolic congestive heart failure (HCC)   2. Essential hypertension   3. Dilated cardiomyopathy (HCC)   4. Dyspnea on exertion   5. Atypical chest pain      ASSESSMENT & PLAN:    1.  Systolic and diastolic congestive heart failure gradually I am trying to put him on right medications.  I will increase dose of Coreg today to 6.25 twice daily. 2.  Blood pressure still elevated hopefully will be better with higher dose of Coreg. 3.  Dilated cardiomyopathy plan as outlined above. 4.  Dyspnea on exertion doing well from that point review. 5.  Atypical chest pain denies having any  COVID-19 Education: The signs and symptoms of COVID-19 were discussed with the patient and how to seek care for testing (follow up with PCP or arrange E-visit).  The importance of social distancing was discussed today.  Patient Risk:   After full review of this patients clinical status, I feel that they are at least moderate risk at this time.  Time:   Today, I have spent 14 minutes with the patient with telehealth technology discussing pt health issues.  I spent 5 minutes reviewing her chart before the visit.  Visit was finished at 4:02 PM.    Medication Adjustments/Labs and Tests Ordered: Current medicines are reviewed at length with the patient today.  Concerns regarding medicines are outlined above.  No orders of the defined types were placed in this encounter.  Medication changes: No orders of the defined types were placed in this encounter.    Disposition: Follow-up in 1 month  Signed,  Georgeanna Lea, MD, [facility] 07/06/2018 4:02 PM     [facility]

## 2018-07-06 NOTE — Patient Instructions (Signed)
Medication Instructions:  Your physician has recommended you make the following change in your medication:   Increase: Coreg to 6.25 mg twice daily   If you need a refill on your cardiac medications before your next appointment, please call your pharmacy.   Lab work: None.  If you have labs (blood work) drawn today and your tests are completely normal, you will receive your results only by: Marland Kitchen MyChart Message (if you have MyChart) OR . A paper copy in the mail If you have any lab test that is abnormal or we need to change your treatment, we will call you to review the results.  Testing/Procedures: None.   Follow-Up: At [facility], you and your health needs are our priority.  As part of our continuing mission to provide you with exceptional heart care, we have created designated Provider Care Teams.  These Care Teams include your primary Cardiologist (physician) and Advanced Practice Providers (APPs -  Physician Assistants and Nurse Practitioners) who all work together to provide you with the care you need, when you need it. You will need a follow up appointment in 1 months.  Please call our office 2 months in advance to schedule this appointment.  You may see No primary care provider on file. or another member of our [employer] Provider Team in [place]: Norman Herrlich, MD . Belva Crome, MD  Any Other Special Instructions Will Be Listed Below (If Applicable).

## 2018-08-05 ENCOUNTER — Telehealth: Payer: Medicaid Other | Admitting: Cardiology

## 2018-08-05 ENCOUNTER — Other Ambulatory Visit: Payer: Self-pay

## 2018-08-18 ENCOUNTER — Encounter: Payer: Self-pay | Admitting: Cardiology

## 2018-08-18 ENCOUNTER — Telehealth (INDEPENDENT_AMBULATORY_CARE_PROVIDER_SITE_OTHER): Payer: Self-pay | Admitting: Cardiology

## 2018-08-18 ENCOUNTER — Other Ambulatory Visit: Payer: Self-pay

## 2018-08-18 VITALS — BP 164/102

## 2018-08-18 DIAGNOSIS — R0789 Other chest pain: Secondary | ICD-10-CM

## 2018-08-18 DIAGNOSIS — I10 Essential (primary) hypertension: Secondary | ICD-10-CM

## 2018-08-18 DIAGNOSIS — R0609 Other forms of dyspnea: Secondary | ICD-10-CM

## 2018-08-18 DIAGNOSIS — I5042 Chronic combined systolic (congestive) and diastolic (congestive) heart failure: Secondary | ICD-10-CM

## 2018-08-18 DIAGNOSIS — I420 Dilated cardiomyopathy: Secondary | ICD-10-CM

## 2018-08-18 MED ORDER — ENTRESTO 24-26 MG PO TABS: 1.0000 | ORAL_TABLET | Freq: Two times a day (BID) | ORAL | 1 refills | Status: DC

## 2018-08-18 NOTE — Progress Notes (Signed)
Virtual Visit via Video Note   This visit type was conducted due to national recommendations for restrictions regarding the COVID-19 Pandemic (e.g. social distancing) in an effort to limit this patient's exposure and mitigate transmission in our community.  Due to his co-morbid illnesses, this patient is at least at moderate risk for complications without adequate follow up.  This format is felt to be most appropriate for this patient at this time.  All issues noted in this document were discussed and addressed.  A limited physical exam was performed with this format.  Please refer to the patient's chart for his consent to telehealth for [facility].  Evaluation Performed:  Follow-up visit  This visit type was conducted due to national recommendations for restrictions regarding the COVID-19 Pandemic (e.g. social distancing).  This format is felt to be most appropriate for this patient at this time.  All issues noted in this document were discussed and addressed.  No physical exam was performed (except for noted visual exam findings with Video Visits).  Please refer to the patient's chart (MyChart message for video visits and phone note for telephone visits) for the patient's consent to telehealth for [facility].  Date:  08/18/2018  ID: Jeffery Glenn, DOB 13-Dec-1970, MRN [id number]   Patient Location: [address] [place] [address]   Provider location:   [place] Office  PCP:  Inc, [facility]  Cardiologist:  Jenne Campus, MD     Chief Complaint: Doing better  History of Present Illness:    Jeffery Glenn is a 48 y.o. male  who presents via audio/video conferencing for a telehealth visit today.  With cardiomyopathy with severely diminished left ventricular ejection fraction.  I am trying to put him on the right medication however have difficulties because of lack of resources finally his Medicaid has been approved and he is able not to afford  medications.  Therefore I will stop his Micardis and I will put him on Entresto 24/26 twice daily.  We will continue beta-blocker at the present level I will ask him to come to the office in about a week to have Chem-7 done and EKG based on that we will know if we can increase his Entresto.  Overall he is doing well no chest pain tightness squeezing pressure burning chest.  He is able to do what he wants.   The patient does not have symptoms concerning for COVID-19 infection (fever, chills, cough, or new SHORTNESS OF BREATH).    Prior CV studies:   The following studies were reviewed today:       Past Medical History:  Diagnosis Date   Congestive heart failure (CHF) ([place]) 05/06/2018   Hypertension     Past Surgical History:  Procedure Laterality Date   HAND SURGERY     NASAL SEPTUM SURGERY       Current Meds  Medication Sig   carvedilol (COREG) 6.25 MG tablet Take 1 tablet (6.25 mg total) by mouth 2 (two) times daily.   furosemide (LASIX) 40 MG tablet Take 40 mg by mouth daily.   Loratadine 10 MG CAPS Take 10 mg by mouth daily.    valsartan (DIOVAN) 80 MG tablet Take 1 tablet (80 mg total) by mouth daily.      Family History: The patient's family history includes Heart attack in his father and mother; Hypertension in his father and mother.   ROS:   Please see the history of present illness.  All other systems reviewed and are negative.   Labs/Other Tests and Data Reviewed:     Recent Labs: 05/06/2018: ALT 36; B Natriuretic Peptide 357.3; BUN 17; Creatinine, Ser 0.91; Hemoglobin 14.1; Platelets 166; Potassium 3.9; Sodium 139  Recent Lipid Panel    Component Value Date/Time   CHOL 184 01/31/2008 2114   TRIG 62 01/31/2008 2114   HDL 52 01/31/2008 2114   CHOLHDL 3.5 Ratio 01/31/2008 2114   VLDL 12 01/31/2008 2114   LDLCALC 120 (H) 01/31/2008 2114      Exam:    Vital Signs:  BP (!) 164/102     Wt Readings from Last 3 Encounters:  05/17/18 230  lb 1.9 oz (104.4 kg)  05/06/18 230 lb (104.3 kg)  03/20/18 231 lb (104.8 kg)     Well nourished, well developed in no acute distress. Alert awake and at the time 3 happy to be able to talk to me over the video link.  Not in any distress  Diagnosis for this visit:   1. Chronic combined systolic and diastolic congestive heart failure (HCC)   2. Atypical chest pain   3. Dyspnea on exertion   4. Dilated cardiomyopathy (HCC)   5. Essential hypertension      ASSESSMENT & PLAN:    1.  Chronic combined systolic and diastolic congestive heart failure plan as outlined above overall hemodynamically stable will augment all medications 2.  Atypical chest pain denies having any 3.  Dyspnea on exertion much improved 4.  Dilated cardiomyopathy we tried to put him on right medications 5.  Essential hypertension hopefully will do better with augmenting of his medication.  COVID-19 Education: The signs and symptoms of COVID-19 were discussed with the patient and how to seek care for testing (follow up with PCP or arrange E-visit).  The importance of social distancing was discussed today.  Patient Risk:   After full review of this patients clinical status, I feel that they are at least moderate risk at this time.  Time:   Today, I have spent 15 minutes with the patient with telehealth technology discussing pt health issues.  I spent 5 minutes reviewing her chart before the visit.  Visit was finished at 3:47 PM.    Medication Adjustments/Labs and Tests Ordered: Current medicines are reviewed at length with the patient today.  Concerns regarding medicines are outlined above.  No orders of the defined types were placed in this encounter.  Medication changes: No orders of the defined types were placed in this encounter.    Disposition: Follow-up in 1 month  Signed, Georgeanna Lea, MD, [facility] 08/18/2018 3:47 PM    [facility]

## 2018-08-18 NOTE — Patient Instructions (Signed)
Medication Instructions:  Your physician has recommended you make the following change in your medication:   STOP: Valsartan  START: Entresto 24/26 mg twice daily   If you need a refill on your cardiac medications before your next appointment, please call your pharmacy.   Lab work: Your physician recommends that you return for lab work in 1 week: bmp   If you have labs (blood work) drawn today and your tests are completely normal, you will receive your results only by: Marland Kitchen MyChart Message (if you have MyChart) OR . A paper copy in the mail If you have any lab test that is abnormal or we need to change your treatment, we will call you to review the results.  Testing/Procedures: None.   Follow-Up: At [facility], you and your health needs are our priority.  As part of our continuing mission to provide you with exceptional heart care, we have created designated Provider Care Teams.  These Care Teams include your primary Cardiologist (physician) and Advanced Practice Providers (APPs -  Physician Assistants and Nurse Practitioners) who all work together to provide you with the care you need, when you need it. You will need a follow up appointment in 1 months.  Please call our office 2 months in advance to schedule this appointment.  You may see No primary care provider on file. or another member of our [employer] Provider Team in [place]: Shirlee More, MD . Jyl Heinz, MD You will need a follow up appointment in 1 months.  Please call our office 2 months in advance to schedule this appointment.  You may see No primary care provider on file. or another member of our [employer] Provider Team in [place]: Shirlee More, MD . Jyl Heinz, MD  Any Other Special Instructions Will Be Listed Below (If Applicable).  You will have a ekg and labs next week in [place] office.

## 2018-08-25 NOTE — Progress Notes (Deleted)
Cardiology Office Note:    Date:  08/25/2018   ID:  Jeffery Glenn, DOB [birth year], MRN [id number]  PCP:  [facility]  Cardiologist:  Shirlee More, MD    Referring MD: [facility]    ASSESSMENT:    No diagnosis found. PLAN:    In order of problems listed above:  1. ***   Next appointment: ***   Medication Adjustments/Labs and Tests Ordered: Current medicines are reviewed at length with the patient today.  Concerns regarding medicines are outlined above.  No orders of the defined types were placed in this encounter.  No orders of the defined types were placed in this encounter.   No chief complaint on file.   History of Present Illness:    Jeffery Glenn is a 48 y.o. male with a hx of cardiomyopathy recently initiated on Entresto 08/18/2018 and referred today for lab work to look at renal function proBNP and EKG and to optimize guideline directed treatment.  He was last seen 08/18/2018. Compliance with diet, lifestyle and medications: *** Past Medical History:  Diagnosis Date  . Congestive heart failure (CHF) ([place]) 05/06/2018  . Hypertension     Past Surgical History:  Procedure Laterality Date  . HAND SURGERY    . NASAL SEPTUM SURGERY      Current Medications: No outpatient medications have been marked as taking for the 08/26/18 encounter (Appointment) with Richardo Priest, MD.     Allergies:   Shellfish allergy and Peanut-containing drug products   Social History   Socioeconomic History  . Marital status: Married    Spouse name: Not on file  . Number of children: Not on file  . Years of education: Not on file  . Highest education level: Not on file  Occupational History  . Not on file  Social Needs  . Financial resource strain: Not on file  . Food insecurity    Worry: Not on file    Inability: Not on file  . Transportation needs    Medical: Not on file    Non-medical: Not on file  Tobacco Use  . Smoking  status: Never Smoker  . Smokeless tobacco: Never Used  Substance and Sexual Activity  . Alcohol use: Yes  . Drug use: No  . Sexual activity: Not on file  Lifestyle  . Physical activity    Days per week: Not on file    Minutes per session: Not on file  . Stress: Not on file  Relationships  . Social Herbalist on phone: Not on file    Gets together: Not on file    Attends religious service: Not on file    Active member of club or organization: Not on file    Attends meetings of clubs or organizations: Not on file    Relationship status: Not on file  Other Topics Concern  . Not on file  Social History Narrative  . Not on file     Family History: The patient's ***family history includes Heart attack in his father and mother; Hypertension in his father and mother. ROS:   Please see the history of present illness.    All other systems reviewed and are negative.  EKGs/Labs/Other Studies Reviewed:    The following studies were reviewed today: Echo 05/19/18:      1. The left ventricle has severely reduced systolic function, with an ejection fraction of 25-30%. The cavity size was normal. There is moderately  increased left ventricular wall thickness. Left ventricular diastolic Doppler parameters are consistent  with pseudonormalization. Left ventricular diffuse hypokinesis.  2. The right ventricle has normal systolic function. The cavity was normal. There is no increase in right ventricular wall thickness.  3. Left atrial size was moderately dilated.  4. Mitral valve regurgitation is mild to moderate by color flow Doppler.  5. The aortic valve is tricuspid Aortic valve regurgitation was not assessed by color flow Doppler.  EKG:  EKG ordered today and personally reviewed.  The ekg ordered today demonstrates ***  Recent Labs: 05/06/2018: ALT 36; B Natriuretic Peptide 357.3; BUN 17; Creatinine, Ser 0.91; Hemoglobin 14.1; Platelets 166; Potassium 3.9; Sodium 139  Recent Lipid  Panel    Component Value Date/Time   CHOL 184 01/31/2008 2114   TRIG 62 01/31/2008 2114   HDL 52 01/31/2008 2114   CHOLHDL 3.5 Ratio 01/31/2008 2114   VLDL 12 01/31/2008 2114   LDLCALC 120 (H) 01/31/2008 2114    Physical Exam:    VS:  There were no vitals taken for this visit.    Wt Readings from Last 3 Encounters:  05/17/18 230 lb 1.9 oz (104.4 kg)  05/06/18 230 lb (104.3 kg)  03/20/18 231 lb (104.8 kg)     GEN: *** Well nourished, well developed in no acute distress HEENT: Normal NECK: No JVD; No carotid bruits LYMPHATICS: No lymphadenopathy CARDIAC: ***RRR, no murmurs, rubs, gallops RESPIRATORY:  Clear to auscultation without rales, wheezing or rhonchi  ABDOMEN: Soft, non-tender, non-distended MUSCULOSKELETAL:  No edema; No deformity  SKIN: Warm and dry NEUROLOGIC:  Alert and oriented x 3 PSYCHIATRIC:  Normal affect    Signed, Norman Herrlich, MD  08/25/2018 2:22 PM    [facility]

## 2018-08-26 ENCOUNTER — Ambulatory Visit: Payer: Medicaid Other | Admitting: Cardiology

## 2018-08-26 ENCOUNTER — Other Ambulatory Visit: Payer: Self-pay

## 2018-08-27 LAB — BASIC METABOLIC PANEL
BUN/Creatinine Ratio: 11 (ref 9–20)
BUN: 12 mg/dL (ref 6–24)
CO2: 27 mmol/L (ref 20–29)
Calcium: 10 mg/dL (ref 8.7–10.2)
Chloride: 103 mmol/L (ref 96–106)
Creatinine, Ser: 1.07 mg/dL (ref 0.76–1.27)
GFR calc Af Amer: 96 mL/min/{1.73_m2} (ref >59)
GFR calc non Af Amer: 83 mL/min/{1.73_m2} (ref >59)
Glucose: 66 mg/dL (ref 65–99)
Potassium: 3.9 mmol/L (ref 3.5–5.2)
Sodium: 142 mmol/L (ref 134–144)

## 2018-08-29 ENCOUNTER — Telehealth: Payer: Self-pay | Admitting: Emergency Medicine

## 2018-08-29 NOTE — Telephone Encounter (Signed)
Left message for patient to return call regarding medication management with entresto and valsartan.

## 2018-08-31 NOTE — Telephone Encounter (Signed)
Called patient with lab results. Confirmed patient is taking valsartan 80 mg daily until prior auth goes through for entresto. and will decrease lasix to 20 mg daily. Patient will have labs redrawn in one week. No further questions.

## 2018-09-01 MED ORDER — ATORVASTATIN CALCIUM 20 MG PO TABS: 20.0000 mg | ORAL_TABLET | Freq: Every day | ORAL | 3 refills | Status: DC

## 2018-09-01 MED ORDER — AMLODIPINE BESYLATE 10 MG PO TABS: 10.0000 mg | ORAL_TABLET | Freq: Every day | ORAL | 3 refills | Status: DC

## 2018-09-14 ENCOUNTER — Other Ambulatory Visit: Payer: Self-pay

## 2018-09-14 DIAGNOSIS — I5042 Chronic combined systolic (congestive) and diastolic (congestive) heart failure: Secondary | ICD-10-CM

## 2018-09-15 ENCOUNTER — Telehealth: Payer: Self-pay | Admitting: *Deleted

## 2018-09-15 LAB — BASIC METABOLIC PANEL
BUN/Creatinine Ratio: 10 (ref 9–20)
BUN: 9 mg/dL (ref 6–24)
CO2: 24 mmol/L (ref 20–29)
Calcium: 8.9 mg/dL (ref 8.7–10.2)
Chloride: 103 mmol/L (ref 96–106)
Creatinine, Ser: 0.87 mg/dL (ref 0.76–1.27)
GFR calc Af Amer: 120 mL/min/{1.73_m2} (ref >59)
GFR calc non Af Amer: 103 mL/min/{1.73_m2} (ref >59)
Glucose: 113 mg/dL — ABNORMAL HIGH (ref 65–99)
Potassium: 3.7 mmol/L (ref 3.5–5.2)
Sodium: 142 mmol/L (ref 134–144)

## 2018-09-15 NOTE — Telephone Encounter (Signed)
Telephone call to patient . Informed of lab results. Patient is taking Entresto 24/26 BID.

## 2018-09-15 NOTE — Telephone Encounter (Signed)
-----   Message from Park Liter, MD sent at 09/15/2018 12:55 PM EDT ----- chem7 looks good,  Is he taking entresto?  If no needs ot start entresto 24/26 bid and chem7 in 1 week

## 2018-09-27 ENCOUNTER — Telehealth: Payer: Self-pay | Admitting: Cardiology

## 2018-09-27 ENCOUNTER — Other Ambulatory Visit: Payer: Self-pay

## 2018-10-04 ENCOUNTER — Encounter: Payer: Self-pay | Admitting: Cardiology

## 2018-10-04 ENCOUNTER — Telehealth (INDEPENDENT_AMBULATORY_CARE_PROVIDER_SITE_OTHER): Payer: Self-pay | Admitting: Cardiology

## 2018-10-04 ENCOUNTER — Other Ambulatory Visit: Payer: Self-pay

## 2018-10-04 DIAGNOSIS — R0609 Other forms of dyspnea: Secondary | ICD-10-CM

## 2018-10-04 DIAGNOSIS — I10 Essential (primary) hypertension: Secondary | ICD-10-CM

## 2018-10-04 DIAGNOSIS — I5042 Chronic combined systolic (congestive) and diastolic (congestive) heart failure: Secondary | ICD-10-CM

## 2018-10-04 DIAGNOSIS — I420 Dilated cardiomyopathy: Secondary | ICD-10-CM

## 2018-10-04 NOTE — Addendum Note (Signed)
Addended by: Ashok Norris on: 10/04/2018 10:49 AM   Modules accepted: Orders

## 2018-10-04 NOTE — Progress Notes (Signed)
Virtual Visit via Video Note   This visit type was conducted due to national recommendations for restrictions regarding the COVID-19 Pandemic (e.g. social distancing) in an effort to limit this patient's exposure and mitigate transmission in our community.  Due to his co-morbid illnesses, this patient is at least at moderate risk for complications without adequate follow up.  This format is felt to be most appropriate for this patient at this time.  All issues noted in this document were discussed and addressed.  A limited physical exam was performed with this format.  Please refer to the patient's chart for his consent to telehealth for [facility].  Evaluation Performed:  Follow-up visit  This visit type was conducted due to national recommendations for restrictions regarding the COVID-19 Pandemic (e.g. social distancing).  This format is felt to be most appropriate for this patient at this time.  All issues noted in this document were discussed and addressed.  No physical exam was performed (except for noted visual exam findings with Video Visits).  Please refer to the patient's chart (MyChart message for video visits and phone note for telephone visits) for the patient's consent to telehealth for [facility].  Date:  10/04/2018  ID: [name], DOB 31-Dec-1970, MRN [id number]   Patient Location: [address] [place] [address]   Provider location:   [facility] [place] [facility]  PCP:  Inc, [facility]  Cardiologist:  Gypsy Balsam, MD     Chief Complaint: Doing fine  History of Present Illness:    Hever Mishoe is a 48 y.o. male  who presents via audio/video conferencing for a telehealth visit today.  With cardiomyopathy ejection fraction 25 to 30%, I am trying to put him on the right medication the obstacle we are facing was financial problems finally he got Medicaid and now he got insurance.  Finally he was able to afford Entresto.  Today I am  talking to him over the phone and video link.  He is doing well described to have some minimal swelling of lower extremities.  I will ask him to come to my office to have Chem-7 as well as proBNP done.  Based on that we will decide if he can go up with [facility] which I hope will be able to do and if that is the case I will cut down his amlodipine.  The key will be to put him on right medications and then recheck his left ventricular ejection fraction   The patient does not have symptoms concerning for COVID-19 infection (fever, chills, cough, or new SHORTNESS OF BREATH).    Prior CV studies:   The following studies were reviewed today:       Past Medical History:  Diagnosis Date  . Congestive heart failure (CHF) (HCC) 05/06/2018  . Hypertension     Past Surgical History:  Procedure Laterality Date  . HAND SURGERY    . NASAL SEPTUM SURGERY       Current Meds  Medication Sig  . amLODipine (NORVASC) 10 MG tablet Take 1 tablet (10 mg total) by mouth daily.  Marland Kitchen atorvastatin (LIPITOR) 20 MG tablet Take 1 tablet (20 mg total) by mouth daily.  . carvedilol (COREG) 6.25 MG tablet Take 1 tablet (6.25 mg total) by mouth 2 (two) times daily.  . furosemide (LASIX) 40 MG tablet Take 40 mg by mouth daily.  . Loratadine 10 MG CAPS Take 10 mg by mouth daily.   . sacubitril-valsartan (ENTRESTO)  24-26 MG Take 1 tablet by mouth 2 (two) times daily.      Family History: The patient's family history includes Heart attack in his father and mother; Hypertension in his father and mother.   ROS:   Please see the history of present illness.     All other systems reviewed and are negative.   Labs/Other Tests and Data Reviewed:     Recent Labs: 05/06/2018: ALT 36; B Natriuretic Peptide 357.3; Hemoglobin 14.1; Platelets 166 09/14/2018: BUN 9; Creatinine, Ser 0.87; Potassium 3.7; Sodium 142  Recent Lipid Panel    Component Value Date/Time   CHOL 184 01/31/2008 2114   TRIG 62 01/31/2008 2114    HDL 52 01/31/2008 2114   CHOLHDL 3.5 Ratio 01/31/2008 2114   VLDL 12 01/31/2008 2114   LDLCALC 120 (H) 01/31/2008 2114      Exam:    Vital Signs:  There were no vitals taken for this visit.    Wt Readings from Last 3 Encounters:  05/17/18 230 lb 1.9 oz (104.4 kg)  05/06/18 230 lb (104.3 kg)  03/20/18 231 lb (104.8 kg)     Well nourished, well developed in no acute distress. Alert awake and attentive 3 not in any distress talking to me over the video link.  Diagnosis for this visit:   1. Dilated cardiomyopathy ([place])   2. Chronic combined systolic and diastolic congestive heart failure ([place])   3. Essential hypertension   4. Dyspnea on exertion      ASSESSMENT & PLAN:    1.  Dilated cardiomyopathy plan as outlined above gradually trying to put him on right medications. 2.  Congestive heart failure.  Swelling of lower extremities noted.  Could be partially related to Norvasc.  Will try to cut down that medication.  He may require more diuretic 3.  Essential hypertension still elevated try to adjust his medication quickly. 4.  Dyspnea on exertion denies having any  COVID-19 Education: The signs and symptoms of COVID-19 were discussed with the patient and how to seek care for testing (follow up with PCP or arrange E-visit).  The importance of social distancing was discussed today.  Patient Risk:   After full review of this patients clinical status, I feel that they are at least moderate risk at this time.  Time:   Today, I have spent 16 minutes with the patient with telehealth technology discussing pt health issues.  I spent 5 minutes reviewing her chart before the visit.  Visit was finished at 10:15 AM.    Medication Adjustments/Labs and Tests Ordered: Current medicines are reviewed at length with the patient today.  Concerns regarding medicines are outlined above.  No orders of the defined types were placed in this encounter.  Medication changes: No orders of the  defined types were placed in this encounter.    Disposition: Follow-up in 1 month  Signed, Park Liter, MD, [facility] 10/04/2018 10:14 AM    [place]

## 2018-10-04 NOTE — Patient Instructions (Signed)
Medication Instructions:  Your physician recommends that you continue on your current medications as directed. Please refer to the Current Medication list given to you today.  If you need a refill on your cardiac medications before your next appointment, please call your pharmacy.   Lab work: Your physician recommends that you return for lab work in 1 week: bmp,prop bnp   If you have labs (blood work) drawn today and your tests are completely normal, you will receive your results only by: Marland Kitchen MyChart Message (if you have MyChart) OR . A paper copy in the mail If you have any lab test that is abnormal or we need to change your treatment, we will call you to review the results.  Testing/Procedures: None.   Follow-Up: At [facility], you and your health needs are our priority.  As part of our continuing mission to provide you with exceptional heart care, we have created designated Provider Care Teams.  These Care Teams include your primary Cardiologist (physician) and Advanced Practice Providers (APPs -  Physician Assistants and Nurse Practitioners) who all work together to provide you with the care you need, when you need it. You will need a follow up appointment in 1 months.  Please call our office 2 months in advance to schedule this appointment.  You may see Jenne Campus, MD or another member of our [place] Provider Team in [place]: Shirlee More, MD . Jyl Heinz, MD  Any Other Special Instructions Will Be Listed Below (If Applicable).

## 2018-10-07 ENCOUNTER — Telehealth: Payer: Self-pay | Admitting: Emergency Medicine

## 2018-10-07 DIAGNOSIS — I10 Essential (primary) hypertension: Secondary | ICD-10-CM

## 2018-10-07 LAB — BASIC METABOLIC PANEL
BUN/Creatinine Ratio: 12 (ref 9–20)
BUN: 11 mg/dL (ref 6–24)
CO2: 24 mmol/L (ref 20–29)
Calcium: 9.5 mg/dL (ref 8.7–10.2)
Chloride: 102 mmol/L (ref 96–106)
Creatinine, Ser: 0.89 mg/dL (ref 0.76–1.27)
GFR calc Af Amer: 119 mL/min/{1.73_m2} (ref >59)
GFR calc non Af Amer: 103 mL/min/{1.73_m2} (ref >59)
Glucose: 106 mg/dL — ABNORMAL HIGH (ref 65–99)
Potassium: 4.1 mmol/L (ref 3.5–5.2)
Sodium: 142 mmol/L (ref 134–144)

## 2018-10-07 LAB — PRO B NATRIURETIC PEPTIDE: NT-Pro BNP: 74 pg/mL (ref 0–121)

## 2018-10-07 MED ORDER — ENTRESTO 24-26 MG PO TABS: 2.0000 | ORAL_TABLET | Freq: Two times a day (BID) | ORAL | 1 refills | Status: DC

## 2018-10-07 NOTE — Telephone Encounter (Signed)
Patient informed of results advised to increase entresto to 2 tablets daily. Also advised patient to have bmp drawn in 1 week.

## 2018-11-02 ENCOUNTER — Encounter: Payer: Self-pay | Admitting: Cardiology

## 2018-11-02 ENCOUNTER — Other Ambulatory Visit: Payer: Self-pay

## 2018-11-02 ENCOUNTER — Ambulatory Visit (INDEPENDENT_AMBULATORY_CARE_PROVIDER_SITE_OTHER): Payer: Medicaid Other | Admitting: Cardiology

## 2018-11-02 VITALS — BP 140/76 | HR 79 | Ht 72.0 in | Wt 240.0 lb

## 2018-11-02 DIAGNOSIS — I5042 Chronic combined systolic (congestive) and diastolic (congestive) heart failure: Secondary | ICD-10-CM | POA: Diagnosis not present

## 2018-11-02 DIAGNOSIS — I10 Essential (primary) hypertension: Secondary | ICD-10-CM

## 2018-11-02 DIAGNOSIS — I420 Dilated cardiomyopathy: Secondary | ICD-10-CM

## 2018-11-02 DIAGNOSIS — R0609 Other forms of dyspnea: Secondary | ICD-10-CM | POA: Diagnosis not present

## 2018-11-02 MED ORDER — CARVEDILOL 12.5 MG PO TABS: 12.5000 mg | ORAL_TABLET | Freq: Two times a day (BID) | ORAL | 1 refills | Status: DC

## 2018-11-02 NOTE — Addendum Note (Signed)
Addended by: Ashok Norris on: 11/02/2018 10:45 AM   Modules accepted: Orders

## 2018-11-02 NOTE — Progress Notes (Signed)
Cardiology Office Note:    Date:  11/02/2018   ID:  Jeffery Glenn, DOB 1970/04/13, MRN [id number]  PCP:  [facility], [facility]  Cardiologist:  Gypsy Balsam, MD    Referring MD: Inc, [facility]*   Chief Complaint  Patient presents with  . Follow-up  Doing better  History of Present Illness:    Jeffery Glenn is a 47 y.o. male with history of cardiomyopathy severely diminished left ventricular ejection fraction.  Gradually and try to put him on the right medications.  Initially we faced the situation he did not have any insurance he could not afford appropriate medications now he does have Medicaid he is aware on beta-blocker Entresto and doing much better.  He is having less shortness of breath no swelling of lower extremities is able to do more still get some exertional shortness of breath.  Past Medical History:  Diagnosis Date  . Congestive heart failure (CHF) (HCC) 05/06/2018  . Hypertension     Past Surgical History:  Procedure Laterality Date  . HAND SURGERY    . NASAL SEPTUM SURGERY      Current Medications: Current Meds  Medication Sig  . amLODipine (NORVASC) 10 MG tablet Take 1 tablet (10 mg total) by mouth daily.  Marland Kitchen atorvastatin (LIPITOR) 20 MG tablet Take 1 tablet (20 mg total) by mouth daily.  . carvedilol (COREG) 6.25 MG tablet Take 1 tablet (6.25 mg total) by mouth 2 (two) times daily.  . furosemide (LASIX) 40 MG tablet Take 40 mg by mouth daily.  . Loratadine 10 MG CAPS Take 10 mg by mouth daily.   . sacubitril-valsartan (ENTRESTO) 24-26 MG Take 2 tablets by mouth 2 (two) times daily.     Allergies:   Shellfish allergy and Peanut-containing drug products   Social History   Socioeconomic History  . Marital status: Married    Spouse name: Not on file  . Number of children: Not on file  . Years of education: Not on file  . Highest education level: Not on file  Occupational History  . Not on file  Social Needs  . Financial  resource strain: Not on file  . Food insecurity    Worry: Not on file    Inability: Not on file  . Transportation needs    Medical: Not on file    Non-medical: Not on file  Tobacco Use  . Smoking status: Never Smoker  . Smokeless tobacco: Never Used  Substance and Sexual Activity  . Alcohol use: Yes  . Drug use: No  . Sexual activity: Not on file  Lifestyle  . Physical activity    Days per week: Not on file    Minutes per session: Not on file  . Stress: Not on file  Relationships  . Social Musician on phone: Not on file    Gets together: Not on file    Attends religious service: Not on file    Active member of club or organization: Not on file    Attends meetings of clubs or organizations: Not on file    Relationship status: Not on file  Other Topics Concern  . Not on file  Social History Narrative  . Not on file     Family History: The patient's family history includes Heart attack in his father and mother; Hypertension in his father and mother. ROS:   Please see the history of present illness.    All 14 point review  of systems negative except as described per history of present illness  EKGs/Labs/Other Studies Reviewed:      Recent Labs: 05/06/2018: ALT 36; B Natriuretic Peptide 357.3; Hemoglobin 14.1; Platelets 166 10/06/2018: BUN 11; Creatinine, Ser 0.89; NT-Pro BNP 74; Potassium 4.1; Sodium 142  Recent Lipid Panel    Component Value Date/Time   CHOL 184 01/31/2008 2114   TRIG 62 01/31/2008 2114   HDL 52 01/31/2008 2114   CHOLHDL 3.5 Ratio 01/31/2008 2114   VLDL 12 01/31/2008 2114   LDLCALC 120 (H) 01/31/2008 2114    Physical Exam:    VS:  BP 140/76   Pulse 79   Ht 6' (1.829 m)   Wt 240 lb (108.9 kg)   SpO2 98%   BMI 32.55 kg/m     Wt Readings from Last 3 Encounters:  11/02/18 240 lb (108.9 kg)  05/17/18 230 lb 1.9 oz (104.4 kg)  05/06/18 230 lb (104.3 kg)     GEN:  Well nourished, well developed in no acute distress HEENT:  Normal NECK: No JVD; No carotid bruits LYMPHATICS: No lymphadenopathy CARDIAC: RRR, no murmurs, no rubs, no gallops RESPIRATORY:  Clear to auscultation without rales, wheezing or rhonchi  ABDOMEN: Soft, non-tender, non-distended MUSCULOSKELETAL:  No edema; No deformity  SKIN: Warm and dry LOWER EXTREMITIES: no swelling NEUROLOGIC:  Alert and oriented x 3 PSYCHIATRIC:  Normal affect   ASSESSMENT:    1. Dilated cardiomyopathy ([place])   2. Chronic combined systolic and diastolic congestive heart failure ([place])   3. Essential hypertension   4. Dyspnea on exertion    PLAN:    In order of problems listed above:  1. Dilated cardiomyopathy will do EKG today to see if we can increase dose of carvedilol.  He is already on decent dose of Entresto I will check Chem-7 today.  In the future we will add Aldactone 2. Chronic combined systolic and diastolic congestive heart failure.  Compensated doing well 3. Essential hypertension blood pressure still on the lower side but will increase dose of beta-blocker 4. Dyspnea exertion improved significantly.   Medication Adjustments/Labs and Tests Ordered: Current medicines are reviewed at length with the patient today.  Concerns regarding medicines are outlined above.  No orders of the defined types were placed in this encounter.  Medication changes: No orders of the defined types were placed in this encounter.   Signed, Park Liter, MD, [facility] 11/02/2018 10:34 AM    [place]

## 2018-11-02 NOTE — Patient Instructions (Signed)
Medication Instructions:   Your physician has recommended you make the following change in your medication:   INCREASE: Carvedilol 12.5 mg twice daily   Lab work: Your physician recommends that you return for lab work today: bmp   If you have labs (blood work) drawn today and your tests are completely normal, you will receive your results only by: Marland Kitchen MyChart Message (if you have MyChart) OR . A paper copy in the mail If you have any lab test that is abnormal or we need to change your treatment, we will call you to review the results.  Testing/Procedures: None.   Follow-Up: At [facility], you and your health needs are our priority.  As part of our continuing mission to provide you with exceptional heart care, we have created designated Provider Care Teams.  These Care Teams include your primary Cardiologist (physician) and Advanced Practice Providers (APPs -  Physician Assistants and Nurse Practitioners) who all work together to provide you with the care you need, when you need it. You will need a follow up appointment in 3 weeks.  Please call our office 2 months in advance to schedule this appointment.  You may see Jenne Campus, MD or another member of our [place] Provider Team in [place]: Shirlee More, MD . Jyl Heinz, MD  Any Other Special Instructions Will Be Listed Below (If Applicable).

## 2018-11-03 LAB — BASIC METABOLIC PANEL
BUN/Creatinine Ratio: 15 (ref 9–20)
BUN: 14 mg/dL (ref 6–24)
CO2: 24 mmol/L (ref 20–29)
Calcium: 9.1 mg/dL (ref 8.7–10.2)
Chloride: 101 mmol/L (ref 96–106)
Creatinine, Ser: 0.95 mg/dL (ref 0.76–1.27)
GFR calc Af Amer: 111 mL/min/{1.73_m2} (ref >59)
GFR calc non Af Amer: 96 mL/min/{1.73_m2} (ref >59)
Glucose: 118 mg/dL — ABNORMAL HIGH (ref 65–99)
Potassium: 3.4 mmol/L — ABNORMAL LOW (ref 3.5–5.2)
Sodium: 142 mmol/L (ref 134–144)

## 2018-11-04 ENCOUNTER — Telehealth: Payer: Self-pay | Admitting: Emergency Medicine

## 2018-11-04 NOTE — Telephone Encounter (Signed)
Left message for patient to return call regarding lab results.  

## 2018-11-24 ENCOUNTER — Other Ambulatory Visit: Payer: Self-pay

## 2018-11-24 ENCOUNTER — Ambulatory Visit (INDEPENDENT_AMBULATORY_CARE_PROVIDER_SITE_OTHER): Payer: Medicaid Other | Admitting: Cardiology

## 2018-11-24 ENCOUNTER — Encounter: Payer: Self-pay | Admitting: Cardiology

## 2018-11-24 VITALS — BP 150/80 | HR 66 | Ht 72.0 in | Wt 238.0 lb

## 2018-11-24 DIAGNOSIS — I10 Essential (primary) hypertension: Secondary | ICD-10-CM | POA: Diagnosis not present

## 2018-11-24 DIAGNOSIS — I420 Dilated cardiomyopathy: Secondary | ICD-10-CM

## 2018-11-24 DIAGNOSIS — R0609 Other forms of dyspnea: Secondary | ICD-10-CM | POA: Diagnosis not present

## 2018-11-24 DIAGNOSIS — I5042 Chronic combined systolic (congestive) and diastolic (congestive) heart failure: Secondary | ICD-10-CM | POA: Diagnosis not present

## 2018-11-24 MED ORDER — ENTRESTO 97-103 MG PO TABS: 1.0000 | ORAL_TABLET | Freq: Two times a day (BID) | ORAL | 2 refills | Status: DC

## 2018-11-24 NOTE — Patient Instructions (Signed)
Medication Instructions:  Your physician has recommended you make the following change in your medication:   INCREASE: ENTRESTO to 97/103 mg twice daily.   If you need a refill on your cardiac medications before your next appointment, please call your pharmacy.   Lab work: Your physician recommends that you return for lab work in 1 week: BMP   If you have labs (blood work) drawn today and your tests are completely normal, you will receive your results only by: Marland Kitchen MyChart Message (if you have MyChart) OR . A paper copy in the mail If you have any lab test that is abnormal or we need to change your treatment, we will call you to review the results.  Testing/Procedures: None.    Follow-Up: At [facility], you and your health needs are our priority.  As part of our continuing mission to provide you with exceptional heart care, we have created designated Provider Care Teams.  These Care Teams include your primary Cardiologist (physician) and Advanced Practice Providers (APPs -  Physician Assistants and Nurse Practitioners) who all work together to provide you with the care you need, when you need it. You will need a follow up appointment in 2 months.  Please call our office 2 months in advance to schedule this appointment.  You may see Jenne Campus, MD or another member of our [place] Provider Team in [place]: Shirlee More, MD . Jyl Heinz, MD  Any Other Special Instructions Will Be Listed Below (If Applicable).

## 2018-11-24 NOTE — Progress Notes (Signed)
Cardiology Office Note:    Date:  11/24/2018   ID:  Jeffery Glenn, DOB 08-12-1970, MRN [id number]  PCP:  [facility]  Cardiologist:  Jenne Campus, MD    Referring MD: Inc, [facility]*   Chief Complaint  Patient presents with  . Follow-up  Doing very well  History of Present Illness:    Jeffery Glenn is a 48 y.o. male with cardiomyopathy we gradually trying to put him on right medications.  Today what I will do is increase his dose of Entresto he is being already taking 4952 twice daily we will go to 103/97 twice a day.  Will also check his Chem-7 within next few days.  That move should help with blood pressure as well as with his potassium that was slightly low last time on Chem-7.  Overall he is doing well denies have any chest pain tightness squeezing pressure been chest no swelling of lower extremities no palpitations no dizziness  Past Medical History:  Diagnosis Date  . Congestive heart failure (CHF) ([place]) 05/06/2018  . Hypertension     Past Surgical History:  Procedure Laterality Date  . HAND SURGERY    . NASAL SEPTUM SURGERY      Current Medications: Current Meds  Medication Sig  . amLODipine (NORVASC) 10 MG tablet Take 1 tablet (10 mg total) by mouth daily.  Marland Kitchen atorvastatin (LIPITOR) 20 MG tablet Take 1 tablet (20 mg total) by mouth daily.  . carvedilol (COREG) 12.5 MG tablet Take 1 tablet (12.5 mg total) by mouth 2 (two) times daily.  . fluticasone (FLONASE) 50 MCG/ACT nasal spray Place 1-2 sprays into both nostrils as needed.  . furosemide (LASIX) 40 MG tablet Take 40 mg by mouth daily.  . Loratadine 10 MG CAPS Take 10 mg by mouth daily.   . sacubitril-valsartan (ENTRESTO) 24-26 MG Take 2 tablets by mouth 2 (two) times daily.     Allergies:   Shellfish allergy and Peanut-containing drug products   Social History   Socioeconomic History  . Marital status: Married    Spouse name: Not on file  . Number of children: Not on file   . Years of education: Not on file  . Highest education level: Not on file  Occupational History  . Not on file  Social Needs  . Financial resource strain: Not on file  . Food insecurity    Worry: Not on file    Inability: Not on file  . Transportation needs    Medical: Not on file    Non-medical: Not on file  Tobacco Use  . Smoking status: Never Smoker  . Smokeless tobacco: Never Used  Substance and Sexual Activity  . Alcohol use: Yes  . Drug use: No  . Sexual activity: Not on file  Lifestyle  . Physical activity    Days per week: Not on file    Minutes per session: Not on file  . Stress: Not on file  Relationships  . Social Herbalist on phone: Not on file    Gets together: Not on file    Attends religious service: Not on file    Active member of club or organization: Not on file    Attends meetings of clubs or organizations: Not on file    Relationship status: Not on file  Other Topics Concern  . Not on file  Social History Narrative  . Not on file     Family History: The  patient's family history includes Heart attack in his father and mother; Hypertension in his father and mother. ROS:   Please see the history of present illness.    All 14 point review of systems negative except as described per history of present illness  EKGs/Labs/Other Studies Reviewed:      Recent Labs: 05/06/2018: ALT 36; B Natriuretic Peptide 357.3; Hemoglobin 14.1; Platelets 166 10/06/2018: NT-Pro BNP 74 11/02/2018: BUN 14; Creatinine, Ser 0.95; Potassium 3.4; Sodium 142  Recent Lipid Panel    Component Value Date/Time   CHOL 184 01/31/2008 2114   TRIG 62 01/31/2008 2114   HDL 52 01/31/2008 2114   CHOLHDL 3.5 Ratio 01/31/2008 2114   VLDL 12 01/31/2008 2114   LDLCALC 120 (H) 01/31/2008 2114    Physical Exam:    VS:  BP (!) 150/80   Pulse 66   Ht 6' (1.829 m)   Wt 238 lb (108 kg)   SpO2 98%   BMI 32.28 kg/m     Wt Readings from Last 3 Encounters:  11/24/18 238  lb (108 kg)  11/02/18 240 lb (108.9 kg)  05/17/18 230 lb 1.9 oz (104.4 kg)     GEN:  Well nourished, well developed in no acute distress HEENT: Normal NECK: No JVD; No carotid bruits LYMPHATICS: No lymphadenopathy CARDIAC: RRR, no murmurs, no rubs, no gallops RESPIRATORY:  Clear to auscultation without rales, wheezing or rhonchi  ABDOMEN: Soft, non-tender, non-distended MUSCULOSKELETAL:  No edema; No deformity  SKIN: Warm and dry LOWER EXTREMITIES: no swelling NEUROLOGIC:  Alert and oriented x 3 PSYCHIATRIC:  Normal affect   ASSESSMENT:    1. Dilated cardiomyopathy (HCC)   2. Chronic combined systolic and diastolic congestive heart failure (HCC)   3. Essential hypertension   4. Dyspnea on exertion    PLAN:    In order of problems listed above:  1. Dilated cardiomyopathy plan as outlined above. 2. Chronic systolic diastolic congestive heart failure New [place] Heart Association class I.  We will continue present management. 3. Essential hypertension still elevated hopefully will be better controlled with higher dose of Entresto. 4. Dyspnea on exertion doing well from that point review.   Medication Adjustments/Labs and Tests Ordered: Current medicines are reviewed at length with the patient today.  Concerns regarding medicines are outlined above.  No orders of the defined types were placed in this encounter.  Medication changes: No orders of the defined types were placed in this encounter.   Signed, Georgeanna Lea, MD, [facility] 11/24/2018 9:18 AM    [facility]

## 2018-11-24 NOTE — Addendum Note (Signed)
Addended by: Ashok Norris on: 11/24/2018 09:27 AM   Modules accepted: Orders

## 2019-01-23 ENCOUNTER — Encounter: Payer: Self-pay | Admitting: Family

## 2019-01-23 ENCOUNTER — Other Ambulatory Visit: Payer: Self-pay

## 2019-01-23 ENCOUNTER — Ambulatory Visit (INDEPENDENT_AMBULATORY_CARE_PROVIDER_SITE_OTHER): Payer: Medicaid Other | Admitting: Family

## 2019-01-23 VITALS — BP 156/90 | HR 72 | Resp 95 | Ht 72.0 in | Wt 238.5 lb

## 2019-01-23 DIAGNOSIS — I340 Nonrheumatic mitral (valve) insufficiency: Secondary | ICD-10-CM

## 2019-01-23 DIAGNOSIS — E782 Mixed hyperlipidemia: Secondary | ICD-10-CM | POA: Diagnosis not present

## 2019-01-23 DIAGNOSIS — I5042 Chronic combined systolic (congestive) and diastolic (congestive) heart failure: Secondary | ICD-10-CM

## 2019-01-23 DIAGNOSIS — I420 Dilated cardiomyopathy: Secondary | ICD-10-CM

## 2019-01-23 MED ORDER — CARVEDILOL 12.5 MG PO TABS: 12.5000 mg | ORAL_TABLET | Freq: Two times a day (BID) | ORAL | 1 refills | Status: DC

## 2019-01-23 MED ORDER — SPIRONOLACTONE 25 MG PO TABS: 12.5000 mg | ORAL_TABLET | Freq: Every day | ORAL | 1 refills | Status: DC

## 2019-01-23 NOTE — Progress Notes (Signed)
Office Visit    Patient Name: Jeffery Glenn Date of Encounter: 01/23/2019  Primary Care Provider:  [facility] Primary Cardiologist:  Gypsy Balsam, MD Electrophysiologist:  None   Chief Complaint    Jeffery Glenn is a 48 y.o. 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   Past Medical History    Past Medical History:  Diagnosis Date  . Congestive heart failure (CHF) (HCC) 05/06/2018  . Hypertension    Past Surgical History:  Procedure Laterality Date  . HAND SURGERY    . NASAL SEPTUM SURGERY     Allergies  Allergies  Allergen Reactions  . Shellfish Allergy Other (See Comments)    hives  . Peanut-Containing Drug Products     History of Present Illness    Jeffery Glenn is a 47 y.o. male with a hx of cardiomyopathy, hypertensive heart disease with combined systolic and diastolic heart failure last seen 11/24/18 by Dr. Bing Matter.  Reports feeling overall okay. No SOB no DOE. He is learning to pace himself and tells me this has helped him. No orthopnea, no PND.   Endorses some dietary indiscretion. Verbalizes need to change his diet. Tells me he has been pretty heavy on the fried foods. He has laid off the pork a little bit. He has purchased an air fryer to utilize less oil and encouraged to use it. Has been drinking sodas (ginger ale, pepsi) and lemonade. We discussed the sodium content of soda. Encouraged switch to water and he is agreeable to try.   Just fixed his treadmill and plans to utilize. Encouraged regular exercise regimen.    EKGs/Labs/Other Studies Reviewed:   The following studies were reviewed today: Echo 05/2018  1. The left ventricle has severely reduced systolic function, with an ejection fraction of 25-30%. The cavity size was normal. There is moderately increased left ventricular wall thickness. Left ventricular diastolic  Doppler parameters are consistent  with pseudonormalization. Left ventricular diffuse hypokinesis.  2. The right ventricle has normal systolic function. The cavity was normal. There is no increase in right ventricular wall thickness.  3. Left atrial size was moderately dilated.  4. Mitral valve regurgitation is mild to moderate by color flow Doppler.  5. The aortic valve is tricuspid Aortic valve regurgitation was not assessed by color flow Doppler.  EKG:  No EKG today.  Recent Labs: 05/06/2018: ALT 36; B Natriuretic Peptide 357.3; Hemoglobin 14.1; Platelets 166 10/06/2018: NT-Pro BNP 74 11/02/2018: BUN 14; Creatinine, Ser 0.95; Potassium 3.4; Sodium 142  Recent Lipid Panel    Component Value Date/Time   CHOL 184 01/31/2008 2114   TRIG 62 01/31/2008 2114   HDL 52 01/31/2008 2114   CHOLHDL 3.5 Ratio 01/31/2008 2114   VLDL 12 01/31/2008 2114   LDLCALC 120 (H) 01/31/2008 2114    Home Medications   Current Meds  Medication Sig  . amLODipine (NORVASC) 10 MG tablet Take 1 tablet (10 mg total) by mouth daily.  Marland Kitchen atorvastatin (LIPITOR) 20 MG tablet Take 1 tablet (20 mg total) by mouth daily.  . carvedilol (COREG) 12.5 MG tablet Take 1 tablet (12.5 mg total) by mouth 2 (two) times daily.  . fluticasone (FLONASE) 50 MCG/ACT nasal spray Place 1-2 sprays into both nostrils as needed.  . furosemide (LASIX) 40 MG tablet Take 40 mg by mouth daily.  . Loratadine 10 MG CAPS Take 10 mg by  mouth daily.   . sacubitril-valsartan (ENTRESTO) 97-103 MG Take 1 tablet by mouth 2 (two) times daily.  . [DISCONTINUED] carvedilol (COREG) 12.5 MG tablet Take 1 tablet (12.5 mg total) by mouth 2 (two) times daily.      Review of Systems      Review of Systems  Constitution: Negative for chills, fever and malaise/fatigue.  Cardiovascular: Negative for chest pain, dyspnea on exertion, leg swelling and near-syncope.  Respiratory: Negative for cough, shortness of breath and wheezing.   Gastrointestinal:  Negative for nausea and vomiting.  Neurological: Negative for dizziness, light-headedness and weakness.   All other systems reviewed and are otherwise negative except as noted above.  Physical Exam    VS:  BP (!) 156/90 (BP Location: Left Arm, Patient Position: Sitting, Cuff Size: Normal)   Pulse 72   Resp (!) 95   Ht 6' (1.829 m)   Wt 238 lb 8 oz (108.2 kg)   BMI 32.35 kg/m  , BMI Body mass index is 32.35 kg/m. GEN: Well nourished, well developed, in no acute distress. HEENT: normal. Neck: Supple, no JVD, carotid bruits, or masses. Cardiac: RRR, no murmurs, rubs, or gallops. No clubbing, cyanosis, edema.  Radials/DP/PT 2+ and equal bilaterally.  Respiratory:  Respirations regular and unlabored, clear to auscultation bilaterally. GI: Soft, nontender, nondistended, BS + x 4. MS: No deformity or atrophy. Skin: Warm and dry, no rash. Neuro:  Strength and sensation are intact. Psych: Normal affect.  Assessment & Plan    1. Dilated cardiomyopathy - Echo 05/2018 EF 25-30%, mod increase LV wall thickness, pseudonormal LV doppler parameters. NYHA I-II with no SOB, DOE, no edema. Does report "taking it easy" to avoid some of these symptoms. He is euvolemic on exam.  Continue GDMT Entresto 97/106mg  BID, Carvedilol 12.5mg  BID, Lasix 40mg .   START Spironolactone 12.5mg  BID.  BMET today and repeat in 2 weeks.    Consider increased Carvedilol dose at next office visit pending improvement in BP.   2. Hypertensive heart disease with combined systolic and diastolic heart failure - BP elevated. Checks intermittently at home, encouraged to check regularly. Reassess BP with addition of Spironolactone today. Consider increase Carvedilol dose at next visit.  Low sodium, heart healthy diet. Tells me he will work to adhere.  Encourage regular cardiovascular exercise.   3. HLD - Continue Atorvastatin.  4. 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 Continue optimization of BP and fluid  volume.   Disposition: Follow up in 6 week(s) with Dr. Agustin Cree.   Loel Dubonnet, NP 01/23/2019, 9:22 AM

## 2019-01-23 NOTE — Patient Instructions (Addendum)
Medication Instructions:   START Spironolactone 12.5mg  (half tablet) once daily.   *If you need a refill on your cardiac medications before your next appointment, please call your pharmacy*  Lab Work: Your physician recommends that you return for lab work today: BMET  Please return for lab work in 2 weeks. You do not need to be fasting. You do not need an appointment. Come by the office 8 am - 4:30 pm, but avoid the 12p-1p hour.   If you have labs (blood work) drawn today and your tests are completely normal, you will receive your results only by: Marland Kitchen MyChart Message (if you have MyChart) OR . A paper copy in the mail If you have any lab test that is abnormal or we need to change your treatment, we will call you to review the results.  Testing/Procedures: None today  Follow-Up: At [facility], you and your health needs are our priority.  As part of our continuing mission to provide you with exceptional heart care, we have created designated Provider Care Teams.  These Care Teams include your primary Cardiologist (physician) and Advanced Practice Providers (APPs -  Physician Assistants and Nurse Practitioners) who all work together to provide you with the care you need, when you need it.  Your next appointment:   6 weeks  The format for your next appointment:   In Person  Provider:   Gypsy Balsam, MD  Other Instructions   Low-Sodium Eating Plan Sodium, which is an element that makes up salt, helps you maintain a healthy balance of fluids in your body. Too much sodium can increase your blood pressure and cause fluid and waste to be held in your body. Your health care provider or dietitian may recommend following this plan if you have high blood pressure (hypertension), kidney disease, liver disease, or heart failure. Eating less sodium can help lower your blood pressure, reduce swelling, and protect your heart, liver, and kidneys. What are tips for following this plan?  General guidelines  Most people on this plan should limit their sodium intake to 1,500-2,000 mg (milligrams) of sodium each day. Reading food labels   The Nutrition Facts label lists the amount of sodium in one serving of the food. If you eat more than one serving, you must multiply the listed amount of sodium by the number of servings.  Choose foods with less than 140 mg of sodium per serving.  Avoid foods with 300 mg of sodium or more per serving. Shopping  Look for lower-sodium products, often labeled as "low-sodium" or "no salt added."  Always check the sodium content even if foods are labeled as "unsalted" or "no salt added".  Buy fresh foods. ? Avoid canned foods and premade or frozen meals. ? Avoid canned, cured, or processed meats  Buy breads that have less than 80 mg of sodium per slice. Cooking  Eat more home-cooked food and less restaurant, buffet, and fast food.  Avoid adding salt when cooking. Use salt-free seasonings or herbs instead of table salt or sea salt. Check with your health care provider or pharmacist before using salt substitutes.  Cook with plant-based oils, such as canola, sunflower, or olive oil. Meal planning  When eating at a restaurant, ask that your food be prepared with less salt or no salt, if possible.  Avoid foods that contain MSG (monosodium glutamate). MSG is sometimes added to [place] food, bouillon, and some canned foods. What foods are recommended? The items listed may not be a complete list.  Talk with your dietitian about what dietary choices are best for you. Grains Low-sodium cereals, including oats, puffed wheat and rice, and shredded wheat. Low-sodium crackers. Unsalted rice. Unsalted pasta. Low-sodium bread. Whole-grain breads and whole-grain pasta. Vegetables Fresh or frozen vegetables. "No salt added" canned vegetables. "No salt added" tomato sauce and paste. Low-sodium or reduced-sodium tomato and vegetable juice. Fruits  Fresh, frozen, or canned fruit. Fruit juice. Meats and other protein foods Fresh or frozen (no salt added) meat, poultry, seafood, and fish. Low-sodium canned tuna and salmon. Unsalted nuts. Dried peas, beans, and lentils without added salt. Unsalted canned beans. Eggs. Unsalted nut butters. Dairy Milk. Soy milk. Cheese that is naturally low in sodium, such as ricotta cheese, fresh mozzarella, or Swiss cheese Low-sodium or reduced-sodium cheese. Cream cheese. Yogurt. Fats and oils Unsalted butter. Unsalted margarine with no trans fat. Vegetable oils such as canola or olive oils. Seasonings and other foods Fresh and dried herbs and spices. Salt-free seasonings. Low-sodium mustard and ketchup. Sodium-free salad dressing. Sodium-free light mayonnaise. Fresh or refrigerated horseradish. Lemon juice. Vinegar. Homemade, reduced-sodium, or low-sodium soups. Unsalted popcorn and pretzels. Low-salt or salt-free chips. What foods are not recommended? The items listed may not be a complete list. Talk with your dietitian about what dietary choices are best for you. Grains Instant hot cereals. Bread stuffing, pancake, and biscuit mixes. Croutons. Seasoned rice or pasta mixes. Noodle soup cups. Boxed or frozen macaroni and cheese. Regular salted crackers. Self-rising flour. Vegetables Sauerkraut, pickled vegetables, and relishes. Olives. [place] fries. Onion rings. Regular canned vegetables (not low-sodium or reduced-sodium). Regular canned tomato sauce and paste (not low-sodium or reduced-sodium). Regular tomato and vegetable juice (not low-sodium or reduced-sodium). Frozen vegetables in sauces. Meats and other protein foods Meat or fish that is salted, canned, smoked, spiced, or pickled. Bacon, ham, sausage, hotdogs, corned beef, chipped beef, packaged lunch meats, salt pork, jerky, pickled herring, anchovies, regular canned tuna, sardines, salted nuts. Dairy Processed cheese and cheese spreads. Cheese curds.  Blue cheese. Feta cheese. String cheese. Regular cottage cheese. Buttermilk. Canned milk. Fats and oils Salted butter. Regular margarine. Ghee. Bacon fat. Seasonings and other foods Onion salt, garlic salt, seasoned salt, table salt, and sea salt. Canned and packaged gravies. Worcestershire sauce. Tartar sauce. Barbecue sauce. Teriyaki sauce. Soy sauce, including reduced-sodium. Steak sauce. Fish sauce. Oyster sauce. Cocktail sauce. Horseradish that you find on the shelf. Regular ketchup and mustard. Meat flavorings and tenderizers. Bouillon cubes. Hot sauce and Tabasco sauce. Premade or packaged marinades. Premade or packaged taco seasonings. Relishes. Regular salad dressings. Salsa. Potato and tortilla chips. Corn chips and puffs. Salted popcorn and pretzels. Canned or dried soups. Pizza. Frozen entrees and pot pies. Summary  Eating less sodium can help lower your blood pressure, reduce swelling, and protect your heart, liver, and kidneys.  Most people on this plan should limit their sodium intake to 1,500-2,000 mg (milligrams) of sodium each day.  Canned, boxed, and frozen foods are high in sodium. Restaurant foods, fast foods, and pizza are also very high in sodium. You also get sodium by adding salt to food.  Try to cook at home, eat more fresh fruits and vegetables, and eat less fast food, canned, processed, or prepared foods. This information is not intended to replace advice given to you by your health care provider. Make sure you discuss any questions you have with your health care provider. Document Released: 08/15/2001 Document Revised: 02/05/2017 Document Reviewed: 02/17/2016 Elsevier Patient Education  [place].  Spironolactone tablets  What is this medicine? SPIRONOLACTONE (speer on [place] LAK tone) is a diuretic. It helps you make more urine and to lose excess water from your body. This medicine is used to treat high blood pressure, and edema or swelling from heart, kidney, or  liver disease. It is also used to treat patients who make too much aldosterone or have low potassium. This medicine may be used for other purposes; ask your health care provider or pharmacist if you have questions. COMMON BRAND NAME(S): Aldactone What should I tell my health care provider before I take this medicine? They need to know if you have any of these conditions:  high blood level of potassium  kidney disease or trouble making urine  liver disease  an unusual or allergic reaction to spironolactone, other medicines, foods, dyes, or preservatives  pregnant or trying to get pregnant  breast-feeding How should I use this medicine? Take this medicine by mouth with a drink of water. Follow the directions on your prescription label. You can take it with or without food. If it upsets your stomach, take it with food. Do not take your medicine more often than directed. Remember that you will need to pass more urine after taking this medicine. Do not take your doses at a time of day that will cause you problems. Do not take at bedtime. Talk to your pediatrician regarding the use of this medicine in children. While this drug may be prescribed for selected conditions, precautions do apply. Overdosage: If you think you have taken too much of this medicine contact a poison control center or emergency room at once. NOTE: This medicine is only for you. Do not share this medicine with others. What if I miss a dose? If you miss a dose, take it as soon as you can. If it is almost time for your next dose, take only that dose. Do not take double or extra doses. What may interact with this medicine? Do not take this medicine with any of the following medications:  cidofovir  eplerenone  tranylcypromine This medicine may also interact with the following medications:  aspirin  certain medicines for blood pressure or heart disease like benazepril, lisinopril, losartan, valsartan  certain  medicines that treat or prevent blood clots like heparin and enoxaparin  cholestyramine  cyclosporine  digoxin  lithium  medicines that relax muscles for surgery  NSAIDs, medicines for pain and inflammation, like ibuprofen or naproxen  other diuretics  potassium supplements  steroid medicines like prednisone or cortisone  trimethoprim This list may not describe all possible interactions. Give your health care provider a list of all the medicines, herbs, non-prescription drugs, or dietary supplements you use. Also tell them if you smoke, drink alcohol, or use illegal drugs. Some items may interact with your medicine. What should I watch for while using this medicine? Visit your doctor or health care professional for regular checks on your progress. Check your blood pressure as directed. Ask your doctor what your blood pressure should be, and when you should contact them. You may need to be on a special diet while taking this medicine. Ask your doctor. Also, ask how many glasses of fluid you need to drink a day. You must not get dehydrated. This medicine may make you feel confused, dizzy or lightheaded. Drinking alcohol and taking some medicines can make this worse. Do not drive, use machinery, or do anything that needs mental alertness until you know how this medicine affects you. Do not sit or stand  up quickly. What side effects may I notice from receiving this medicine? Side effects that you should report to your doctor or health care professional as soon as possible:  allergic reactions such as skin rash or itching, hives, swelling of the lips, mouth, tongue, or throat  black or tarry stools  fast, irregular heartbeat  fever  muscle pain, cramps  numbness, tingling in hands or feet  trouble breathing  trouble passing urine  unusual bleeding  unusually weak or tired Side effects that usually do not require medical attention (report to your doctor or health care  professional if they continue or are bothersome):  change in voice or hair growth  confusion  dizzy, drowsy  dry mouth, increased thirst  enlarged or tender breasts  headache  irregular menstrual periods  sexual difficulty, unable to have an erection  stomach upset This list may not describe all possible side effects. Call your doctor for medical advice about side effects. You may report side effects to FDA at 1-800-FDA-1088. Where should I keep my medicine? Keep out of the reach of children. Store below 25 degrees C (77 degrees F). Throw away any unused medicine after the expiration date. NOTE: This sheet is a summary. It may not cover all possible information. If you have questions about this medicine, talk to your doctor, pharmacist, or health care provider.  2020 Elsevier/Gold Standard (2016-07-10 09:42:28)

## 2019-01-24 ENCOUNTER — Ambulatory Visit: Payer: Medicaid Other | Admitting: Family

## 2019-01-24 LAB — BASIC METABOLIC PANEL
BUN/Creatinine Ratio: 11 (ref 9–20)
BUN: 9 mg/dL (ref 6–24)
CO2: 25 mmol/L (ref 20–29)
Calcium: 9.6 mg/dL (ref 8.7–10.2)
Chloride: 100 mmol/L (ref 96–106)
Creatinine, Ser: 0.83 mg/dL (ref 0.76–1.27)
GFR calc Af Amer: 122 mL/min/{1.73_m2} (ref >59)
GFR calc non Af Amer: 106 mL/min/{1.73_m2} (ref >59)
Glucose: 125 mg/dL — ABNORMAL HIGH (ref 65–99)
Potassium: 4.1 mmol/L (ref 3.5–5.2)
Sodium: 139 mmol/L (ref 134–144)

## 2019-03-07 ENCOUNTER — Ambulatory Visit (INDEPENDENT_AMBULATORY_CARE_PROVIDER_SITE_OTHER): Payer: Medicaid Other | Admitting: Cardiology

## 2019-03-07 ENCOUNTER — Encounter: Payer: Self-pay | Admitting: Cardiology

## 2019-03-07 ENCOUNTER — Other Ambulatory Visit: Payer: Self-pay

## 2019-03-07 VITALS — BP 124/76 | HR 81 | Ht 72.0 in | Wt 237.0 lb

## 2019-03-07 DIAGNOSIS — R0600 Dyspnea, unspecified: Secondary | ICD-10-CM | POA: Diagnosis not present

## 2019-03-07 DIAGNOSIS — I420 Dilated cardiomyopathy: Secondary | ICD-10-CM

## 2019-03-07 DIAGNOSIS — I5042 Chronic combined systolic (congestive) and diastolic (congestive) heart failure: Secondary | ICD-10-CM | POA: Diagnosis not present

## 2019-03-07 DIAGNOSIS — I10 Essential (primary) hypertension: Secondary | ICD-10-CM

## 2019-03-07 DIAGNOSIS — R0609 Other forms of dyspnea: Secondary | ICD-10-CM

## 2019-03-07 MED ORDER — CARVEDILOL 25 MG PO TABS: 25.0000 mg | ORAL_TABLET | Freq: Two times a day (BID) | ORAL | 3 refills | Status: DC

## 2019-03-07 MED ORDER — AMLODIPINE BESYLATE 5 MG PO TABS: 5.0000 mg | ORAL_TABLET | Freq: Every day | ORAL | 3 refills | Status: DC

## 2019-03-07 NOTE — Progress Notes (Signed)
Cardiology Office Note:    Date:  03/07/2019   ID:  [name], DOB 1970-11-24, MRN [id number]  PCP:  [facility]  Cardiologist:  Jenne Campus, MD    Referring MD: [facility]   No chief complaint on file. Doing much better  History of Present Illness:    Jeffery Glenn is a 48 y.o. male with past medical history significant for cardiomyopathy echocardiogram in March of last year showed ejection fraction 25 to 30%, gradually we tried to put him on appropriate medical therapy looks like we can think there.  I will do EKG on him today if EKG is fine will double the dose of Coreg.  I will cut down his dose of amlodipine from 10 to 5 mg, today also will check his Chem-7.  Recently he was added Aldactone.  Overall he said he is doing well he does have some exertional shortness of breath but when asked him to compare himself today with himself 1 year ago he said he feels dramatically better.  He also would like to go back to work.  Again we will schedule echocardiogram to reassess left ventricle ejection fraction.  Denies have any palpitation dizziness or passing out  Past Medical History:  Diagnosis Date  . Congestive heart failure (CHF) ([place]) 05/06/2018  . Hypertension     Past Surgical History:  Procedure Laterality Date  . HAND SURGERY    . NASAL SEPTUM SURGERY      Current Medications: Current Meds  Medication Sig  . carvedilol (COREG) 12.5 MG tablet Take 1 tablet (12.5 mg total) by mouth 2 (two) times daily.  . fluticasone (FLONASE) 50 MCG/ACT nasal spray Place 1-2 sprays into both nostrils as needed.  . furosemide (LASIX) 40 MG tablet Take 40 mg by mouth daily.  . Loratadine 10 MG CAPS Take 10 mg by mouth daily.   . sacubitril-valsartan (ENTRESTO) 97-103 MG Take 1 tablet by mouth 2 (two) times daily.  Marland Kitchen spironolactone (ALDACTONE) 25 MG tablet Take 0.5 tablets (12.5 mg total) by mouth daily.     Allergies:   Shellfish allergy and  Peanut-containing drug products   Social History   Socioeconomic History  . Marital status: Married    Spouse name: Not on file  . Number of children: Not on file  . Years of education: Not on file  . Highest education level: Not on file  Occupational History  . Not on file  Tobacco Use  . Smoking status: Never Smoker  . Smokeless tobacco: Never Used  Substance and Sexual Activity  . Alcohol use: Yes  . Drug use: No  . Sexual activity: Not on file  Other Topics Concern  . Not on file  Social History Narrative  . Not on file   Social Determinants of Health   Financial Resource Strain:   . Difficulty of Paying Living Expenses: Not on file  Food Insecurity:   . Worried About Charity fundraiser in the Last Year: Not on file  . Ran Out of Food in the Last Year: Not on file  Transportation Needs:   . Lack of Transportation (Medical): Not on file  . Lack of Transportation (Non-Medical): Not on file  Physical Activity:   . Days of Exercise per Week: Not on file  . Minutes of Exercise per Session: Not on file  Stress:   . Feeling of Stress : Not on file  Social Connections:   . Frequency of  Communication with Friends and Family: Not on file  . Frequency of Social Gatherings with Friends and Family: Not on file  . Attends Religious Services: Not on file  . Active Member of Clubs or Organizations: Not on file  . Attends Banker Meetings: Not on file  . Marital Status: Not on file     Family History: The patient's family history includes Heart attack in his father and mother; Hypertension in his father and mother. ROS:   Please see the history of present illness.    All 14 point review of systems negative except as described per history of present illness  EKGs/Labs/Other Studies Reviewed:      Recent Labs: 05/06/2018: ALT 36; B Natriuretic Peptide 357.3; Hemoglobin 14.1; Platelets 166 10/06/2018: NT-Pro BNP 74 01/23/2019: BUN 9; Creatinine, Ser 0.83;  Potassium 4.1; Sodium 139  Recent Lipid Panel    Component Value Date/Time   CHOL 184 01/31/2008 2114   TRIG 62 01/31/2008 2114   HDL 52 01/31/2008 2114   CHOLHDL 3.5 Ratio 01/31/2008 2114   VLDL 12 01/31/2008 2114   LDLCALC 120 (H) 01/31/2008 2114    Physical Exam:    VS:  BP 124/76   Pulse 81   Ht 6' (1.829 m)   Wt 237 lb (107.5 kg)   SpO2 98%   BMI 32.14 kg/m     Wt Readings from Last 3 Encounters:  03/07/19 237 lb (107.5 kg)  01/23/19 238 lb 8 oz (108.2 kg)  11/24/18 238 lb (108 kg)     GEN:  Well nourished, well developed in no acute distress HEENT: Normal NECK: No JVD; No carotid bruits LYMPHATICS: No lymphadenopathy CARDIAC: RRR, no murmurs, no rubs, no gallops RESPIRATORY:  Clear to auscultation without rales, wheezing or rhonchi  ABDOMEN: Soft, non-tender, non-distended MUSCULOSKELETAL:  No edema; No deformity  SKIN: Warm and dry LOWER EXTREMITIES: no swelling NEUROLOGIC:  Alert and oriented x 3 PSYCHIATRIC:  Normal affect   ASSESSMENT:    1. Dilated cardiomyopathy (HCC)   2. Chronic combined systolic and diastolic congestive heart failure (HCC)   3. Essential hypertension   4. Dyspnea on exertion    PLAN:    In order of problems listed above:  1. Dilated cardiomyopathy on appropriate medical therapy which we will continue.  Plan is as outlined above. 2. Chronic combined systolic and diastolic congestive heart failure.  New York Heart Association 1/2.  Continue present management with remarks as above. 3. Essential hypertension finally his blood pressure is good.  Will double the dose of Coreg if EKG is normal. 4. Dyspnea on exertion significantly improved.  Related to cardiomyopathy.  Echocardiogram will be done.   Medication Adjustments/Labs and Tests Ordered: Current medicines are reviewed at length with the patient today.  Concerns regarding medicines are outlined above.  No orders of the defined types were placed in this  encounter.  Medication changes: No orders of the defined types were placed in this encounter.   Signed, Georgeanna Lea, MD, [facility] 03/07/2019 9:12 AM    [facility]

## 2019-03-07 NOTE — Patient Instructions (Addendum)
Medication Instructions:  1) DECREASE: Amlodipine to 5 mg once a day   2) INCREASE: Carvedilol to 25 mg twice a day   *If you need a refill on your cardiac medications before your next appointment, please call your pharmacy*  Lab Work: [facility]  If you have labs (blood work) drawn today and your tests are completely normal, you will receive your results only by: Marland Kitchen MyChart Message (if you have MyChart) OR . A paper copy in the mail If you have any lab test that is abnormal or we need to change your treatment, we will call you to review the results.  Testing/Procedures: Your physician has requested that you have an echocardiogram. Echocardiography is a painless test that uses sound waves to create images of your heart. It provides your doctor with information about the size and shape of your heart and how well your heart's chambers and valves are working. This procedure takes approximately one hour. There are no restrictions for this procedure.    Follow-Up: At [facility], you and your health needs are our priority.  As part of our continuing mission to provide you with exceptional heart care, we have created designated Provider Care Teams.  These Care Teams include your primary Cardiologist (physician) and Advanced Practice Providers (APPs -  Physician Assistants and Nurse Practitioners) who all work together to provide you with the care you need, when you need it.  Your next appointment:   3 month(s)  The format for your next appointment:   In Person  Provider:   Jenne Campus, MD  Other Instructions None

## 2019-03-08 LAB — BASIC METABOLIC PANEL
BUN/Creatinine Ratio: 13 (ref 9–20)
BUN: 12 mg/dL (ref 6–24)
CO2: 22 mmol/L (ref 20–29)
Calcium: 10 mg/dL (ref 8.7–10.2)
Chloride: 102 mmol/L (ref 96–106)
Creatinine, Ser: 0.9 mg/dL (ref 0.76–1.27)
GFR calc Af Amer: 118 mL/min/{1.73_m2} (ref >59)
GFR calc non Af Amer: 102 mL/min/{1.73_m2} (ref >59)
Glucose: 117 mg/dL — ABNORMAL HIGH (ref 65–99)
Potassium: 4.4 mmol/L (ref 3.5–5.2)
Sodium: 139 mmol/L (ref 134–144)

## 2019-03-09 ENCOUNTER — Ambulatory Visit (HOSPITAL_BASED_OUTPATIENT_CLINIC_OR_DEPARTMENT_OTHER): Admission: RE | Admit: 2019-03-09 | Payer: Medicaid Other | Source: Ambulatory Visit

## 2019-03-14 ENCOUNTER — Ambulatory Visit (HOSPITAL_BASED_OUTPATIENT_CLINIC_OR_DEPARTMENT_OTHER): Admission: RE | Admit: 2019-03-14 | Payer: Medicaid Other | Source: Ambulatory Visit

## 2019-03-15 ENCOUNTER — Ambulatory Visit (HOSPITAL_BASED_OUTPATIENT_CLINIC_OR_DEPARTMENT_OTHER)
Admission: RE | Admit: 2019-03-15 | Discharge: 2019-03-15 | Disposition: A | Discharge status: Home / Self Care | Payer: Medicaid Other | Source: Ambulatory Visit | Attending: Cardiology | Admitting: Cardiology

## 2019-03-15 ENCOUNTER — Other Ambulatory Visit: Payer: Self-pay

## 2019-03-15 DIAGNOSIS — I420 Dilated cardiomyopathy: Secondary | ICD-10-CM | POA: Diagnosis present

## 2019-03-15 NOTE — Progress Notes (Signed)
  Echocardiogram 2D Echocardiogram has been performed.  Jeffery Glenn 03/15/2019, 3:29 PM

## 2019-03-17 ENCOUNTER — Telehealth: Payer: Self-pay | Admitting: Emergency Medicine

## 2019-03-17 NOTE — Telephone Encounter (Signed)
Attempted to call patient to go over echocardiogram results. No answer and voicemail box full. Will continue efforts.

## 2019-03-27 ENCOUNTER — Other Ambulatory Visit: Payer: Self-pay | Admitting: Cardiology

## 2019-03-27 ENCOUNTER — Other Ambulatory Visit: Payer: Self-pay | Admitting: Family

## 2019-03-30 ENCOUNTER — Ambulatory Visit (INDEPENDENT_AMBULATORY_CARE_PROVIDER_SITE_OTHER): Payer: Medicaid Other | Admitting: Cardiology

## 2019-03-30 ENCOUNTER — Encounter: Payer: Self-pay | Admitting: Cardiology

## 2019-03-30 ENCOUNTER — Other Ambulatory Visit: Payer: Self-pay

## 2019-03-30 VITALS — BP 124/98 | HR 83 | Ht 72.0 in | Wt 237.0 lb

## 2019-03-30 DIAGNOSIS — I420 Dilated cardiomyopathy: Secondary | ICD-10-CM | POA: Diagnosis not present

## 2019-03-30 DIAGNOSIS — R0600 Dyspnea, unspecified: Secondary | ICD-10-CM | POA: Diagnosis not present

## 2019-03-30 DIAGNOSIS — I10 Essential (primary) hypertension: Secondary | ICD-10-CM

## 2019-03-30 DIAGNOSIS — I5042 Chronic combined systolic (congestive) and diastolic (congestive) heart failure: Secondary | ICD-10-CM | POA: Diagnosis not present

## 2019-03-30 DIAGNOSIS — R0609 Other forms of dyspnea: Secondary | ICD-10-CM

## 2019-03-30 NOTE — Progress Notes (Signed)
Cardiology Office Note:    Date:  03/30/2019   ID:  [name], DOB 1970/09/23, MRN [id number]  PCP:  [facility]  Cardiologist:  Gypsy Balsam, MD    Referring MD: [facility]   Chief Complaint  Patient presents with  . Follow-up    History of Present Illness:    Jeffery Glenn is a 49 y.o. male with history of cardiomyopathy with ejection fraction 25 to 30% in February 2020, gradual intractable treatment with right medication there was some difficulty but now we get it at the proper dosages.  Echocardiogram repeated showed mild improvement left ventricle ejection fraction.  Now he got ejection fraction 30 to 35%.  Overall clinically he is doing well denies have any chest pain tightness squeezing pressure burning chest.  Sometimes does have fatigue but otherwise doing well.  Denies having a chest pain.  Past Medical History:  Diagnosis Date  . Congestive heart failure (CHF) (HCC) 05/06/2018  . Hypertension     Past Surgical History:  Procedure Laterality Date  . HAND SURGERY    . NASAL SEPTUM SURGERY      Current Medications: Current Meds  Medication Sig  . amLODipine (NORVASC) 5 MG tablet Take 1 tablet (5 mg total) by mouth daily.  Marland Kitchen atorvastatin (LIPITOR) 20 MG tablet Take 20 mg by mouth daily.  . carvedilol (COREG) 25 MG tablet Take 1 tablet (25 mg total) by mouth 2 (two) times daily.  Marland Kitchen ENTRESTO 97-103 MG TAKE 1 TABLET BY MOUTH TWICE DAILY  . fluticasone (FLONASE) 50 MCG/ACT nasal spray Place 1-2 sprays into both nostrils as needed.  . furosemide (LASIX) 40 MG tablet Take 40 mg by mouth daily.  . Loratadine 10 MG CAPS Take 10 mg by mouth daily.   Marland Kitchen spironolactone (ALDACTONE) 25 MG tablet Take 0.5 tablets (12.5 mg total) by mouth daily.     Allergies:   Shellfish allergy and Peanut-containing drug products   Social History   Socioeconomic History  . Marital status: Married    Spouse name: Not on file  . Number of  children: Not on file  . Years of education: Not on file  . Highest education level: Not on file  Occupational History  . Not on file  Tobacco Use  . Smoking status: Never Smoker  . Smokeless tobacco: Never Used  Substance and Sexual Activity  . Alcohol use: Yes  . Drug use: No  . Sexual activity: Not on file  Other Topics Concern  . Not on file  Social History Narrative  . Not on file   Social Determinants of Health   Financial Resource Strain:   . Difficulty of Paying Living Expenses: Not on file  Food Insecurity:   . Worried About Programme researcher, broadcasting/film/video in the Last Year: Not on file  . Ran Out of Food in the Last Year: Not on file  Transportation Needs:   . Lack of Transportation (Medical): Not on file  . Lack of Transportation (Non-Medical): Not on file  Physical Activity:   . Days of Exercise per Week: Not on file  . Minutes of Exercise per Session: Not on file  Stress:   . Feeling of Stress : Not on file  Social Connections:   . Frequency of Communication with Friends and Family: Not on file  . Frequency of Social Gatherings with Friends and Family: Not on file  . Attends Religious Services: Not on file  . Active Member  of Clubs or Organizations: Not on file  . Attends Archivist Meetings: Not on file  . Marital Status: Not on file     Family History: The patient's family history includes Heart attack in his father and mother; Hypertension in his father and mother. ROS:   Please see the history of present illness.    All 14 point review of systems negative except as described per history of present illness  EKGs/Labs/Other Studies Reviewed:      Recent Labs: 05/06/2018: ALT 36; B Natriuretic Peptide 357.3; Hemoglobin 14.1; Platelets 166 10/06/2018: NT-Pro BNP 74 03/07/2019: BUN 12; Creatinine, Ser 0.90; Potassium 4.4; Sodium 139  Recent Lipid Panel    Component Value Date/Time   CHOL 184 01/31/2008 2114   TRIG 62 01/31/2008 2114   HDL 52  01/31/2008 2114   CHOLHDL 3.5 Ratio 01/31/2008 2114   VLDL 12 01/31/2008 2114   LDLCALC 120 (H) 01/31/2008 2114    Physical Exam:    VS:  BP (!) 124/98 (BP Location: Left Arm, Patient Position: Sitting, Cuff Size: Normal)   Pulse 83   Ht 6' (1.829 m)   Wt 237 lb (107.5 kg)   SpO2 98%   BMI 32.14 kg/m     Wt Readings from Last 3 Encounters:  03/30/19 237 lb (107.5 kg)  03/07/19 237 lb (107.5 kg)  01/23/19 238 lb 8 oz (108.2 kg)     GEN:  Well nourished, well developed in no acute distress HEENT: Normal NECK: No JVD; No carotid bruits LYMPHATICS: No lymphadenopathy CARDIAC: RRR, no murmurs, no rubs, no gallops RESPIRATORY:  Clear to auscultation without rales, wheezing or rhonchi  ABDOMEN: Soft, non-tender, non-distended MUSCULOSKELETAL:  No edema; No deformity  SKIN: Warm and dry LOWER EXTREMITIES: no swelling NEUROLOGIC:  Alert and oriented x 3 PSYCHIATRIC:  Normal affect   ASSESSMENT:    1. Dilated cardiomyopathy ([place])   2. Essential hypertension   3. Chronic combined systolic and diastolic congestive heart failure ([place])   4. Dyspnea on exertion    PLAN:    In order of problems listed above:  1. Dilated cardiomyopathy only mild improvement with medications.  I will schedule him to have CT coronary angio to rule out significant coronary artery disease.  He does not have any symptoms suggesting that but that need to be checked.  Obviously, if his CT angios abnormal will proceed with cardiac catheterization.  If not will be referred to EP team for consideration of defibrillator.  His QRS complex is fairly narrow therefore I do not see a role for CRT. 2. Essential hypertension: Finally will have blood pressure reasonably controlled we will continue present management. 3. Chronic systolic congestive heart failure New York Heart Association 1/2, continue present management. 4. Dyspnea on exertion denies having  5.    Medication Adjustments/Labs and Tests Ordered:  Current medicines are reviewed at length with the patient today.  Concerns regarding medicines are outlined above.  No orders of the defined types were placed in this encounter.  Medication changes: No orders of the defined types were placed in this encounter.   Signed, Park Liter, MD, [facility] 03/30/2019 1:25 PM    [facility]

## 2019-03-30 NOTE — Patient Instructions (Signed)
Medication Instructions:  No Changes *If you need a refill on your cardiac medications before your next appointment, please call your pharmacy*  Lab Work: none If you have labs (blood work) drawn today and your tests are completely normal, you will receive your results only by: Marland Kitchen MyChart Message (if you have MyChart) OR . A paper copy in the mail If you have any lab test that is abnormal or we need to change your treatment, we will call you to review the results.  Testing/Procedures: CT Angio  Non-Cardiac CT Angiography (CTA), is a special type of CT scan that uses a computer to produce multi-dimensional views of major blood vessels throughout the body. In CT angiography, a contrast material is injected through an IV to help visualize the blood vessels   Follow-Up: At [facility], you and your health needs are our priority.  As part of our continuing mission to provide you with exceptional heart care, we have created designated Provider Care Teams.  These Care Teams include your primary Cardiologist (physician) and Advanced Practice Providers (APPs -  Physician Assistants and Nurse Practitioners) who all work together to provide you with the care you need, when you need it.  Your next appointment:   6 week(s)  The format for your next appointment:   Either In Person or Virtual  Provider:   Gypsy Balsam, MD  Other Instructions   Stay well

## 2019-05-05 ENCOUNTER — Telehealth: Payer: Self-pay | Admitting: Emergency Medicine

## 2019-05-05 DIAGNOSIS — I420 Dilated cardiomyopathy: Secondary | ICD-10-CM

## 2019-05-05 DIAGNOSIS — I5042 Chronic combined systolic (congestive) and diastolic (congestive) heart failure: Secondary | ICD-10-CM

## 2019-05-05 NOTE — Telephone Encounter (Signed)
Please see mychart messages. Patient needs CT and bmp must be drawn before to check kidney status. Orders placed.

## 2019-05-09 LAB — BASIC METABOLIC PANEL
BUN/Creatinine Ratio: 10 (ref 9–20)
BUN: 9 mg/dL (ref 6–24)
CO2: 23 mmol/L (ref 20–29)
Calcium: 9.1 mg/dL (ref 8.7–10.2)
Chloride: 105 mmol/L (ref 96–106)
Creatinine, Ser: 0.9 mg/dL (ref 0.76–1.27)
GFR calc Af Amer: 117 mL/min/{1.73_m2} (ref >59)
GFR calc non Af Amer: 101 mL/min/{1.73_m2} (ref >59)
Glucose: 109 mg/dL — ABNORMAL HIGH (ref 65–99)
Potassium: 4.2 mmol/L (ref 3.5–5.2)
Sodium: 142 mmol/L (ref 134–144)

## 2019-05-10 ENCOUNTER — Encounter: Payer: Self-pay | Admitting: *Deleted

## 2019-05-11 ENCOUNTER — Telehealth: Payer: Medicaid Other | Admitting: Cardiology

## 2019-05-12 NOTE — Addendum Note (Signed)
Addended by: Lita Mains on: 05/12/2019 04:50 PM   Modules accepted: Orders

## 2019-05-12 NOTE — Telephone Encounter (Signed)
Patient informed that his CT for next Tuesday was cancelled and we would be in touch to give appointment and instructions about cardiac ct. He verbally understood no further questions.

## 2019-05-12 NOTE — Telephone Encounter (Addendum)
Attempted to call patient and inform him that he needs a cardiac ct and not a ct angio. No answer and voicemail was full will continue efforts. I have let schedulers know that it needs to be canceled. Will continue to attempt to reach him.

## 2019-05-15 ENCOUNTER — Telehealth: Payer: Medicaid Other | Admitting: Cardiology

## 2019-05-16 ENCOUNTER — Other Ambulatory Visit (HOSPITAL_BASED_OUTPATIENT_CLINIC_OR_DEPARTMENT_OTHER): Payer: Medicaid Other

## 2019-05-17 NOTE — Telephone Encounter (Signed)
Called spoke to patients wife per dpr informed her of the information below and she will share with patient.    Your cardiac CT will be scheduled at one of the below locations:   [facility] [address] [place], [place] [address] [phone]  OR  [facility] [address] [place], [place] [address] [phone]  If scheduled at [facility], please arrive at the [facility] main entrance of [facility] 30 minutes prior to test start time. Proceed to the [facility] Radiology Department (first floor) to check-in and test prep.  If scheduled at [facility], please arrive 15 mins early for check-in and test prep.  Please follow these instructions carefully (unless otherwise directed):  Hold all erectile dysfunction medications at least 3 days (72 hrs) prior to test.  On the Night Before the Test: . Be sure to Drink plenty of water. . Do not consume any caffeinated/decaffeinated beverages or chocolate 12 hours prior to your test. . Do not take any antihistamines 12 hours prior to your test.  On the Day of the Test: . Drink plenty of water. Do not drink any water within one hour of the test. . Do not eat any food 4 hours prior to the test. . You may take your regular medications prior to the test.  . Take carvedilol two hours prior to test. . HOLD Furosemide morning of the test.          After the Test: . Drink plenty of water. . After receiving IV contrast, you may experience a mild flushed feeling. This is normal. . On occasion, you may experience a mild rash up to 24 hours after the test. This is not dangerous. If this occurs, you can take Benadryl 25 mg and increase your fluid intake. . If you experience trouble breathing, this can be serious. If it is severe call 911 IMMEDIATELY. If it is mild, please call our office. . If you take any of these medications:  Glipizide/Metformin, Avandament, Glucavance, please do not take 48 hours after completing test unless otherwise instructed.   Once we have confirmed authorization from your insurance company, we will call you to set up a date and time for your test.   For non-scheduling related questions, please contact the cardiac imaging nurse navigator should you have any questions/concerns: Rockwell Alexandria, RN Navigator Cardiac Imaging [facility] [phone] office

## 2019-05-31 ENCOUNTER — Ambulatory Visit: Payer: Medicaid Other | Admitting: Cardiology

## 2019-06-06 ENCOUNTER — Emergency Department (HOSPITAL_BASED_OUTPATIENT_CLINIC_OR_DEPARTMENT_OTHER): Payer: Medicaid Other

## 2019-06-06 ENCOUNTER — Encounter (HOSPITAL_BASED_OUTPATIENT_CLINIC_OR_DEPARTMENT_OTHER): Payer: Self-pay | Admitting: Emergency Medicine

## 2019-06-06 ENCOUNTER — Emergency Department (HOSPITAL_BASED_OUTPATIENT_CLINIC_OR_DEPARTMENT_OTHER)
Admission: EM | Admit: 2019-06-06 | Discharge: 2019-06-06 | Disposition: A | Discharge status: Home / Self Care | Payer: Medicaid Other | Attending: Emergency Medicine | Admitting: Emergency Medicine

## 2019-06-06 ENCOUNTER — Other Ambulatory Visit: Payer: Self-pay

## 2019-06-06 DIAGNOSIS — R519 Headache, unspecified: Secondary | ICD-10-CM | POA: Diagnosis not present

## 2019-06-06 DIAGNOSIS — R110 Nausea: Secondary | ICD-10-CM | POA: Diagnosis not present

## 2019-06-06 DIAGNOSIS — Z79899 Other long term (current) drug therapy: Secondary | ICD-10-CM | POA: Insufficient documentation

## 2019-06-06 DIAGNOSIS — I509 Heart failure, unspecified: Secondary | ICD-10-CM | POA: Diagnosis not present

## 2019-06-06 DIAGNOSIS — H53149 Visual discomfort, unspecified: Secondary | ICD-10-CM | POA: Diagnosis not present

## 2019-06-06 DIAGNOSIS — I110 Hypertensive heart disease with heart failure: Secondary | ICD-10-CM | POA: Diagnosis not present

## 2019-06-06 LAB — BASIC METABOLIC PANEL
Anion gap: 7 (ref 5–15)
BUN: 10 mg/dL (ref 6–20)
CO2: 27 mmol/L (ref 22–32)
Calcium: 8.8 mg/dL — ABNORMAL LOW (ref 8.9–10.3)
Chloride: 102 mmol/L (ref 98–111)
Creatinine, Ser: 0.87 mg/dL (ref 0.61–1.24)
GFR calc Af Amer: >60 mL/min (ref >60)
GFR calc non Af Amer: >60 mL/min (ref >60)
Glucose, Bld: 119 mg/dL — ABNORMAL HIGH (ref 70–99)
Potassium: 3.5 mmol/L (ref 3.5–5.1)
Sodium: 136 mmol/L (ref 135–145)

## 2019-06-06 LAB — CBC WITH DIFFERENTIAL/PLATELET
Abs Immature Granulocytes: 0 10*3/uL (ref 0.00–0.07)
Basophils Absolute: 0 10*3/uL (ref 0.0–0.1)
Basophils Relative: 1 %
Eosinophils Absolute: 0.2 10*3/uL (ref 0.0–0.5)
Eosinophils Relative: 5 %
HCT: 44 % (ref 39.0–52.0)
Hemoglobin: 14.3 g/dL (ref 13.0–17.0)
Immature Granulocytes: 0 %
Lymphocytes Relative: 60 %
Lymphs Abs: 2 10*3/uL (ref 0.7–4.0)
MCH: 27.6 pg (ref 26.0–34.0)
MCHC: 32.5 g/dL (ref 30.0–36.0)
MCV: 84.9 fL (ref 80.0–100.0)
Monocytes Absolute: 0.2 10*3/uL (ref 0.1–1.0)
Monocytes Relative: 6 %
Neutro Abs: 0.9 10*3/uL — ABNORMAL LOW (ref 1.7–7.7)
Neutrophils Relative %: 28 %
Platelets: 193 10*3/uL (ref 150–400)
RBC: 5.18 MIL/uL (ref 4.22–5.81)
RDW: 15.9 % — ABNORMAL HIGH (ref 11.5–15.5)
Smear Review: NORMAL
WBC Morphology: ABNORMAL
WBC: 3.3 10*3/uL — ABNORMAL LOW (ref 4.0–10.5)
nRBC: 0 % (ref 0.0–0.2)

## 2019-06-06 MED ORDER — IOHEXOL 350 MG/ML SOLN
100.0000 mL | Freq: Once | INTRAVENOUS | Status: AC
  Administered 2019-06-06: 100 mL via INTRAVENOUS

## 2019-06-06 MED ORDER — KETOROLAC TROMETHAMINE 30 MG/ML IJ SOLN
30.0000 mg | Freq: Once | INTRAMUSCULAR | Status: AC
  Administered 2019-06-06: 19:00:00 30 mg via INTRAVENOUS
  Filled 2019-06-06: qty 1

## 2019-06-06 NOTE — ED Provider Notes (Signed)
[facility] EMERGENCY DEPARTMENT Provider Note   CSN: [id number] Arrival date & time: 06/06/19  1734     History Chief Complaint  Patient presents with  . Headache    Jeffery Glenn is a 49 y.o. male.  Pt presents with immediate onset HA that began yesterday evening after he came home from work. He states that he normally gets headaches, but this is the worst one he has had. He rates the pain as an 8/10 bilaterally frontal HA. He denies pulsating nature. He admits to some nausea, eye pressure, and photophobia when the HA began. He denies these symptoms now. He states that he took 2 Tylenol and went to bed last night and when he woke up his HA has not resolved. He denies any history of migraines. He states that he does get these headaches when he has a sinus infection or when his blood pressure is high.  He sees a cardiologist for his BP, but has not been able to do so recently. He does not check his BP regularly. He has been taking his BP medications normally. He denies any chest pain, SOB, abdominal pain, back pain, previous head injury.   Headache Associated symptoms: nausea   Associated symptoms: no back pain, no congestion, no cough, no dizziness, no ear pain, no eye pain, no fatigue, no fever, no neck pain, no neck stiffness, no numbness, no seizures, no sinus pressure, no sore throat and no vomiting        Past Medical History:  Diagnosis Date  . Atypical chest pain 05/17/2018  . Cardiomyopathy (HCC) ejection fraction 25 to 30% in February 2020, improvement to 30 to 35% in January 2021 06/09/2018   Ejection fraction 25 to 30% on echocardiogram from February 2020  . Congestive heart failure (CHF) (HCC) 05/06/2018  . Dyspnea on exertion 05/17/2018  . Essential hypertension 05/17/2018  . Hypertension     Patient Active Problem List   Diagnosis Date Noted  . Cardiomyopathy (HCC) ejection fraction 25 to 30% in February 2020, improvement to 30 to 35% in January 2021  06/09/2018  . Dyspnea on exertion 05/17/2018  . Atypical chest pain 05/17/2018  . Essential hypertension 05/17/2018  . Congestive heart failure (CHF) (HCC) 05/06/2018    Past Surgical History:  Procedure Laterality Date  . HAND SURGERY    . NASAL SEPTUM SURGERY         Family History  Problem Relation Age of Onset  . Heart attack Mother   . Hypertension Mother   . Heart attack Father   . Hypertension Father     Social History   Tobacco Use  . Smoking status: Never Smoker  . Smokeless tobacco: Never Used  Substance Use Topics  . Alcohol use: Yes  . Drug use: No    Home Medications Prior to Admission medications   Medication Sig Start Date End Date Taking? Authorizing Provider  amLODipine (NORVASC) 5 MG tablet Take 1 tablet (5 mg total) by mouth daily. 03/07/19   Georgeanna Lea, MD  atorvastatin (LIPITOR) 20 MG tablet Take 20 mg by mouth daily.    [provider]  carvedilol (COREG) 25 MG tablet Take 1 tablet (25 mg total) by mouth 2 (two) times daily. 03/07/19   Georgeanna Lea, MD  ENTRESTO 97-103 MG TAKE 1 TABLET BY MOUTH TWICE DAILY 03/28/19   Georgeanna Lea, MD  fluticasone [facility]) 50 MCG/ACT nasal spray Place 1-2 sprays into both nostrils as needed. 11/09/18   [provider]  furosemide (LASIX) 40 MG tablet Take 40 mg by mouth daily.    [provider]  Loratadine 10 MG CAPS Take 10 mg by mouth daily.     [provider]  spironolactone (ALDACTONE) 25 MG tablet Take 0.5 tablets (12.5 mg total) by mouth daily. 01/23/19   Loel Dubonnet, NP    Allergies    Shellfish allergy and Peanut-containing drug products  Review of Systems   Review of Systems  Constitutional: Negative for chills, diaphoresis, fatigue and fever.  HENT: Negative for congestion, ear pain, rhinorrhea, sinus pressure, sinus pain, sneezing, sore throat and trouble swallowing.   Eyes: Negative for pain and redness.  Respiratory: Negative for  cough, shortness of breath and wheezing.   Cardiovascular: Negative for chest pain, palpitations and leg swelling.  Gastrointestinal: Positive for nausea. Negative for constipation and vomiting.  Musculoskeletal: Negative for back pain, neck pain and neck stiffness.  Neurological: Positive for headaches. Negative for dizziness, seizures, syncope, speech difficulty and numbness.  Psychiatric/Behavioral: Negative for confusion.  All other systems reviewed and are negative.   Physical Exam Updated Vital Signs BP (!) 157/97 (BP Location: Left Arm)   Pulse 80   Temp 97.8 F (36.6 C) (Oral)   Resp 16   Ht 6' (1.829 m)   Wt 102.1 kg   SpO2 99%   BMI 30.52 kg/m   Physical Exam Constitutional:      Appearance: He is well-developed. He is not ill-appearing.  HENT:     Head: Normocephalic and atraumatic.     Mouth/Throat:     Pharynx: Oropharynx is clear.  Eyes:     Extraocular Movements: Extraocular movements intact.     Pupils: Pupils are equal, round, and reactive to light.  Cardiovascular:     Rate and Rhythm: Normal rate and regular rhythm.  Pulmonary:     Effort: Pulmonary effort is normal.     Breath sounds: Normal breath sounds.  Abdominal:     General: Bowel sounds are normal.  Musculoskeletal:        General: No swelling. Normal range of motion.     Cervical back: Normal range of motion and neck supple.  Neurological:     Mental Status: He is alert.     Cranial Nerves: No cranial nerve deficit.  Psychiatric:        Mood and Affect: Mood normal.        Speech: Speech normal.        Behavior: Behavior normal.     ED Results / Procedures / Treatments   Labs (all labs ordered are listed, but only abnormal results are displayed) Labs Reviewed  BASIC METABOLIC PANEL - Abnormal; Notable for the following components:      Result Value   Glucose, Bld 119 (*)    Calcium 8.8 (*)    All other components within normal limits  CBC WITH DIFFERENTIAL/PLATELET - Abnormal;  Notable for the following components:   WBC 3.3 (*)    RDW 15.9 (*)    Neutro Abs 0.9 (*)    All other components within normal limits  PATHOLOGIST SMEAR REVIEW    EKG None  Radiology CT Angio Head W or Wo Contrast  Result Date: 06/06/2019 CLINICAL DATA:  Headache for 2 days. History of hypertension. EXAM: CT ANGIOGRAPHY HEAD AND NECK TECHNIQUE: Multidetector CT imaging of the head and neck was performed using the standard protocol during bolus administration of intravenous contrast. Multiplanar CT image reconstructions and MIPs  were obtained to evaluate the vascular anatomy. Carotid stenosis measurements (when applicable) are obtained utilizing NASCET criteria, using the distal internal carotid diameter as the denominator. CONTRAST:  OMNIPAQUE IOHEXOL 350 MG/ML SOLN COMPARISON:  None. FINDINGS: CT HEAD FINDINGS Brain: There is no mass, hemorrhage or extra-axial collection. The size and configuration of the ventricles and extra-axial CSF spaces are normal. There is no acute or chronic infarction. The brain parenchyma is normal. Skull: The visualized skull base, calvarium and extracranial soft tissues are normal. Sinuses/Orbits: No fluid levels or advanced mucosal thickening of the visualized paranasal sinuses. No mastoid or middle ear effusion. The orbits are normal. CTA NECK FINDINGS SKELETON: There is no bony spinal canal stenosis. No lytic or blastic lesion. OTHER NECK: Normal pharynx, larynx and major salivary glands. No cervical lymphadenopathy. Unremarkable thyroid gland. UPPER CHEST: No pneumothorax or pleural effusion. No nodules or masses. AORTIC ARCH: There is no calcific atherosclerosis of the aortic arch. There is no aneurysm, dissection or hemodynamically significant stenosis of the visualized portion of the aorta. Conventional 3 vessel aortic branching pattern. The visualized proximal subclavian arteries are widely patent. RIGHT CAROTID SYSTEM: Normal without aneurysm, dissection or  stenosis. LEFT CAROTID SYSTEM: Normal without aneurysm, dissection or stenosis. VERTEBRAL ARTERIES: Left dominant configuration. Both origins are clearly patent. There is no dissection, occlusion or flow-limiting stenosis to the skull base (V1-V3 segments). CTA HEAD FINDINGS POSTERIOR CIRCULATION: --Vertebral arteries: Normal V4 segments. --Posterior inferior cerebellar arteries (PICA): Patent origins from the vertebral arteries. --Anterior inferior cerebellar arteries (AICA): Patent origins from the basilar artery. --Basilar artery: Normal. --Superior cerebellar arteries: Normal. --Posterior cerebral arteries: Normal. Both originate from the basilar artery. Posterior communicating arteries (p-comm) are diminutive or absent. ANTERIOR CIRCULATION: --Intracranial internal carotid arteries: Normal. --Anterior cerebral arteries (ACA): Normal. Both A1 segments are present. Patent anterior communicating artery (a-comm). --Middle cerebral arteries (MCA): Normal. VENOUS SINUSES: As permitted by contrast timing, patent. ANATOMIC VARIANTS: None Review of the MIP images confirms the above findings. IMPRESSION: Normal CTA of the head and neck. Electronically Signed   By: Deatra Robinson M.D.   On: 06/06/2019 19:47   CT Angio Neck W and/or Wo Contrast  Result Date: 06/06/2019 CLINICAL DATA:  Headache for 2 days. History of hypertension. EXAM: CT ANGIOGRAPHY HEAD AND NECK TECHNIQUE: Multidetector CT imaging of the head and neck was performed using the standard protocol during bolus administration of intravenous contrast. Multiplanar CT image reconstructions and MIPs were obtained to evaluate the vascular anatomy. Carotid stenosis measurements (when applicable) are obtained utilizing NASCET criteria, using the distal internal carotid diameter as the denominator. CONTRAST:  OMNIPAQUE IOHEXOL 350 MG/ML SOLN COMPARISON:  None. FINDINGS: CT HEAD FINDINGS Brain: There is no mass, hemorrhage or extra-axial collection. The size  and configuration of the ventricles and extra-axial CSF spaces are normal. There is no acute or chronic infarction. The brain parenchyma is normal. Skull: The visualized skull base, calvarium and extracranial soft tissues are normal. Sinuses/Orbits: No fluid levels or advanced mucosal thickening of the visualized paranasal sinuses. No mastoid or middle ear effusion. The orbits are normal. CTA NECK FINDINGS SKELETON: There is no bony spinal canal stenosis. No lytic or blastic lesion. OTHER NECK: Normal pharynx, larynx and major salivary glands. No cervical lymphadenopathy. Unremarkable thyroid gland. UPPER CHEST: No pneumothorax or pleural effusion. No nodules or masses. AORTIC ARCH: There is no calcific atherosclerosis of the aortic arch. There is no aneurysm, dissection or hemodynamically significant stenosis of the visualized portion of the aorta. Conventional 3  vessel aortic branching pattern. The visualized proximal subclavian arteries are widely patent. RIGHT CAROTID SYSTEM: Normal without aneurysm, dissection or stenosis. LEFT CAROTID SYSTEM: Normal without aneurysm, dissection or stenosis. VERTEBRAL ARTERIES: Left dominant configuration. Both origins are clearly patent. There is no dissection, occlusion or flow-limiting stenosis to the skull base (V1-V3 segments). CTA HEAD FINDINGS POSTERIOR CIRCULATION: --Vertebral arteries: Normal V4 segments. --Posterior inferior cerebellar arteries (PICA): Patent origins from the vertebral arteries. --Anterior inferior cerebellar arteries (AICA): Patent origins from the basilar artery. --Basilar artery: Normal. --Superior cerebellar arteries: Normal. --Posterior cerebral arteries: Normal. Both originate from the basilar artery. Posterior communicating arteries (p-comm) are diminutive or absent. ANTERIOR CIRCULATION: --Intracranial internal carotid arteries: Normal. --Anterior cerebral arteries (ACA): Normal. Both A1 segments are present. Patent anterior communicating  artery (a-comm). --Middle cerebral arteries (MCA): Normal. VENOUS SINUSES: As permitted by contrast timing, patent. ANATOMIC VARIANTS: None Review of the MIP images confirms the above findings. IMPRESSION: Normal CTA of the head and neck. Electronically Signed   By: Deatra Robinson M.D.   On: 06/06/2019 19:47    Procedures Procedures (including critical care time)  Medications Ordered in ED Medications  ketorolac (TORADOL) 30 MG/ML injection 30 mg (30 mg Intravenous Given 06/06/19 1830)  iohexol (OMNIPAQUE) 350 MG/ML injection 100 mL (100 mLs Intravenous Contrast Given 06/06/19 1908)    ED Course  I have reviewed the triage vital signs and the nursing notes.  Pertinent labs & imaging results that were available during my care of the patient were reviewed by me and considered in my medical decision making (see chart for details).    MDM Rules/Calculators/A&P                      Pt is a 49 year old with immediate onset HA (8/10). HA is typical for him, but this one is the worst one he has had. He admits to nausea, photophobia, and eye pressure that has resolved. Normal PE and no focal neuro deficits. No chest pain, abdominal pain, SOB. CT angio to r/o SAH due to immediate onset of HA and pt's PMH. Low suspicion for [facility]. No concerns for sinus infection. No signs of meningitis. Pt given Ketoralac for pain.   ..8:31 PM Normal CT angio. Discussed results with pt. Gave pt HA return precautions, pt expressed understanding. Pt with normal gait, normal neuro exam, and HA is now a 6/10. Pt will f/u with neurology this week as well as PCP for his BP. Educated pt on HA diary and BP journal.    Final Clinical Impression(s) / ED Diagnoses Final diagnoses:  Acute nonintractable headache, unspecified headache type    Rx / DC Orders ED Discharge Orders         Ordered    Ambulatory referral to Neurology    Comments: An appointment is requested in approximately: 1 week for headache   06/06/19 2003             Farrel Gordon, Cordelia Poche 06/06/19 2032    Maia Plan, MD 06/07/19 2303

## 2019-06-06 NOTE — Discharge Instructions (Signed)

## 2019-06-06 NOTE — ED Triage Notes (Signed)
Pt c/o HA x 2 days. PT also reports sinus congestion. Pt denies fever. Ambulatory, normal gait. Pt endorses 1 g tylenol at 1630.

## 2019-06-07 LAB — PATHOLOGIST SMEAR REVIEW: Path Review: REACTIVE

## 2019-06-13 ENCOUNTER — Ambulatory Visit: Payer: Medicaid Other | Admitting: Neurology

## 2019-06-13 ENCOUNTER — Telehealth: Payer: Self-pay

## 2019-06-13 NOTE — Telephone Encounter (Signed)
Pt did not show for their appt with Dr. Athar today.  

## 2019-06-14 ENCOUNTER — Encounter (HOSPITAL_COMMUNITY): Payer: Self-pay

## 2019-06-14 ENCOUNTER — Telehealth (HOSPITAL_COMMUNITY): Payer: Self-pay | Admitting: Emergency Medicine

## 2019-06-14 NOTE — Telephone Encounter (Signed)
VM box not set up, cannot leave message 

## 2019-06-15 ENCOUNTER — Ambulatory Visit (HOSPITAL_COMMUNITY)
Admission: RE | Admit: 2019-06-15 | Discharge: 2019-06-15 | Disposition: A | Discharge status: Home / Self Care | Payer: Medicaid Other | Source: Ambulatory Visit | Attending: Cardiology | Admitting: Cardiology

## 2019-06-15 ENCOUNTER — Other Ambulatory Visit: Payer: Self-pay

## 2019-06-15 ENCOUNTER — Encounter: Payer: Medicaid Other | Admitting: *Deleted

## 2019-06-15 DIAGNOSIS — I420 Dilated cardiomyopathy: Secondary | ICD-10-CM | POA: Insufficient documentation

## 2019-06-15 DIAGNOSIS — I5042 Chronic combined systolic (congestive) and diastolic (congestive) heart failure: Secondary | ICD-10-CM | POA: Diagnosis not present

## 2019-06-15 DIAGNOSIS — Z006 Encounter for examination for normal comparison and control in clinical research program: Secondary | ICD-10-CM

## 2019-06-15 DIAGNOSIS — I509 Heart failure, unspecified: Secondary | ICD-10-CM

## 2019-06-15 MED ORDER — IOHEXOL 350 MG/ML SOLN
80.0000 mL | Freq: Once | INTRAVENOUS | Status: AC | PRN
  Administered 2019-06-15: 10:00:00 80 mL via INTRAVENOUS

## 2019-06-15 MED ORDER — NITROGLYCERIN 0.4 MG SL SUBL
SUBLINGUAL_TABLET | SUBLINGUAL | Status: AC
  Filled 2019-06-15: qty 2

## 2019-06-15 MED ORDER — NITROGLYCERIN 0.4 MG SL SUBL
0.8000 mg | SUBLINGUAL_TABLET | Freq: Once | SUBLINGUAL | Status: AC
  Administered 2019-06-15: 0.8 mg via SUBLINGUAL

## 2019-06-15 NOTE — Research (Signed)
CADFEM Informed Consent                  Subject Name:   Jeffery Glenn   Subject met inclusion and exclusion criteria.  The informed consent form, study requirements and expectations were reviewed with the subject and questions and concerns were addressed prior to the signing of the consent form.  The subject verbalized understanding of the trial requirements.  The subject agreed to participate in the CADFEM trial and signed the informed consent.  The informed consent was obtained prior to performance of any protocol-specific procedures for the subject.  A copy of the signed informed consent was given to the subject and a copy was placed in the subject's medical record.   [place] Lurlean Kernen, Research Assistant  06/15/2019 08:58 a.m.

## 2019-06-16 ENCOUNTER — Telehealth: Payer: Self-pay

## 2019-06-16 NOTE — Telephone Encounter (Signed)
-----   Message from Georgeanna Lea, MD sent at 06/15/2019  9:50 PM EDT ----- CT of his heart showed calcium score 0, no evidence of coronary artery disease

## 2019-06-16 NOTE — Telephone Encounter (Signed)
Tried calling patient. No answer and no voicemail set up for me to leave a message. 

## 2019-06-19 NOTE — Telephone Encounter (Signed)
Tried calling patient. No answer and no voicemail set up for me to leave a message. 

## 2019-06-20 NOTE — Telephone Encounter (Signed)
Tried calling patient. No answer and no voicemail set up for me to leave a message.   I am going to send a letter to the patient at this time as I have not been able to reach him x3.

## 2019-06-21 ENCOUNTER — Telehealth: Payer: Self-pay | Admitting: Cardiology

## 2019-06-21 NOTE — Telephone Encounter (Signed)
Patient informed of results. Scheduled him for a follow up appointment as well.

## 2019-06-21 NOTE — Telephone Encounter (Signed)
Patient returning Hayley's call in regards to CT results.

## 2019-06-28 ENCOUNTER — Other Ambulatory Visit: Payer: Self-pay

## 2019-06-28 ENCOUNTER — Ambulatory Visit (INDEPENDENT_AMBULATORY_CARE_PROVIDER_SITE_OTHER): Payer: Medicaid Other | Admitting: Cardiology

## 2019-06-28 ENCOUNTER — Encounter: Payer: Self-pay | Admitting: Cardiology

## 2019-06-28 VITALS — BP 176/114 | HR 72 | Ht 72.0 in | Wt 243.0 lb

## 2019-06-28 DIAGNOSIS — I5042 Chronic combined systolic (congestive) and diastolic (congestive) heart failure: Secondary | ICD-10-CM

## 2019-06-28 DIAGNOSIS — I420 Dilated cardiomyopathy: Secondary | ICD-10-CM | POA: Diagnosis not present

## 2019-06-28 DIAGNOSIS — R0609 Other forms of dyspnea: Secondary | ICD-10-CM

## 2019-06-28 DIAGNOSIS — R0600 Dyspnea, unspecified: Secondary | ICD-10-CM | POA: Diagnosis not present

## 2019-06-28 DIAGNOSIS — I10 Essential (primary) hypertension: Secondary | ICD-10-CM

## 2019-06-28 DIAGNOSIS — I428 Other cardiomyopathies: Secondary | ICD-10-CM

## 2019-06-28 NOTE — Progress Notes (Signed)
Cardiology Office Note:    Date:  06/28/2019   ID:  Jeffery Glenn, DOB June 19, 1970, MRN [id number]  PCP:  [facility]  Cardiologist:  Jenne Campus, MD    Referring MD: [facility]   No chief complaint on file. Doing fine  History of Present Illness:    Jeffery Glenn is a 49 y.o. male with cardiomyopathy initial ejection fraction 25 to 30% diameter echocardiogram done in January 2021 showed improvement in left ventricle ejection fraction to 30 to 35%, recent coronary CT angio showed no coronary artery disease.  Therefore, with dealing with nonischemic cardiomyopathy.  Gradually try to put him on the correct medications.  There is some difficulty with compliance but seems to be taking majority of time.  Overall doing well.  Described to have some exertional shortness of breath but overall doing well.  Denies have any chest pain no dizziness no passing out no palpitations.  Past Medical History:  Diagnosis Date  . Atypical chest pain 05/17/2018  . Cardiomyopathy ([place]) ejection fraction 25 to 30% in February 2020, improvement to 30 to 35% in January 2021 06/09/2018   Ejection fraction 25 to 30% on echocardiogram from February 2020  . Congestive heart failure (CHF) ([place]) 05/06/2018  . Dyspnea on exertion 05/17/2018  . Essential hypertension 05/17/2018  . Hypertension     Past Surgical History:  Procedure Laterality Date  . HAND SURGERY    . NASAL SEPTUM SURGERY      Current Medications: Current Meds  Medication Sig  . amLODipine (NORVASC) 10 MG tablet Take 10 mg by mouth daily.  Marland Kitchen atorvastatin (LIPITOR) 20 MG tablet Take 20 mg by mouth daily.  . carvedilol (COREG) 25 MG tablet Take 1 tablet (25 mg total) by mouth 2 (two) times daily.  Marland Kitchen ENTRESTO 97-103 MG TAKE 1 TABLET BY MOUTH TWICE DAILY  . fluticasone (FLONASE) 50 MCG/ACT nasal spray Place 1-2 sprays into both nostrils as needed.  . furosemide (LASIX) 40 MG tablet Take 40 mg by mouth daily.   Marland Kitchen loratadine (CLARITIN) 10 MG tablet Take 10 mg by mouth daily.  Marland Kitchen spironolactone (ALDACTONE) 25 MG tablet Take 0.5 tablets (12.5 mg total) by mouth daily.     Allergies:   Shellfish allergy and Peanut-containing drug products   Social History   Socioeconomic History  . Marital status: Married    Spouse name: Not on file  . Number of children: Not on file  . Years of education: Not on file  . Highest education level: Not on file  Occupational History  . Not on file  Tobacco Use  . Smoking status: Never Smoker  . Smokeless tobacco: Never Used  Substance and Sexual Activity  . Alcohol use: Yes  . Drug use: No  . Sexual activity: Not on file  Other Topics Concern  . Not on file  Social History Narrative  . Not on file   Social Determinants of Health   Financial Resource Strain:   . Difficulty of Paying Living Expenses:   Food Insecurity:   . Worried About Charity fundraiser in the Last Year:   . Arboriculturist in the Last Year:   Transportation Needs:   . Film/video editor (Medical):   Marland Kitchen Lack of Transportation (Non-Medical):   Physical Activity:   . Days of Exercise per Week:   . Minutes of Exercise per Session:   Stress:   . Feeling of Stress :  Social Connections:   . Frequency of Communication with Friends and Family:   . Frequency of Social Gatherings with Friends and Family:   . Attends Religious Services:   . Active Member of Clubs or Organizations:   . Attends Banker Meetings:   Marland Kitchen Marital Status:      Family History: The patient's family history includes Heart attack in his father and mother; Hypertension in his father and mother. ROS:   Please see the history of present illness.    All 14 point review of systems negative except as described per history of present illness  EKGs/Labs/Other Studies Reviewed:      Recent Labs: 10/06/2018: NT-Pro BNP 74 06/06/2019: BUN 10; Creatinine, Ser 0.87; Hemoglobin 14.3; Platelets 193;  Potassium 3.5; Sodium 136  Recent Lipid Panel    Component Value Date/Time   CHOL 184 01/31/2008 2114   TRIG 62 01/31/2008 2114   HDL 52 01/31/2008 2114   CHOLHDL 3.5 Ratio 01/31/2008 2114   VLDL 12 01/31/2008 2114   LDLCALC 120 (H) 01/31/2008 2114    Physical Exam:    VS:  BP (!) 176/114   Pulse 72   Ht 6' (1.829 m)   Wt 243 lb (110.2 kg)   SpO2 97%   BMI 32.96 kg/m     Wt Readings from Last 3 Encounters:  06/28/19 243 lb (110.2 kg)  06/06/19 225 lb (102.1 kg)  03/30/19 237 lb (107.5 kg)     GEN:  Well nourished, well developed in no acute distress HEENT: Normal NECK: No JVD; No carotid bruits LYMPHATICS: No lymphadenopathy CARDIAC: RRR, no murmurs, no rubs, no gallops RESPIRATORY:  Clear to auscultation without rales, wheezing or rhonchi  ABDOMEN: Soft, non-tender, non-distended MUSCULOSKELETAL:  No edema; No deformity  SKIN: Warm and dry LOWER EXTREMITIES: no swelling NEUROLOGIC:  Alert and oriented x 3 PSYCHIATRIC:  Normal affect   ASSESSMENT:    1. Dilated cardiomyopathy (HCC)   2. Chronic combined systolic and diastolic congestive heart failure (HCC)   3. Essential hypertension   4. Dyspnea on exertion    PLAN:    In order of problems listed above:  1. Dilated cardiomyopathy on appropriate medications I will check his Chem-7 today.  We initiated conversation about combination of hydralazine and isosorbide which he will have to take 3 times a day.  He is reluctant to do it but I think we have to push towards that.  Especially, in view of the fact that his blood pressure is still not well controlled. 2. Congestive heart failure New [place] Heart Association class I/II.  Continue 3. Essential hypertension still not well controlled but he said that he changed his schedule how he takes his medications.  I am worried he probably skip some.  We talked in length about importance of taking his medications.  He understand he will try to do it.  Also ask him to check  blood pressure measurements every other day and send results to me. 4. Dyspnea on exertion doing well from that point review. 5. Ejection fraction 30 to 35%.  He will be referred to EP team for consideration of ICD.   Medication Adjustments/Labs and Tests Ordered: Current medicines are reviewed at length with the patient today.  Concerns regarding medicines are outlined above.  No orders of the defined types were placed in this encounter.  Medication changes: No orders of the defined types were placed in this encounter.   Signed, Georgeanna Lea, MD, [facility] 06/28/2019 10:25  AM    [place]

## 2019-06-28 NOTE — Patient Instructions (Addendum)
Medication Instructions:  Your physician recommends that you continue on your current medications as directed. Please refer to the Current Medication list given to you today.  *If you need a refill on your cardiac medications before your next appointment, please call your pharmacy*   Lab Work: Bmp- today   If you have labs (blood work) drawn today and your tests are completely normal, you will receive your results only by: Marland Kitchen MyChart Message (if you have MyChart) OR . A paper copy in the mail If you have any lab test that is abnormal or we need to change your treatment, we will call you to review the results.   Testing/Procedures: Your physician has referred you to see Loman Brooklyn, MD for a ICD    Follow-Up: At [facility], you and your health needs are our priority.  As part of our continuing mission to provide you with exceptional heart care, we have created designated Provider Care Teams.  These Care Teams include your primary Cardiologist (physician) and Advanced Practice Providers (APPs -  Physician Assistants and Nurse Practitioners) who all work together to provide you with the care you need, when you need it.  We recommend signing up for the patient portal called "MyChart".  Sign up information is provided on this After Visit Summary.  MyChart is used to connect with patients for Virtual Visits (Telemedicine).  Patients are able to view lab/test results, encounter notes, upcoming appointments, etc.  Non-urgent messages can be sent to your provider as well.   To learn more about what you can do with MyChart, go to [email or web address].    Your next appointment:   3 month(s)  The format for your next appointment:   In Person  Provider:   Gypsy Balsam, MD   Other Instructions None

## 2019-06-29 LAB — BASIC METABOLIC PANEL
BUN/Creatinine Ratio: 14 (ref 9–20)
BUN: 13 mg/dL (ref 6–24)
CO2: 22 mmol/L (ref 20–29)
Calcium: 9.6 mg/dL (ref 8.7–10.2)
Chloride: 105 mmol/L (ref 96–106)
Creatinine, Ser: 0.92 mg/dL (ref 0.76–1.27)
GFR calc Af Amer: 114 mL/min/{1.73_m2} (ref >59)
GFR calc non Af Amer: 99 mL/min/{1.73_m2} (ref >59)
Glucose: 106 mg/dL — ABNORMAL HIGH (ref 65–99)
Potassium: 4.2 mmol/L (ref 3.5–5.2)
Sodium: 143 mmol/L (ref 134–144)

## 2019-08-19 ENCOUNTER — Other Ambulatory Visit: Payer: Self-pay

## 2019-08-19 ENCOUNTER — Emergency Department (HOSPITAL_BASED_OUTPATIENT_CLINIC_OR_DEPARTMENT_OTHER)
Admission: EM | Admit: 2019-08-19 | Discharge: 2019-08-19 | Disposition: A | Discharge status: Home / Self Care | Payer: Medicaid Other | Attending: Emergency Medicine | Admitting: Emergency Medicine

## 2019-08-19 ENCOUNTER — Encounter (HOSPITAL_BASED_OUTPATIENT_CLINIC_OR_DEPARTMENT_OTHER): Payer: Self-pay | Admitting: Emergency Medicine

## 2019-08-19 DIAGNOSIS — R42 Dizziness and giddiness: Secondary | ICD-10-CM | POA: Diagnosis not present

## 2019-08-19 DIAGNOSIS — Z91010 Allergy to peanuts: Secondary | ICD-10-CM | POA: Insufficient documentation

## 2019-08-19 DIAGNOSIS — I509 Heart failure, unspecified: Secondary | ICD-10-CM | POA: Insufficient documentation

## 2019-08-19 DIAGNOSIS — R5383 Other fatigue: Secondary | ICD-10-CM | POA: Diagnosis not present

## 2019-08-19 DIAGNOSIS — I110 Hypertensive heart disease with heart failure: Secondary | ICD-10-CM | POA: Insufficient documentation

## 2019-08-19 DIAGNOSIS — T881XXA Other complications following immunization, not elsewhere classified, initial encounter: Secondary | ICD-10-CM

## 2019-08-19 DIAGNOSIS — R509 Fever, unspecified: Secondary | ICD-10-CM | POA: Diagnosis present

## 2019-08-19 DIAGNOSIS — Z79899 Other long term (current) drug therapy: Secondary | ICD-10-CM | POA: Insufficient documentation

## 2019-08-19 DIAGNOSIS — M7918 Myalgia, other site: Secondary | ICD-10-CM | POA: Diagnosis not present

## 2019-08-19 LAB — BASIC METABOLIC PANEL
Anion gap: 9 (ref 5–15)
BUN: 12 mg/dL (ref 6–20)
CO2: 25 mmol/L (ref 22–32)
Calcium: 8.7 mg/dL — ABNORMAL LOW (ref 8.9–10.3)
Chloride: 102 mmol/L (ref 98–111)
Creatinine, Ser: 1.02 mg/dL (ref 0.61–1.24)
GFR calc Af Amer: >60 mL/min (ref >60)
GFR calc non Af Amer: >60 mL/min (ref >60)
Glucose, Bld: 95 mg/dL (ref 70–99)
Potassium: 4.2 mmol/L (ref 3.5–5.1)
Sodium: 136 mmol/L (ref 135–145)

## 2019-08-19 MED ORDER — KETOROLAC TROMETHAMINE 15 MG/ML IJ SOLN
15.0000 mg | Freq: Once | INTRAMUSCULAR | Status: AC
  Administered 2019-08-19: 15 mg via INTRAMUSCULAR
  Filled 2019-08-19: qty 1

## 2019-08-19 MED ORDER — ACETAMINOPHEN 325 MG PO TABS
650.0000 mg | ORAL_TABLET | Freq: Once | ORAL | Status: AC
  Administered 2019-08-19: 650 mg via ORAL
  Filled 2019-08-19: qty 2

## 2019-08-19 NOTE — ED Provider Notes (Signed)
[facility] EMERGENCY DEPARTMENT Provider Note   CSN: [id number] Arrival date & time: 08/19/19  1519     History Chief Complaint  Patient presents with   Generalized Body Aches    Jeffery Glenn is a 49 y.o. male.  Patient with history of dilated cardiomyopathy, CHF on daily furosemide --presents to the emergency department with complaint of fever, body aches, fatigue, and dizziness after receiving second Moderna Covid vaccine yesterday at 3 PM.  Patient states that he woke up around 5 AM today with the symptoms.  He states "I feel like I was hit by a truck".  He denies any chest pain, shortness of breath, cough.  He has had minor nasal congestion.  No ear pain, sore throat.  No vomiting or diarrhea.  No lower extremity swelling.  He states this feels completely different than when he has had congestive heart failure.  No treatments prior to arrival.        Past Medical History:  Diagnosis Date   Atypical chest pain 05/17/2018   Cardiomyopathy (HCC) ejection fraction 25 to 30% in February 2020, improvement to 30 to 35% in January 2021 06/09/2018   Ejection fraction 25 to 30% on echocardiogram from February 2020   Congestive heart failure (CHF) (HCC) 05/06/2018   Dyspnea on exertion 05/17/2018   Essential hypertension 05/17/2018   Hypertension     Patient Active Problem List   Diagnosis Date Noted   Cardiomyopathy (HCC) ejection fraction 25 to 30% in February 2020, improvement to 30 to 35% in January 2021 06/09/2018   Dyspnea on exertion 05/17/2018   Atypical chest pain 05/17/2018   Essential hypertension 05/17/2018   Congestive heart failure (CHF) (HCC) 05/06/2018    Past Surgical History:  Procedure Laterality Date   HAND SURGERY     NASAL SEPTUM SURGERY         Family History  Problem Relation Age of Onset   Heart attack Mother    Hypertension Mother    Heart attack Father    Hypertension Father     Social History   Tobacco Use    Smoking status: Never Smoker   Smokeless tobacco: Never Used  Vaping Use   Vaping Use: Never used  Substance Use Topics   Alcohol use: Yes   Drug use: No    Home Medications Prior to Admission medications   Medication Sig Start Date End Date Taking? Authorizing Provider  amLODipine (NORVASC) 10 MG tablet Take 10 mg by mouth daily. 03/28/19   [provider]  atorvastatin (LIPITOR) 20 MG tablet Take 20 mg by mouth daily.    [provider]  carvedilol (COREG) 25 MG tablet Take 1 tablet (25 mg total) by mouth 2 (two) times daily. 03/07/19   Georgeanna Lea, MD  ENTRESTO 97-103 MG TAKE 1 TABLET BY MOUTH TWICE DAILY 03/28/19   Georgeanna Lea, MD  fluticasone [facility]) 50 MCG/ACT nasal spray Place 1-2 sprays into both nostrils as needed. 11/09/18   [provider]  furosemide (LASIX) 40 MG tablet Take 40 mg by mouth daily.    [provider]  loratadine (CLARITIN) 10 MG tablet Take 10 mg by mouth daily. 02/27/19   [provider]  spironolactone (ALDACTONE) 25 MG tablet Take 0.5 tablets (12.5 mg total) by mouth daily. 01/23/19   Alver Sorrow, NP    Allergies    Shellfish allergy and Peanut-containing drug products  Review of Systems   Review of Systems  Constitutional: Positive for  chills, fatigue and fever.  HENT: Positive for congestion. Negative for rhinorrhea and sore throat.   Eyes: Negative for redness.  Respiratory: Negative for cough and shortness of breath.   Cardiovascular: Negative for chest pain.  Gastrointestinal: Negative for abdominal pain, diarrhea, nausea and vomiting.  Genitourinary: Negative for dysuria.  Musculoskeletal: Positive for myalgias.  Skin: Negative for rash.  Neurological: Positive for dizziness and headaches.    Physical Exam Updated Vital Signs BP (!) 156/94 (BP Location: Left Arm)    Pulse 99    Temp (!) 100.4 F (38 C) (Oral)    Resp 18    Ht 6' (1.829 m)    Wt 105.2 kg    SpO2 98%     BMI 31.46 kg/m   Physical Exam Vitals and nursing note reviewed.  Constitutional:      Appearance: He is well-developed.     Comments: Warm to touch  HENT:     Head: Normocephalic and atraumatic.     Jaw: No trismus.     Right Ear: Tympanic membrane, ear canal and external ear normal.     Left Ear: Tympanic membrane, ear canal and external ear normal.     Nose: No mucosal edema, congestion or rhinorrhea.     Mouth/Throat:     Mouth: Mucous membranes are not dry.     Pharynx: Uvula midline. No oropharyngeal exudate, posterior oropharyngeal erythema or uvula swelling.     Tonsils: No tonsillar abscesses.  Eyes:     General:        Right eye: No discharge.        Left eye: No discharge.     Conjunctiva/sclera: Conjunctivae normal.  Cardiovascular:     Rate and Rhythm: Normal rate and regular rhythm.     Heart sounds: Normal heart sounds.  Pulmonary:     Effort: Pulmonary effort is normal. No respiratory distress.     Breath sounds: Normal breath sounds. No wheezing or rales.  Abdominal:     Palpations: Abdomen is soft.     Tenderness: There is no abdominal tenderness.  Musculoskeletal:     Cervical back: Normal range of motion and neck supple.     Right lower leg: No edema.     Left lower leg: No edema.  Skin:    General: Skin is warm and dry.  Neurological:     Mental Status: He is alert.     ED Results / Procedures / Treatments   Labs (all labs ordered are listed, but only abnormal results are displayed) Labs Reviewed  BASIC METABOLIC PANEL - Abnormal; Notable for the following components:      Result Value   Calcium 8.7 (*)    All other components within normal limits    EKG None  Radiology No results found.  Procedures Procedures (including critical care time)  Medications Ordered in ED Medications  acetaminophen (TYLENOL) tablet 650 mg (650 mg Oral Given 08/19/19 1632)  ketorolac (TORADOL) 15 MG/ML injection 15 mg (15 mg Intramuscular Given 08/19/19  1632)    ED Course  I have reviewed the triage vital signs and the nursing notes.  Pertinent labs & imaging results that were available during my care of the patient were reviewed by me and considered in my medical decision making (see chart for details).  Patient seen and examined.  Discussed symptoms with the patient in light of recent second Covid vaccine.  The timing and constellation of symptoms are suggestive of reaction to the Covid  vaccine.  Offered labs, however patient is comfortable with not obtaining these in light of this discussion.  Will give dose of oral Tylenol and IM Toradol.  If he does well with these, anticipate discharged home with rest and good hydration.  He seems reliable to return if any of his symptoms worsen.  Vital signs reviewed and are as follows: BP (!) 156/94 (BP Location: Left Arm)    Pulse 99    Temp (!) 100.4 F (38 C) (Oral)    Resp 18    Ht 6' (1.829 m)    Wt 105.2 kg    SpO2 98%    BMI 31.46 kg/m   5:05 PM EKG reviewed.  Patient has peaking of the T waves in V3, however magnitude a small when compared to the R wave.  Given use of Lasix, discussed checking potassium with the patient and he agrees.  If this is okay, plan for discharge home.  5:28 PM potassium is normal.  Patient doing well.  Will discharge as above.    MDM Rules/Calculators/A&P                          Patient with constellation of symptoms expected after second Covid vaccine.  Potassium is normal today.  Patient clinically appears well.  Plan as above.   Final Clinical Impression(s) / ED Diagnoses Final diagnoses:  Postimmunization reaction, initial encounter    Rx / DC Orders ED Discharge Orders    None       Carlisle Cater, PA-C 08/19/19 1729    Blanchie Dessert, MD 08/20/19 [phone]

## 2019-08-19 NOTE — ED Triage Notes (Signed)
Pt woke up this morning with dizziness and body aches.  Pt received 2nd COVID injection yesterday.

## 2019-08-19 NOTE — Discharge Instructions (Addendum)
Your symptoms today are consistent with the common reaction that can occur after receiving your second Covid vaccine.  I would expect you to feel much better over the next 24 to 48 hours.  If you do not, you should follow-up with your primary care doctor.  If you develop other symptoms including shortness of breath, cough, persistent vomiting --you need to follow-up with your doctor or return to the emergency department.  I encourage you to take Tylenol as directed on the packaging for fever as well as ibuprofen for body aches.

## 2019-08-21 ENCOUNTER — Ambulatory Visit: Payer: Medicaid Other | Admitting: Cardiology

## 2019-09-04 ENCOUNTER — Institutional Professional Consult (permissible substitution): Payer: Medicaid Other | Admitting: Cardiology

## 2019-10-09 ENCOUNTER — Encounter: Payer: Self-pay | Admitting: Cardiology

## 2019-10-09 ENCOUNTER — Other Ambulatory Visit: Payer: Self-pay

## 2019-10-09 ENCOUNTER — Ambulatory Visit: Payer: Medicaid Other | Admitting: Cardiology

## 2019-10-09 VITALS — BP 160/116 | HR 81 | Ht 72.0 in | Wt 234.0 lb

## 2019-10-09 DIAGNOSIS — I5042 Chronic combined systolic (congestive) and diastolic (congestive) heart failure: Secondary | ICD-10-CM | POA: Diagnosis not present

## 2019-10-09 DIAGNOSIS — I420 Dilated cardiomyopathy: Secondary | ICD-10-CM

## 2019-10-09 NOTE — Progress Notes (Signed)
Electrophysiology Office Note   Date:  10/09/2019   ID:  Jeffery Glenn, DOB [birth year], MRN [id number]  PCP:  [facility]  Cardiologist:  Jeffery Glenn Primary Electrophysiologist:  Jeffery Heathman Jorja Loa, MD    Chief Complaint: CHF   History of Present Illness: Jeffery Glenn is a 49 y.o. male who is being seen today for the evaluation of CHF at the request of Jeffery Lea, MD. Presenting today for electrophysiology evaluation.  He has a history significant for nonischemic cardiomyopathy, hypertension.  He had a coronary CT that showed no evidence of coronary artery disease.  Today, he denies symptoms of palpitations, chest pain, shortness of breath, orthopnea, PND, lower extremity edema, claudication, dizziness, presyncope, syncope, bleeding, or neurologic sequela. The patient is tolerating medications without difficulties.  Overall he is feeling well.  He does get shortness of breath when he exerts himself, though he has learned to adjust his activity level.  He has been tolerating his heart failure medications without issue.  He continues to work, and has actually recently gotten a promotion.   Past Medical History:  Diagnosis Date  . Atypical chest pain 05/17/2018  . Cardiomyopathy (HCC) ejection fraction 25 to 30% in February 2020, improvement to 30 to 35% in January 2021 06/09/2018   Ejection fraction 25 to 30% on echocardiogram from February 2020  . Congestive heart failure (CHF) (HCC) 05/06/2018  . Dyspnea on exertion 05/17/2018  . Essential hypertension 05/17/2018  . Hypertension    Past Surgical History:  Procedure Laterality Date  . HAND SURGERY    . NASAL SEPTUM SURGERY       Current Outpatient Medications  Medication Sig Dispense Refill  . amLODipine (NORVASC) 10 MG tablet Take 10 mg by mouth daily.    Marland Kitchen atorvastatin (LIPITOR) 20 MG tablet Take 20 mg by mouth daily.    . carvedilol (COREG) 25 MG tablet Take 1 tablet (25 mg total) by mouth 2  (two) times daily. 180 tablet 3  . ENTRESTO 97-103 MG TAKE 1 TABLET BY MOUTH TWICE DAILY 180 tablet 2  . fluticasone (FLONASE) 50 MCG/ACT nasal spray Place 1-2 sprays into both nostrils as needed.    . furosemide (LASIX) 40 MG tablet Take 40 mg by mouth daily.    Marland Kitchen loratadine (CLARITIN) 10 MG tablet Take 10 mg by mouth daily.    Marland Kitchen spironolactone (ALDACTONE) 25 MG tablet Take 0.5 tablets (12.5 mg total) by mouth daily. 45 tablet 1   No current facility-administered medications for this visit.    Allergies:   Shellfish allergy and Peanut-containing drug products   Social History:  The patient  reports that he has never smoked. He has never used smokeless tobacco. He reports current alcohol use. He reports that he does not use drugs.   Family History:  The patient's family history includes Heart attack in his father and mother; Hypertension in his father and mother.    ROS:  Please see the history of present illness.   Otherwise, review of systems is positive for none.   All other systems are reviewed and negative.    PHYSICAL EXAM: VS:  BP (!) 160/116   Pulse 81   Ht 6' (1.829 m)   Wt (!) 234 lb (106.1 kg)   SpO2 98%   BMI 31.74 kg/m  , BMI Body mass index is 31.74 kg/m. GEN: Well nourished, well developed, in no acute distress  HEENT: normal  Neck: no JVD, carotid bruits, or masses Cardiac:  RRR; no murmurs, rubs, or gallops,no edema  Respiratory:  clear to auscultation bilaterally, normal work of breathing GI: soft, nontender, nondistended, + BS MS: no deformity or atrophy  Skin: warm and dry Neuro:  Strength and sensation are intact Psych: euthymic mood, full affect  EKG:  EKG is not ordered today. Personal review of the ekg ordered 08/19/19 shows sinus rhythm, PVCs, LVH with polarization abnormalities  Recent Labs: 06/06/2019: Hemoglobin 14.3; Platelets 193 08/19/2019: BUN 12; Creatinine, Ser 1.02; Potassium 4.2; Sodium 136    Lipid Panel     Component Value Date/Time     CHOL 184 01/31/2008 2114   TRIG 62 01/31/2008 2114   HDL 52 01/31/2008 2114   CHOLHDL 3.5 Ratio 01/31/2008 2114   VLDL 12 01/31/2008 2114   LDLCALC 120 (H) 01/31/2008 2114     Wt Readings from Last 3 Encounters:  10/09/19 (!) 234 lb (106.1 kg)  08/19/19 232 lb (105.2 kg)  06/28/19 243 lb (110.2 kg)      Other studies Reviewed: Additional studies/ records that were reviewed today include: TTE 03/15/19  Review of the above records today demonstrates:  1. Left ventricular ejection fraction, by visual estimation, is 30 to  35%. The left ventricle has moderately decreased function. There is  severely increased left ventricular hypertrophy.  2. The left ventricle demonstrates global hypokinesis.  3. Left ventricular diastolic parameters are consistent with Grade II  diastolic dysfunction (pseudonormalization).  4. Global right ventricle has normal systolic function.The right  ventricular size is normal. No increase in right ventricular wall  thickness.  5. Left atrial size was normal.  6. Right atrial size was normal.  7. The mitral valve is normal in structure. Trivial mitral valve  regurgitation. No evidence of mitral stenosis.  8. The tricuspid valve is normal in structure. Trivial Tricuspid  regurgitation.  9. The aortic valve is normal in structure. Aortic valve regurgitation is  not visualized. No evidence of aortic valve sclerosis or stenosis.  10. The pulmonic valve was normal in structure. Pulmonic valve  regurgitation is not visualized.  11. Unable to determine Pulmonary Artery Systolic Pressure, No TR spectral  jet display.   Coronary CTA 06/15/19 1. Coronary calcium score of 0. This was 0 percentile for age and sex matched control. 2. Normal coronary origin with right dominance.  Ramus branch noted. 3. No evidence of CAD. 4. There is left ventricular hypertrophy (20 mm septum and 16 mm lateral wall). 5. Non ischemic cardiomyopathy, probably hypertensive  in etiology.  ASSESSMENT AND PLAN:  1.  Congestive heart failure due to nonischemic cardiomyopathy: Currently on optimal medical therapy.  He is on carvedilol, Entresto, Aldactone.  Repeat echo shows an ejection fraction of 30 to 35%.  I spoke with him about the possibility of an ICD.  I do think he would agree to the device, though his ejection fraction did improve after his last echo.  He would like to give it a little bit more time to see if he Ruben Mahler have complete improvement.  We Traniece Boffa repeat echo in approximately 3 months.  2.  Hypertension: Elevated today.  He says that since he has been on his heart failure medications it has been much improved.  He did not take his blood pressure medicines yet this morning.  Case discussed with primary cardiology  Current medicines are reviewed at length with the patient today.   The patient does not have concerns regarding his medicines.  The following changes were made today:  none  Labs/ tests ordered today include:  Orders Placed This Encounter  Procedures  . ECHOCARDIOGRAM COMPLETE     Disposition:   FU with Knoah Nedeau 3 months  Signed, Lakeyn Dokken Jorja Loa, MD  10/09/2019 10:23 AM     [facility] [address] [place] [address] [phone] (office) [phone] (fax)

## 2019-10-09 NOTE — Patient Instructions (Addendum)
Medication Instructions:  Your physician recommends that you continue on your current medications as directed. Please refer to the Current Medication list given to you today.  *If you need a refill on your cardiac medications before your next appointment, please call your pharmacy*   Lab Work: None ordered.  If you have labs (blood work) drawn today and your tests are completely normal, you will receive your results only by: Marland Kitchen MyChart Message (if you have MyChart) OR . A paper copy in the mail If you have any lab test that is abnormal or we need to change your treatment, we will call you to review the results.   Testing/Procedures: Your physician has requested that you have an echocardiogram. Echocardiography is a painless test that uses sound waves to create images of your heart. It provides your doctor with information about the size and shape of your heart and how well your heart's chambers and valves are working. This procedure takes approximately one hour. There are no restrictions for this procedure.     Follow-Up: At [facility], you and your health needs are our priority.  As part of our continuing mission to provide you with exceptional heart care, we have created designated Provider Care Teams.  These Care Teams include your primary Cardiologist (physician) and Advanced Practice Providers (APPs -  Physician Assistants and Nurse Practitioners) who all work together to provide you with the care you need, when you need it.  We recommend signing up for the patient portal called "MyChart".  Sign up information is provided on this After Visit Summary.  MyChart is used to connect with patients for Virtual Visits (Telemedicine).  Patients are able to view lab/test results, encounter notes, upcoming appointments, etc.  Non-urgent messages can be sent to your provider as well.   To learn more about what you can do with MyChart, go to [email or web address].    Your next appointment:    01/16/2020 at 945am  The format for your next appointment:   In Person  Provider:   Loman Brooklyn, MD    Cardioverter Defibrillator Implantation  An implantable cardioverter defibrillator (ICD) is a small device that is placed under the skin in the chest or abdomen. An ICD consists of a battery, a small computer (pulse generator), and wires (leads) that go into the heart. An ICD is used to detect and correct two types of dangerous irregular heartbeats (arrhythmias):  A rapid heart rhythm (tachycardia).  An arrhythmia in which the lower chambers of the heart (ventricles) contract in an uncoordinated way (fibrillation). When an ICD detects tachycardia, it sends a low-energy shock to the heart to restore the heartbeat to normal (cardioversion). This signal is usually painless. If cardioversion does not work or if the ICD detects fibrillation, it delivers a high-energy shock to the heart (defibrillation) to restart the heart. This shock may feel like a strong jolt in the chest. Your health care provider may prescribe an ICD if:  You have had an arrhythmia that originated in the ventricles.  Your heart has been damaged by a disease or heart condition. Sometimes, ICDs are programmed to act as a device called a pacemaker. Pacemakers can be used to treat a slow heartbeat (bradycardia) or tachycardia by taking over the heart rate with electrical impulses. Tell a health care provider about:  Any allergies you have.  All medicines you are taking, including vitamins, herbs, eye drops, creams, and over-the-counter medicines.  Any problems you or family members have had with anesthetic medicines.  Any blood disorders you have.  Any surgeries you have had.  Any medical conditions you have.  Whether you are pregnant or may be pregnant. What are the risks? Generally, this is a safe procedure. However, problems may occur, including:  Swelling, bleeding, or bruising.  Infection.  Blood  clots.  Damage to other structures or organs, such as nerves, blood vessels, or the heart.  Allergic reactions to medicines used during the procedure. What happens before the procedure? Staying hydrated Follow instructions from your health care provider about hydration, which may include:  Up to 2 hours before the procedure - you may continue to drink clear liquids, such as water, clear fruit juice, black coffee, and plain tea. Eating and drinking restrictions Follow instructions from your health care provider about eating and drinking, which may include:  8 hours before the procedure - stop eating heavy meals or foods such as meat, fried foods, or fatty foods.  6 hours before the procedure - stop eating light meals or foods, such as toast or cereal.  6 hours before the procedure - stop drinking milk or drinks that contain milk.  2 hours before the procedure - stop drinking clear liquids. Medicine Ask your health care provider about:  Changing or stopping your normal medicines. This is important if you take diabetes medicines or blood thinners.  Taking medicines such as aspirin and ibuprofen. These medicines can thin your blood. Do not take these medicines before your procedure if your doctor tells you not to. Tests  You may have blood tests.  You may have a test to check the electrical signals in your heart (electrocardiogram, ECG).  You may have imaging tests, such as a chest X-ray. General instructions  For 24 hours before the procedure, stop using products that contain nicotine or tobacco, such as cigarettes and e-cigarettes. If you need help quitting, ask your health care provider.  Plan to have someone take you home from the hospital or clinic.  You may be asked to shower with a germ-killing soap. What happens during the procedure?  To reduce your risk of infection: ? Your health care team will wash or sanitize their hands. ? Your skin will be washed with  soap. ? Hair may be removed from the surgical area.  Small monitors will be put on your body. They will be used to check your heart, blood pressure, and oxygen level.  An IV tube will be inserted into one of your veins.  You will be given one or more of the following: ? A medicine to help you relax (sedative). ? A medicine to numb the area (local anesthetic). ? A medicine to make you fall asleep (general anesthetic).  Leads will be guided through a blood vessel into your heart and attached to your heart muscles. Depending on the ICD, the leads may go into one ventricle or they may go into both ventricles and into an upper chamber of the heart. An X-ray machine (fluoroscope) will be usedto help guide the leads.  A small incision will be made to create a deep pocket under your skin.  The pulse generator will be placed into the pocket.  The ICD will be tested.  The incision will be closed with stitches (sutures), skin glue, or staples.  A bandage (dressing) will be placed over the incision. This procedure may vary among health care providers and hospitals. What happens after the procedure?  Your blood pressure, heart rate, breathing rate, and blood oxygen level will be  monitored often until the medicines you were given have worn off.  A chest X-ray will be taken to check that the ICD is in the right place.  You will need to stay in the hospital for 1-2 days so your health care provider can make sure your ICD is working.  Do not drive for 24 hours if you received a sedative. Ask your health care provider when it is safe for you to drive.  You may be given an identification card explaining that you have an ICD. Summary  An implantable cardioverter defibrillator (ICD) is a small device that is placed under the skin in the chest or abdomen. It is used to detect and correct dangerous irregular heartbeats (arrhythmias).  An ICD consists of a battery, a small computer (pulse generator),  and wires (leads) that go into the heart.  When an ICD detects rapid heart rhythm (tachycardia), it sends a low-energy shock to the heart to restore the heartbeat to normal (cardioversion). If cardioversion does not work or if the ICD detects uncoordinated heart contractions (fibrillation), it delivers a high-energy shock to the heart (defibrillation) to restart the heart.  You will need to stay in the hospital for 1-2 days to make sure your ICD is working. This information is not intended to replace advice given to you by your health care provider. Make sure you discuss any questions you have with your health care provider. Document Revised: 02/05/2017 Document Reviewed: 03/04/2016 Elsevier Patient Education  2020 [employer]. .

## 2019-10-10 ENCOUNTER — Ambulatory Visit: Payer: Medicaid Other | Admitting: Cardiology

## 2019-10-26 ENCOUNTER — Other Ambulatory Visit (HOSPITAL_COMMUNITY): Payer: Medicaid Other

## 2019-10-27 ENCOUNTER — Other Ambulatory Visit: Payer: Self-pay

## 2019-10-27 ENCOUNTER — Encounter (HOSPITAL_BASED_OUTPATIENT_CLINIC_OR_DEPARTMENT_OTHER): Payer: Self-pay | Admitting: *Deleted

## 2019-10-27 ENCOUNTER — Emergency Department (HOSPITAL_BASED_OUTPATIENT_CLINIC_OR_DEPARTMENT_OTHER)
Admission: EM | Admit: 2019-10-27 | Discharge: 2019-10-27 | Disposition: A | Discharge status: Home / Self Care | Payer: Medicaid Other | Attending: Emergency Medicine | Admitting: Emergency Medicine

## 2019-10-27 ENCOUNTER — Emergency Department (HOSPITAL_BASED_OUTPATIENT_CLINIC_OR_DEPARTMENT_OTHER): Payer: Medicaid Other

## 2019-10-27 DIAGNOSIS — Y9289 Other specified places as the place of occurrence of the external cause: Secondary | ICD-10-CM | POA: Diagnosis not present

## 2019-10-27 DIAGNOSIS — I110 Hypertensive heart disease with heart failure: Secondary | ICD-10-CM | POA: Diagnosis not present

## 2019-10-27 DIAGNOSIS — Z79899 Other long term (current) drug therapy: Secondary | ICD-10-CM | POA: Diagnosis not present

## 2019-10-27 DIAGNOSIS — Z91010 Allergy to peanuts: Secondary | ICD-10-CM | POA: Diagnosis not present

## 2019-10-27 DIAGNOSIS — I509 Heart failure, unspecified: Secondary | ICD-10-CM | POA: Insufficient documentation

## 2019-10-27 DIAGNOSIS — S96911A Strain of unspecified muscle and tendon at ankle and foot level, right foot, initial encounter: Secondary | ICD-10-CM | POA: Diagnosis not present

## 2019-10-27 DIAGNOSIS — W228XXA Striking against or struck by other objects, initial encounter: Secondary | ICD-10-CM | POA: Diagnosis not present

## 2019-10-27 DIAGNOSIS — Y999 Unspecified external cause status: Secondary | ICD-10-CM | POA: Diagnosis not present

## 2019-10-27 DIAGNOSIS — Y9389 Activity, other specified: Secondary | ICD-10-CM | POA: Insufficient documentation

## 2019-10-27 DIAGNOSIS — S99921A Unspecified injury of right foot, initial encounter: Secondary | ICD-10-CM | POA: Diagnosis present

## 2019-10-27 NOTE — ED Provider Notes (Signed)
[facility] EMERGENCY DEPARTMENT Provider Note   CSN: [id number] Arrival date & time: 10/27/19  1442     History Chief Complaint  Patient presents with  . Foot Pain    Jeffery Glenn is a 49 y.o. male with past medical history of hypertension, CHF that presents the emergency department today for right foot injury.  Patient states that he was playing kickball with his kids 2 weeks ago, and hurt his foot when sliding on base.  Pain to lateral aspect of foot.  Has not been doing anything for this.  Has been still able to work.  No numbness and tingling on foot.  No previous injury.  No swelling noted.  No fevers, chills, erythema.  Able to bear weight.  Has been slowly improving over the past 2 weeks, however has not fully resolved and therefore came to the ER.  No relieving factors, worse when he walks on it.  HPI     Past Medical History:  Diagnosis Date  . Atypical chest pain 05/17/2018  . Cardiomyopathy (HCC) ejection fraction 25 to 30% in February 2020, improvement to 30 to 35% in January 2021 06/09/2018   Ejection fraction 25 to 30% on echocardiogram from February 2020  . Congestive heart failure (CHF) (HCC) 05/06/2018  . Dyspnea on exertion 05/17/2018  . Essential hypertension 05/17/2018  . Hypertension     Patient Active Problem List   Diagnosis Date Noted  . Cardiomyopathy (HCC) ejection fraction 25 to 30% in February 2020, improvement to 30 to 35% in January 2021 06/09/2018  . Dyspnea on exertion 05/17/2018  . Atypical chest pain 05/17/2018  . Essential hypertension 05/17/2018  . Congestive heart failure (CHF) (HCC) 05/06/2018    Past Surgical History:  Procedure Laterality Date  . HAND SURGERY    . NASAL SEPTUM SURGERY         Family History  Problem Relation Age of Onset  . Heart attack Mother   . Hypertension Mother   . Heart attack Father   . Hypertension Father     Social History   Tobacco Use  . Smoking status: Never Smoker  . Smokeless  tobacco: Never Used  Vaping Use  . Vaping Use: Never used  Substance Use Topics  . Alcohol use: Yes  . Drug use: No    Home Medications Prior to Admission medications   Medication Sig Start Date End Date Taking? Authorizing Provider  amLODipine (NORVASC) 10 MG tablet Take 10 mg by mouth daily. 03/28/19   [provider]  atorvastatin (LIPITOR) 20 MG tablet Take 20 mg by mouth daily.    [provider]  carvedilol (COREG) 25 MG tablet Take 1 tablet (25 mg total) by mouth 2 (two) times daily. 03/07/19   Georgeanna Lea, MD  ENTRESTO 97-103 MG TAKE 1 TABLET BY MOUTH TWICE DAILY 03/28/19   Georgeanna Lea, MD  fluticasone [facility]) 50 MCG/ACT nasal spray Place 1-2 sprays into both nostrils as needed. 11/09/18   [provider]  furosemide (LASIX) 40 MG tablet Take 40 mg by mouth daily.    [provider]  loratadine (CLARITIN) 10 MG tablet Take 10 mg by mouth daily. 02/27/19   [provider]  spironolactone (ALDACTONE) 25 MG tablet Take 0.5 tablets (12.5 mg total) by mouth daily. 01/23/19   Alver Sorrow, NP    Allergies    Shellfish allergy and Peanut-containing drug products  Review of Systems   Review of Systems  Constitutional: Negative for  diaphoresis, fatigue and fever.  Eyes: Negative for visual disturbance.  Respiratory: Negative for shortness of breath.   Cardiovascular: Negative for chest pain.  Gastrointestinal: Negative for nausea and vomiting.  Musculoskeletal: Positive for arthralgias. Negative for back pain and myalgias.  Skin: Negative for color change, pallor, rash and wound.  Neurological: Negative for syncope, weakness, light-headedness, numbness and headaches.  Psychiatric/Behavioral: Negative for behavioral problems and confusion.    Physical Exam Updated Vital Signs BP (!) 160/109   Pulse 76   Temp 98.4 F (36.9 C) (Oral)   Resp 16   Ht 6' (1.829 m)   Wt 105.2 kg   SpO2 98%   BMI 31.46 kg/m    Physical Exam Constitutional:      General: He is not in acute distress.    Appearance: Normal appearance. He is not ill-appearing, toxic-appearing or diaphoretic.  HENT:     Head: Normocephalic and atraumatic.  Cardiovascular:     Rate and Rhythm: Normal rate and regular rhythm.     Pulses: Normal pulses.  Pulmonary:     Effort: Pulmonary effort is normal.     Breath sounds: Normal breath sounds.  Musculoskeletal:        General: Normal range of motion.     Comments: Right foot with tenderness to palpation on lateral aspect of foot, no ecchymosis or edema noted.  No warmth or erythema noted.  Patient with normal strength to foot including toes and ankle.  Normal sensation throughout.  DP PT pulses 2+.  Cap refill less than 2 seconds.  Patient is ambulatory on foot with normal gait.  Skin:    General: Skin is warm and dry.     Capillary Refill: Capillary refill takes less than 2 seconds.  Neurological:     General: No focal deficit present.     Mental Status: He is alert and oriented to person, place, and time.  Psychiatric:        Mood and Affect: Mood normal.        Behavior: Behavior normal.        Thought Content: Thought content normal.     ED Results / Procedures / Treatments   Labs (all labs ordered are listed, but only abnormal results are displayed) Labs Reviewed - No data to display  EKG None  Radiology DG Foot Complete Right  Result Date: 10/27/2019 CLINICAL DATA:  49 year old male with trauma to the right foot. EXAM: RIGHT FOOT COMPLETE - 3+ VIEW COMPARISON:  None. FINDINGS: There is no acute fracture or dislocation. The bones are well mineralized. No significant arthritic changes. Minimal fragmentation of the dorsal cortex of the anterior talus. The soft tissues are unremarkable. IMPRESSION: Negative. Electronically Signed   By: Elgie Collard M.D.   On: 10/27/2019 16:13    Procedures Procedures (including critical care time)  Medications Ordered in  ED Medications - No data to display  ED Course  I have reviewed the triage vital signs and the nursing notes.  Pertinent labs & imaging results that were available during my care of the patient were reviewed by me and considered in my medical decision making (see chart for details).    MDM Rules/Calculators/A&P                         Carmin Dibartolo is a 49 y.o. male with past medical history of hypertension, CHF that presents the emergency department today for right foot injury.  X-ray without any fracture  or dislocation.  Physical exam with tenderness to lateral aspect of foot, patient is distally neurovascularly intact.  Patient is ambulatory on exam with normal gait.  Patient to be discharged with postop shoe, did discuss to patient if pain becomes worse or does not improve to see [facility], patient is agreeable for this at this time.  I think that patient most likely has right foot strain.  Did discuss this with patient and symptomatic treatment.  Patient agreeable.  Patient to be discharged.  Doubt need for further emergent work up at this time. I explained the diagnosis and have given explicit precautions to return to the ER including for any other new or worsening symptoms. The patient understands and accepts the medical plan as it's been dictated and I have answered their questions. Discharge instructions concerning home care and prescriptions have been given. The patient is STABLE and is discharged to home in good condition.  Final Clinical Impression(s) / ED Diagnoses Final diagnoses:  Strain of right foot, initial encounter    Rx / DC Orders ED Discharge Orders    None       Farrel Gordon, PA-C 10/27/19 1819    Milagros Loll, MD 10/30/19 1515

## 2019-10-27 NOTE — Discharge Instructions (Addendum)
Please read and follow all provided instructions.  You have been seen today for foot pain.   Tests performed today include: An x-ray of the affected area - does NOT show any broken bones or dislocations.  Vital signs. See below for your results today.   Home care instructions: -- *PRICE in the first 24-48 hours after injury: Protect (with brace, splint, sling), if given by your provider Rest Ice- Do not apply ice pack directly to your skin, place towel or similar between your skin and ice/ice pack. Apply ice for 20 min, then remove for 40 min while awake Compression- Wear brace, elastic bandage, splint as directed by your provider Elevate affected extremity above the level of your heart when not walking around for the first 24-48 hours   Use Ibuprofen (Motrin/Advil) 600mg  every 6 hours as needed for pain (do not exceed max dose in 24 hours, 2400mg )  Follow-up instructions: Please follow-up with your primary care provider or the provided orthopedic physician (bone specialist) if you continue to have significant pain in 1 week. In this case you may have a more severe injury that requires further care.   Return instructions:  Please return if your toes or feet are numb or tingling, appear gray or blue, or you have severe pain (also elevate the leg and loosen splint or wrap if you were given one) Please return to the Emergency Department if you experience worsening symptoms.  Please return if you have any other emergent concerns. Additional Information:  Your vital signs today were: BP (!) 156/100   Pulse 71   Temp 98.4 F (36.9 C) (Oral)   Resp 14   Ht 6' (1.829 m)   Wt 105.2 kg   SpO2 95%   BMI 31.46 kg/m  If your blood pressure (BP) was elevated above 135/85 this visit, please have this repeated by your doctor within one month. --------------- I also referred you to [facility], please go to them in the next couple of days if your symptoms do not resolve.  Keep wearing the  postop shoe if you feel like it is helping you.

## 2019-10-27 NOTE — ED Triage Notes (Signed)
2 weeks ago he was playing kickball with his kids. Pain across his foot since.

## 2019-12-26 ENCOUNTER — Other Ambulatory Visit (HOSPITAL_COMMUNITY): Payer: Medicaid Other

## 2019-12-26 ENCOUNTER — Encounter (HOSPITAL_COMMUNITY): Payer: Self-pay | Admitting: Cardiology

## 2020-01-01 DIAGNOSIS — I10 Essential (primary) hypertension: Secondary | ICD-10-CM | POA: Insufficient documentation

## 2020-01-05 ENCOUNTER — Ambulatory Visit: Payer: Medicaid Other | Admitting: Cardiology

## 2020-01-08 ENCOUNTER — Telehealth (HOSPITAL_COMMUNITY): Payer: Self-pay | Admitting: Cardiology

## 2020-01-08 NOTE — Telephone Encounter (Signed)
Just an FYI. We have made several attempts to contact this patient including sending a letter to schedule or reschedule their echocardiogram. We will be removing the patient from the echo WQ.  12/26/19 PT No Showed - MAILED LETTER [facility]    Thank you

## 2020-01-10 ENCOUNTER — Telehealth: Payer: Self-pay | Admitting: Cardiology

## 2020-01-10 DIAGNOSIS — I420 Dilated cardiomyopathy: Secondary | ICD-10-CM

## 2020-01-10 NOTE — Telephone Encounter (Signed)
New message:     Patient calling stating that he need another order for a ECHO.

## 2020-01-12 NOTE — Telephone Encounter (Signed)
Order placed. Sent scheduler asking them to schedule.

## 2020-01-12 NOTE — Telephone Encounter (Signed)
Please order another echocardiogram, follow-up cardiomyopathy

## 2020-01-16 ENCOUNTER — Ambulatory Visit (HOSPITAL_COMMUNITY)
Admission: RE | Admit: 2020-01-16 | Discharge: 2020-01-16 | Disposition: A | Discharge status: Home / Self Care | Payer: 59 | Source: Ambulatory Visit | Attending: Cardiology | Admitting: Cardiology

## 2020-01-16 ENCOUNTER — Ambulatory Visit: Payer: Medicaid Other | Admitting: Cardiology

## 2020-01-16 ENCOUNTER — Other Ambulatory Visit: Payer: Self-pay

## 2020-01-16 ENCOUNTER — Encounter (HOSPITAL_COMMUNITY): Payer: Self-pay | Admitting: Cardiology

## 2020-01-16 DIAGNOSIS — R0600 Dyspnea, unspecified: Secondary | ICD-10-CM | POA: Insufficient documentation

## 2020-01-16 DIAGNOSIS — I110 Hypertensive heart disease with heart failure: Secondary | ICD-10-CM | POA: Insufficient documentation

## 2020-01-16 DIAGNOSIS — R079 Chest pain, unspecified: Secondary | ICD-10-CM | POA: Diagnosis not present

## 2020-01-16 DIAGNOSIS — I420 Dilated cardiomyopathy: Secondary | ICD-10-CM | POA: Diagnosis not present

## 2020-01-16 DIAGNOSIS — I509 Heart failure, unspecified: Secondary | ICD-10-CM | POA: Insufficient documentation

## 2020-01-16 DIAGNOSIS — I429 Cardiomyopathy, unspecified: Secondary | ICD-10-CM | POA: Diagnosis not present

## 2020-01-16 LAB — ECHOCARDIOGRAM COMPLETE
Area-P 1/2: 2.24 cm2
Calc EF: 31 %
S' Lateral: 4.9 cm
Single Plane A2C EF: 27.9 %
Single Plane A4C EF: 40.2 %

## 2020-01-16 NOTE — Progress Notes (Signed)
  Echocardiogram 2D Echocardiogram has been performed.  Jeffery Glenn 01/16/2020, 11:57 AM

## 2020-01-17 ENCOUNTER — Other Ambulatory Visit: Payer: Self-pay | Admitting: Cardiology

## 2020-01-17 ENCOUNTER — Other Ambulatory Visit: Payer: Self-pay | Admitting: Family

## 2020-01-25 ENCOUNTER — Ambulatory Visit: Payer: Medicaid Other | Admitting: Cardiology

## 2020-01-25 NOTE — Progress Notes (Deleted)
Electrophysiology Office Note   Date:  01/25/2020   ID:  Jeffery Glenn, DOB Mar 27, 1970, MRN [id number]  PCP:  [facility]  Cardiologist:  Bing Matter Primary Electrophysiologist:  Dorea Duff Jorja Loa, MD    Chief Complaint: CHF   History of Present Illness: Jeffery Glenn is a 49 y.o. male who is being seen today for the evaluation of CHF at the request of [facility] Presenting today for electrophysiology evaluation.  He has a history of nonischemic cardiomyopathy with a coronary CT that showed no evidence of coronary artery disease, and hypertension. His most recent echo showed an ejection fraction of 30 to 35%. This is stable from his echo January 2021.  Today, denies symptoms of palpitations, chest pain, shortness of breath, orthopnea, PND, lower extremity edema, claudication, dizziness, presyncope, syncope, bleeding, or neurologic sequela. The patient is tolerating medications without difficulties. ***    Past Medical History:  Diagnosis Date  . Atypical chest pain 05/17/2018  . Cardiomyopathy (HCC) ejection fraction 25 to 30% in February 2020, improvement to 30 to 35% in January 2021 06/09/2018   Ejection fraction 25 to 30% on echocardiogram from February 2020  . Congestive heart failure (CHF) (HCC) 05/06/2018  . Dyspnea on exertion 05/17/2018  . Essential hypertension 05/17/2018  . Hypertension    Past Surgical History:  Procedure Laterality Date  . HAND SURGERY    . NASAL SEPTUM SURGERY       Current Outpatient Medications  Medication Sig Dispense Refill  . amLODipine (NORVASC) 10 MG tablet Take 10 mg by mouth daily.    Marland Kitchen atorvastatin (LIPITOR) 20 MG tablet Take 20 mg by mouth daily.    . carvedilol (COREG) 12.5 MG tablet TAKE 1 TABLET(12.5 MG) BY MOUTH TWICE DAILY 180 tablet 1  . carvedilol (COREG) 25 MG tablet Take 1 tablet (25 mg total) by mouth 2 (two) times daily. 180 tablet 3  . ENTRESTO 97-103 MG TAKE 1 TABLET BY MOUTH TWICE  DAILY 180 tablet 2  . fluticasone (FLONASE) 50 MCG/ACT nasal spray Place 1-2 sprays into both nostrils as needed.    . furosemide (LASIX) 40 MG tablet Take 40 mg by mouth daily.    Marland Kitchen loratadine (CLARITIN) 10 MG tablet Take 10 mg by mouth daily.    Marland Kitchen spironolactone (ALDACTONE) 25 MG tablet Take 0.5 tablets (12.5 mg total) by mouth daily. 45 tablet 1   No current facility-administered medications for this visit.    Allergies:   Shellfish allergy and Peanut-containing drug products   Social History:  The patient  reports that he has never smoked. He has never used smokeless tobacco. He reports current alcohol use. He reports that he does not use drugs.   Family History:  The patient's family history includes Heart attack in his father and mother; Hypertension in his father and mother.   ROS:  Please see the history of present illness.   Otherwise, review of systems is positive for none.   All other systems are reviewed and negative.   PHYSICAL EXAM: VS:  There were no vitals taken for this visit. , BMI There is no height or weight on file to calculate BMI. GEN: Well nourished, well developed, in no acute distress  HEENT: normal  Neck: no JVD, carotid bruits, or masses Cardiac: ***RRR; no murmurs, rubs, or gallops,no edema  Respiratory:  clear to auscultation bilaterally, normal work of breathing GI: soft, nontender, nondistended, + BS MS: no deformity or atrophy  Skin:  warm and dry Neuro:  Strength and sensation are intact Psych: euthymic mood, full affect  EKG:  EKG {ACTION; IS/IS [id number]} ordered today. Personal review of the ekg ordered *** shows ***   Recent Labs: 06/06/2019: Hemoglobin 14.3; Platelets 193 08/19/2019: BUN 12; Creatinine, Ser 1.02; Potassium 4.2; Sodium 136    Lipid Panel     Component Value Date/Time   CHOL 184 01/31/2008 2114   TRIG 62 01/31/2008 2114   HDL 52 01/31/2008 2114   CHOLHDL 3.5 Ratio 01/31/2008 2114   VLDL 12 01/31/2008 2114   LDLCALC  120 (H) 01/31/2008 2114     Wt Readings from Last 3 Encounters:  10/27/19 232 lb (105.2 kg)  10/09/19 (!) 234 lb (106.1 kg)  08/19/19 232 lb (105.2 kg)      Other studies Reviewed: Additional studies/ records that were reviewed today include: TTE 01/16/20 Review of the above records today demonstrates:  1. Left ventricular ejection fraction, by estimation, is 30 to 35%. The  left ventricle has moderately decreased function. The left ventricle  demonstrates global hypokinesis. The left ventricular internal cavity size  was moderately dilated. Left  ventricular diastolic parameters are consistent with Grade II diastolic  dysfunction (pseudonormalization).  2. Right ventricular systolic function is normal. The right ventricular  size is normal. Tricuspid regurgitation signal is inadequate for assessing  PA pressure.  3. Left atrial size was moderately dilated.  4. The mitral valve is normal in structure. Trivial mitral valve  regurgitation. No evidence of mitral stenosis.  5. The aortic valve is tricuspid. Aortic valve regurgitation is not  visualized. Mild aortic valve sclerosis is present, with no evidence of  aortic valve stenosis.  6. The inferior vena cava is normal in size with greater than 50%  respiratory variability, suggesting right atrial pressure of 3 mmHg.  7. Recommend definity contrast limited study to assess for focal wall  motion abnormalities. Some of the views suggest focal abnormalities but  images are forshortenend and endocardium not well visualized in other  views.   Coronary CTA 06/15/19 1. Coronary calcium score of 0. This was 0 percentile for age and sex matched control. 2. Normal coronary origin with right dominance.  Ramus branch noted. 3. No evidence of CAD. 4. There is left ventricular hypertrophy (20 mm septum and 16 mm lateral wall). 5. Non ischemic cardiomyopathy, probably hypertensive in etiology.  ASSESSMENT AND PLAN:  1. Chronic  systolic heart failure due to nonischemic cardiomyopathy: Currently on optimal medical therapy with Entresto, Aldactone, carvedilol. Repeat echo shows an ejection fraction of 30 to 35%. ***  2. Hypertension:***   Current medicines are reviewed at length with the patient today.   The patient does not have concerns regarding his medicines.  The following changes were made today:  ***  Labs/ tests ordered today include:  No orders of the defined types were placed in this encounter.    Disposition:   FU with Tearia Gibbs *** months  Signed, Mazelle Huebert Jorja Loa, MD  01/25/2020 8:25 AM     [facility] [address] [place] [address] [phone] (office) [phone] (fax)

## 2020-02-17 ENCOUNTER — Emergency Department (HOSPITAL_BASED_OUTPATIENT_CLINIC_OR_DEPARTMENT_OTHER)
Admission: EM | Admit: 2020-02-17 | Discharge: 2020-02-17 | Disposition: A | Discharge status: Home / Self Care | Payer: 59 | Attending: Emergency Medicine | Admitting: Emergency Medicine

## 2020-02-17 ENCOUNTER — Other Ambulatory Visit: Payer: Self-pay

## 2020-02-17 ENCOUNTER — Encounter (HOSPITAL_BASED_OUTPATIENT_CLINIC_OR_DEPARTMENT_OTHER): Payer: Self-pay | Admitting: Emergency Medicine

## 2020-02-17 DIAGNOSIS — R6883 Chills (without fever): Secondary | ICD-10-CM | POA: Insufficient documentation

## 2020-02-17 DIAGNOSIS — Z20822 Contact with and (suspected) exposure to covid-19: Secondary | ICD-10-CM | POA: Insufficient documentation

## 2020-02-17 DIAGNOSIS — R519 Headache, unspecified: Secondary | ICD-10-CM | POA: Diagnosis present

## 2020-02-17 DIAGNOSIS — Z79899 Other long term (current) drug therapy: Secondary | ICD-10-CM | POA: Insufficient documentation

## 2020-02-17 DIAGNOSIS — Z91010 Allergy to peanuts: Secondary | ICD-10-CM | POA: Diagnosis not present

## 2020-02-17 DIAGNOSIS — I110 Hypertensive heart disease with heart failure: Secondary | ICD-10-CM | POA: Insufficient documentation

## 2020-02-17 DIAGNOSIS — I509 Heart failure, unspecified: Secondary | ICD-10-CM | POA: Insufficient documentation

## 2020-02-17 DIAGNOSIS — R6889 Other general symptoms and signs: Secondary | ICD-10-CM

## 2020-02-17 LAB — RESP PANEL BY RT-PCR (FLU A&B, COVID) ARPGX2
Influenza A by PCR: NEGATIVE
Influenza B by PCR: NEGATIVE
SARS Coronavirus 2 by RT PCR: NEGATIVE

## 2020-02-17 NOTE — Discharge Instructions (Signed)
Testing negative for Covid and influenza.  Test results provided.  Recommend symptomatic treatment Tylenol for fever Motrin for headache or body aches.  Then over-the-counter cold and flu medicine if he goes into respiratory symptoms.  Return for any new or worse symptoms.

## 2020-02-17 NOTE — ED Notes (Signed)
ED Provider at bedside. 

## 2020-02-17 NOTE — ED Triage Notes (Signed)
Headache, bodyaches since last night.

## 2020-02-17 NOTE — ED Provider Notes (Signed)
[facility] EMERGENCY DEPARTMENT Provider Note   CSN: [id number] Arrival date & time: 02/17/20  1406     History Chief Complaint  Patient presents with  . Headache    Jeffery Glenn is a 49 y.o. male.  Patient with onset of headache chills body aches last night no upper respiratory symptoms.  No nausea no vomiting or diarrhea.  Patient's headache is diffuse not severe.  Here no fevers.  Oxygen saturation 97%.  Patient without any known Covid exposures        Past Medical History:  Diagnosis Date  . Atypical chest pain 05/17/2018  . Cardiomyopathy (HCC) ejection fraction 25 to 30% in February 2020, improvement to 30 to 35% in January 2021 06/09/2018   Ejection fraction 25 to 30% on echocardiogram from February 2020  . Congestive heart failure (CHF) (HCC) 05/06/2018  . Dyspnea on exertion 05/17/2018  . Essential hypertension 05/17/2018  . Hypertension     Patient Active Problem List   Diagnosis Date Noted  . Hypertension   . Cardiomyopathy (HCC) ejection fraction 25 to 30% in February 2020, improvement to 30 to 35% in January 2021 06/09/2018  . Dyspnea on exertion 05/17/2018  . Atypical chest pain 05/17/2018  . Essential hypertension 05/17/2018  . Congestive heart failure (CHF) (HCC) 05/06/2018    Past Surgical History:  Procedure Laterality Date  . HAND SURGERY    . NASAL SEPTUM SURGERY         Family History  Problem Relation Age of Onset  . Heart attack Mother   . Hypertension Mother   . Heart attack Father   . Hypertension Father     Social History   Tobacco Use  . Smoking status: Never Smoker  . Smokeless tobacco: Never Used  Vaping Use  . Vaping Use: Never used  Substance Use Topics  . Alcohol use: Yes  . Drug use: No    Home Medications Prior to Admission medications   Medication Sig Start Date End Date Taking? Authorizing Provider  amLODipine (NORVASC) 10 MG tablet Take 10 mg by mouth daily. 03/28/19   [provider]   atorvastatin (LIPITOR) 20 MG tablet Take 20 mg by mouth daily.    [provider]  carvedilol (COREG) 12.5 MG tablet TAKE 1 TABLET(12.5 MG) BY MOUTH TWICE DAILY 01/17/20   Georgeanna Lea, MD  carvedilol (COREG) 25 MG tablet Take 1 tablet (25 mg total) by mouth 2 (two) times daily. 03/07/19   Georgeanna Lea, MD  ENTRESTO 97-103 MG TAKE 1 TABLET BY MOUTH TWICE DAILY 03/28/19   Georgeanna Lea, MD  fluticasone [facility]) 50 MCG/ACT nasal spray Place 1-2 sprays into both nostrils as needed. 11/09/18   [provider]  furosemide (LASIX) 40 MG tablet Take 40 mg by mouth daily.    [provider]  loratadine (CLARITIN) 10 MG tablet Take 10 mg by mouth daily. 02/27/19   [provider]  spironolactone (ALDACTONE) 25 MG tablet Take 0.5 tablets (12.5 mg total) by mouth daily. 01/23/19   Alver Sorrow, NP    Allergies    Shellfish allergy and Peanut-containing drug products  Review of Systems   Review of Systems  Constitutional: Positive for chills. Negative for fever.  HENT: Negative for congestion, rhinorrhea and sore throat.   Eyes: Negative for visual disturbance.  Respiratory: Negative for cough and shortness of breath.   Cardiovascular: Negative for chest pain and leg swelling.  Gastrointestinal: Negative for abdominal pain, diarrhea, nausea and  vomiting.  Genitourinary: Negative for dysuria.  Musculoskeletal: Positive for myalgias. Negative for back pain and neck pain.  Skin: Negative for rash.  Neurological: Positive for headaches. Negative for dizziness and light-headedness.  Hematological: Does not bruise/bleed easily.  Psychiatric/Behavioral: Negative for confusion.    Physical Exam Updated Vital Signs BP (!) 163/99 (BP Location: Left Arm)   Pulse 80   Temp 98.2 F (36.8 C) (Oral)   Resp 18   Ht 1.829 m (6')   Wt 97.5 kg   SpO2 97%   BMI 29.16 kg/m   Physical Exam Vitals and nursing note reviewed.  Constitutional:       General: He is not in acute distress.    Appearance: Normal appearance. He is well-developed and well-nourished. He is not ill-appearing.  HENT:     Head: Normocephalic and atraumatic.  Eyes:     Extraocular Movements: Extraocular movements intact.     Conjunctiva/sclera: Conjunctivae normal.     Pupils: Pupils are equal, round, and reactive to light.  Cardiovascular:     Rate and Rhythm: Normal rate and regular rhythm.     Heart sounds: No murmur heard.   Pulmonary:     Effort: Pulmonary effort is normal. No respiratory distress.     Breath sounds: Normal breath sounds.  Abdominal:     Palpations: Abdomen is soft.     Tenderness: There is no abdominal tenderness.  Musculoskeletal:        General: No edema. Normal range of motion.     Cervical back: Normal range of motion and neck supple.  Skin:    General: Skin is warm and dry.     Capillary Refill: Capillary refill takes less than 2 seconds.  Neurological:     General: No focal deficit present.     Mental Status: He is alert and oriented to person, place, and time.     Cranial Nerves: No cranial nerve deficit.     Sensory: No sensory deficit.  Psychiatric:        Mood and Affect: Mood and affect normal.     ED Results / Procedures / Treatments   Labs (all labs ordered are listed, but only abnormal results are displayed) Labs Reviewed  RESP PANEL BY RT-PCR (FLU A&B, COVID) [id number]    EKG None  Radiology No results found.  Procedures Procedures (including critical care time)  Medications Ordered in ED Medications - No data to display  ED Course  I have reviewed the triage vital signs and the nursing notes.  Pertinent labs & imaging results that were available during my care of the patient were reviewed by me and considered in my medical decision making (see chart for details).    MDM Rules/Calculators/A&P                          Patient nontoxic no acute distress.  Covid testing influenza testing  negative.  Patient certainly listing viral-like symptoms flulike in nature.  Will treat symptomatically.  Work note provided till Monday.  Copy of his lab results provided.  Patient will return for any new or worse symptoms     Final Clinical Impression(s) / ED Diagnoses Final diagnoses:  Flu-like symptoms    Rx / DC Orders ED Discharge Orders    None       Vanetta Mulders, MD 02/17/20 1552

## 2020-03-04 ENCOUNTER — Other Ambulatory Visit: Payer: Self-pay

## 2020-03-05 ENCOUNTER — Ambulatory Visit (INDEPENDENT_AMBULATORY_CARE_PROVIDER_SITE_OTHER): Payer: 59 | Admitting: Cardiology

## 2020-03-05 ENCOUNTER — Encounter: Payer: Self-pay | Admitting: Cardiology

## 2020-03-05 ENCOUNTER — Other Ambulatory Visit: Payer: Self-pay

## 2020-03-05 VITALS — BP 144/90 | HR 90 | Ht 72.0 in | Wt 233.0 lb

## 2020-03-05 DIAGNOSIS — I5042 Chronic combined systolic (congestive) and diastolic (congestive) heart failure: Secondary | ICD-10-CM | POA: Diagnosis not present

## 2020-03-05 DIAGNOSIS — I10 Essential (primary) hypertension: Secondary | ICD-10-CM | POA: Diagnosis not present

## 2020-03-05 DIAGNOSIS — I420 Dilated cardiomyopathy: Secondary | ICD-10-CM | POA: Diagnosis not present

## 2020-03-05 DIAGNOSIS — R0600 Dyspnea, unspecified: Secondary | ICD-10-CM | POA: Diagnosis not present

## 2020-03-05 DIAGNOSIS — R0609 Other forms of dyspnea: Secondary | ICD-10-CM

## 2020-03-05 MED ORDER — ATORVASTATIN CALCIUM 20 MG PO TABS: 20.0000 mg | ORAL_TABLET | Freq: Every day | ORAL | 3 refills | Status: DC

## 2020-03-05 MED ORDER — CARVEDILOL 25 MG PO TABS: 25.0000 mg | ORAL_TABLET | Freq: Two times a day (BID) | ORAL | 3 refills | Status: DC

## 2020-03-05 MED ORDER — SPIRONOLACTONE 25 MG PO TABS: 12.5000 mg | ORAL_TABLET | Freq: Every day | ORAL | 3 refills | Status: DC

## 2020-03-05 MED ORDER — ENTRESTO 97-103 MG PO TABS: 1.0000 | ORAL_TABLET | Freq: Two times a day (BID) | ORAL | 3 refills | Status: DC

## 2020-03-05 MED ORDER — FUROSEMIDE 40 MG PO TABS: 40.0000 mg | ORAL_TABLET | Freq: Every day | ORAL | 3 refills | Status: DC

## 2020-03-05 MED ORDER — AMLODIPINE BESYLATE 10 MG PO TABS: 10.0000 mg | ORAL_TABLET | Freq: Every day | ORAL | 3 refills | Status: DC

## 2020-03-05 MED ORDER — ATORVASTATIN CALCIUM 20 MG PO TABS: 20.0000 mg | ORAL_TABLET | Freq: Every day | ORAL | 3 refills | Status: AC
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
## 2020-03-05 NOTE — Addendum Note (Signed)
Addended by: Delorse Limber I on: 03/05/2020 11:34 AM   Modules accepted: Orders

## 2020-03-05 NOTE — Addendum Note (Signed)
Addended by: Delorse Limber I on: 03/05/2020 11:27 AM   Modules accepted: Orders

## 2020-03-05 NOTE — Patient Instructions (Signed)
Medication Instructions:  Your physician recommends that you continue on your current medications as directed. Please refer to the Current Medication list given to you today.  PLEASE TAKE HALF OF YOUR NORVASC, LASIX, COREG, AND ENTRESTO FOR THE FIRST WEEK OF STARTING BACK. THEN TAKE THE FULL DOSAGE AFTER THAT IF YOU ARE NOT HAVING ANY ISSUES.  *If you need a refill on your cardiac medications before your next appointment, please call your pharmacy*   Lab Work: None If you have labs (blood work) drawn today and your tests are completely normal, you will receive your results only by:  MyChart Message (if you have MyChart) OR  A paper copy in the mail If you have any lab test that is abnormal or we need to change your treatment, we will call you to review the results.   Testing/Procedures: None   Follow-Up: At [facility], you and your health needs are our priority.  As part of our continuing mission to provide you with exceptional heart care, we have created designated Provider Care Teams.  These Care Teams include your primary Cardiologist (physician) and Advanced Practice Providers (APPs -  Physician Assistants and Nurse Practitioners) who all work together to provide you with the care you need, when you need it.  We recommend signing up for the patient portal called "MyChart".  Sign up information is provided on this After Visit Summary.  MyChart is used to connect with patients for Virtual Visits (Telemedicine).  Patients are able to view lab/test results, encounter notes, upcoming appointments, etc.  Non-urgent messages can be sent to your provider as well.   To learn more about what you can do with MyChart, go to [email or web address].    Your next appointment:   6 week(s)  The format for your next appointment:   In Person  Provider:   Gypsy Balsam, MD   Other Instructions

## 2020-03-06 ENCOUNTER — Telehealth: Payer: Self-pay

## 2020-03-06 NOTE — Telephone Encounter (Signed)
Spoke with the patient and he confirms he want to have all refill sent to [facility] on [address] in [place]. I called the pharmacy and called in Entresto 97-103mg  bid 180Qty w/ 3 rfs, as the pharmacy tech indicates they have not received any refill recently sent.

## 2020-03-06 NOTE — Telephone Encounter (Signed)
Per [employer] stated the patient is no longer eligible as of 10/08/2019. There're unable to process the prior auth request.

## 2020-03-13 ENCOUNTER — Other Ambulatory Visit: Payer: Self-pay

## 2020-03-13 ENCOUNTER — Encounter (HOSPITAL_BASED_OUTPATIENT_CLINIC_OR_DEPARTMENT_OTHER): Payer: Self-pay

## 2020-03-13 ENCOUNTER — Emergency Department (HOSPITAL_BASED_OUTPATIENT_CLINIC_OR_DEPARTMENT_OTHER)
Admission: EM | Admit: 2020-03-13 | Discharge: 2020-03-13 | Disposition: A | Discharge status: Home / Self Care | Payer: 59 | Attending: Emergency Medicine | Admitting: Emergency Medicine

## 2020-03-13 DIAGNOSIS — Z79899 Other long term (current) drug therapy: Secondary | ICD-10-CM | POA: Diagnosis not present

## 2020-03-13 DIAGNOSIS — U071 COVID-19: Secondary | ICD-10-CM | POA: Diagnosis not present

## 2020-03-13 DIAGNOSIS — I509 Heart failure, unspecified: Secondary | ICD-10-CM | POA: Insufficient documentation

## 2020-03-13 DIAGNOSIS — Z20822 Contact with and (suspected) exposure to covid-19: Secondary | ICD-10-CM

## 2020-03-13 DIAGNOSIS — Z91010 Allergy to peanuts: Secondary | ICD-10-CM | POA: Diagnosis not present

## 2020-03-13 DIAGNOSIS — R059 Cough, unspecified: Secondary | ICD-10-CM | POA: Diagnosis present

## 2020-03-13 DIAGNOSIS — I110 Hypertensive heart disease with heart failure: Secondary | ICD-10-CM | POA: Insufficient documentation

## 2020-03-13 LAB — SARS CORONAVIRUS 2 (TAT 6-24 HRS): SARS Coronavirus 2: POSITIVE — AB

## 2020-03-13 NOTE — ED Provider Notes (Signed)
[name] EMERGENCY DEPARTMENT Provider Note   CSN: [id number] Arrival date & time: 03/13/20  2694     History Chief Complaint  Patient presents with  . Covid Exposure    Jeffery Glenn is a 50 y.o. male.  HPI   Patient with significant medical history of cardiomyopathy, hypertension presents to the emergency department with chief complaint of URI-like symptoms. Patient states symptoms started on Friday but they have improved, wanted to be tested for Covid.  He endorses fevers, chills, nasal congestion, productive cough, but denies chest pain or shortness of breath.  He endorses that his wife tested positive for COVID-19 on friday and he has been in close contact with her, he is not vaccine against COVID-19 and is not immunocompromised.  patient denies chest pain, shortness of breath, urinary symptoms, diarrhea, constipation, pedal edema.  Past Medical History:  Diagnosis Date  . Atypical chest pain 05/17/2018  . Cardiomyopathy ([place]) ejection fraction 25 to 30% in February 2020, improvement to 30 to 35% in January 2021 06/09/2018   Ejection fraction 25 to 30% on echocardiogram from February 2020  . Congestive heart failure (CHF) ([place]) 05/06/2018  . Dyspnea on exertion 05/17/2018  . Essential hypertension 05/17/2018  . Hypertension     Patient Active Problem List   Diagnosis Date Noted  . Hypertension   . Cardiomyopathy ([place]) ejection fraction 25 to 30% in February 2020, improvement to 30 to 35% in January 2021 06/09/2018  . Dyspnea on exertion 05/17/2018  . Atypical chest pain 05/17/2018  . Essential hypertension 05/17/2018  . Congestive heart failure (CHF) ([place]) 05/06/2018    Past Surgical History:  Procedure Laterality Date  . HAND SURGERY    . NASAL SEPTUM SURGERY         Family History  Problem Relation Age of Onset  . Heart attack Mother   . Hypertension Mother   . Heart attack Father   . Hypertension Father     Social History   Tobacco Use  .  Smoking status: Never Smoker  . Smokeless tobacco: Never Used  Vaping Use  . Vaping Use: Never used  Substance Use Topics  . Alcohol use: Yes  . Drug use: No    Home Medications Prior to Admission medications   Medication Sig Start Date End Date Taking? Authorizing Provider  amLODipine (NORVASC) 10 MG tablet Take 1 tablet (10 mg total) by mouth daily. 03/05/20   Park Liter, MD  atorvastatin (LIPITOR) 20 MG tablet Take 1 tablet (20 mg total) by mouth daily. 03/05/20   Park Liter, MD  carvedilol (COREG) 25 MG tablet Take 1 tablet (25 mg total) by mouth 2 (two) times daily. 03/05/20   Park Liter, MD  fluticasone (FLONASE) 50 MCG/ACT nasal spray Place 1-2 sprays into both nostrils as needed. 11/09/18   [provider]  furosemide (LASIX) 40 MG tablet Take 1 tablet (40 mg total) by mouth daily. 03/05/20   Park Liter, MD  ketoconazole (NIZORAL) 2 % shampoo Apply 1 application topically as directed. 01/17/20   [provider]  loratadine (CLARITIN) 10 MG tablet Take 10 mg by mouth daily. 02/27/19   [provider]  sacubitril-valsartan (ENTRESTO) 97-103 MG Take 1 tablet by mouth 2 (two) times daily. 03/05/20   Park Liter, MD  spironolactone (ALDACTONE) 25 MG tablet Take 0.5 tablets (12.5 mg total) by mouth daily. 03/05/20   Park Liter, MD    Allergies    Shellfish allergy and  Peanut-containing drug products  Review of Systems   Review of Systems  Constitutional: Positive for chills and fever.  HENT: Positive for congestion. Negative for sore throat.   Eyes: Negative for visual disturbance.  Respiratory: Positive for cough. Negative for shortness of breath.   Cardiovascular: Negative for chest pain.  Gastrointestinal: Negative for abdominal pain, diarrhea, nausea and vomiting.  Genitourinary: Negative for enuresis.  Musculoskeletal: Negative for myalgias.  Skin: Negative for rash.  Neurological: Negative for  headaches.    Physical Exam Updated Vital Signs BP (S) (!) 183/116 (BP Location: Right Arm) Comment: pt reports he has not had his BP Medicaitons yet today.  Pulse 88   Temp 98 F (36.7 C) (Oral)   Resp 16   Ht 6' (1.829 m)   Wt 104.3 kg   SpO2 97%   BMI 31.19 kg/m   Physical Exam Vitals and nursing note reviewed.  Constitutional:      General: He is not in acute distress.    Appearance: He is not ill-appearing.  HENT:     Head: Normocephalic and atraumatic.     Nose: Congestion present.  Eyes:     Conjunctiva/sclera: Conjunctivae normal.  Cardiovascular:     Rate and Rhythm: Normal rate.  Pulmonary:     Effort: Pulmonary effort is normal.  Musculoskeletal:     Comments: Patient is moving all 4 extremities at difficulty.   Skin:    General: Skin is warm and dry.  Neurological:     Mental Status: He is alert.  Psychiatric:        Mood and Affect: Mood normal.     ED Results / Procedures / Treatments   Labs (all labs ordered are listed, but only abnormal results are displayed) Labs Reviewed  SARS CORONAVIRUS 2 (TAT 6-24 HRS)    EKG None  Radiology No results found.  Procedures Procedures (including critical care time)  Medications Ordered in ED Medications - No data to display  ED Course  I have reviewed the triage vital signs and the nursing notes.  Pertinent labs & imaging results that were available during my care of the patient were reviewed by me and considered in my medical decision making (see chart for details).    MDM Rules/Calculators/A&P                          Patient presents with URI-like symptoms.  She is alert, does not appear acute distress, vital signs reassuring.  Will obtain respiratory panel for further evaluation.  Respiratory panel pending at this time.  Low suspicion for systemic infection as patient is nontoxic-appearing, vital signs reassuring, no obvious source infection noted on exam.  Low suspicion for pneumonia as  patient is nontoxic-appearing, having no respiratory distress.  Will defer x-ray at this time. I have low suspicion for PE as patient denies pleuritic chest pain, shortness of breath, patient is PERC. low suspicion for strep throat as patient denies sore throat. low suspicion patient would need  hospitalized due to viral infection or Covid as vital signs reassuring, patient is not in respiratory distress.  patient has significant medical history of cardiomyopathy and hypertension will contact infusion clinic if patient is Covid positive.  Will recommend over-the-counter pain medications, self quarantine and post Covid care if Covid positive  Vital signs have remained stable, no indication for hospital admission. Patient given at home care as well strict return precautions.  Patient verbalized that they understood agreed to  said plan. Final Clinical Impression(s) / ED Diagnoses Final diagnoses:  Exposure to confirmed case of COVID-19    Rx / DC Orders ED Discharge Orders    None       Barnie Del 03/13/20 2004    Maia Plan, MD 03/15/20 [id number]

## 2020-03-13 NOTE — Discharge Instructions (Signed)
You have been seen here for URI like symptoms.  I recommend taking Tylenol for fever control and ibuprofen for pain control please follow dosing on the back of bottle.  I recommend staying hydrated and if you do not an appetite, I recommend soups as this will provide you with fluids and calories.  Your Covid test is pending I recommend self quarantine until you get your results back on MyChart.  If you are Covid positive you must self quarantine for 5 days starting on symptom onset.  If symptoms improve and you are feeling better you may return back to work/school.  If at the end of 5 days you continue to have symptoms you must self quarantine for additional 5 days.     I would like you to contact "post Covid care" if you are Covid positive as they will provide you with information how to manage your Covid symptoms.  If the children need to have a Covid test performed and they have mild symptoms they can be swabbed at a pharmacy like [facility] and/or [facility]   Come back to the emergency department if you develop chest pain, shortness of breath, severe abdominal pain, uncontrolled nausea, vomiting, diarrhea.

## 2020-03-13 NOTE — ED Triage Notes (Signed)
Pt arrives with c/o cough, throat pain, and fatigue, wife has tested positive for Covid.

## 2020-03-13 NOTE — ED Notes (Signed)
States his wife had a positive test on Friday and he has s/s of cough and cold wants all kids tested that were exposed to his wife

## 2020-04-06 ENCOUNTER — Emergency Department (HOSPITAL_BASED_OUTPATIENT_CLINIC_OR_DEPARTMENT_OTHER): Payer: 59

## 2020-04-06 ENCOUNTER — Emergency Department (HOSPITAL_BASED_OUTPATIENT_CLINIC_OR_DEPARTMENT_OTHER)
Admission: EM | Admit: 2020-04-06 | Discharge: 2020-04-06 | Disposition: A | Discharge status: Home / Self Care | Payer: 59 | Attending: Emergency Medicine | Admitting: Emergency Medicine

## 2020-04-06 ENCOUNTER — Other Ambulatory Visit: Payer: Self-pay

## 2020-04-06 ENCOUNTER — Encounter (HOSPITAL_BASED_OUTPATIENT_CLINIC_OR_DEPARTMENT_OTHER): Payer: Self-pay | Admitting: Emergency Medicine

## 2020-04-06 DIAGNOSIS — R059 Cough, unspecified: Secondary | ICD-10-CM | POA: Diagnosis present

## 2020-04-06 DIAGNOSIS — I110 Hypertensive heart disease with heart failure: Secondary | ICD-10-CM | POA: Diagnosis not present

## 2020-04-06 DIAGNOSIS — Z79899 Other long term (current) drug therapy: Secondary | ICD-10-CM | POA: Diagnosis not present

## 2020-04-06 DIAGNOSIS — Z91010 Allergy to peanuts: Secondary | ICD-10-CM | POA: Diagnosis not present

## 2020-04-06 DIAGNOSIS — I509 Heart failure, unspecified: Secondary | ICD-10-CM | POA: Diagnosis not present

## 2020-04-06 DIAGNOSIS — I10 Essential (primary) hypertension: Secondary | ICD-10-CM

## 2020-04-06 LAB — BRAIN NATRIURETIC PEPTIDE: B Natriuretic Peptide: 524.3 pg/mL — ABNORMAL HIGH (ref 0.0–100.0)

## 2020-04-06 LAB — CBC WITH DIFFERENTIAL/PLATELET
Abs Immature Granulocytes: 0 10*3/uL (ref 0.00–0.07)
Basophils Absolute: 0 10*3/uL (ref 0.0–0.1)
Basophils Relative: 0 %
Eosinophils Absolute: 0.2 10*3/uL (ref 0.0–0.5)
Eosinophils Relative: 4 %
HCT: 42 % (ref 39.0–52.0)
Hemoglobin: 13.6 g/dL (ref 13.0–17.0)
Immature Granulocytes: 0 %
Lymphocytes Relative: 39 %
Lymphs Abs: 2.1 10*3/uL (ref 0.7–4.0)
MCH: 26.9 pg (ref 26.0–34.0)
MCHC: 32.4 g/dL (ref 30.0–36.0)
MCV: 83.2 fL (ref 80.0–100.0)
Monocytes Absolute: 0.6 10*3/uL (ref 0.1–1.0)
Monocytes Relative: 11 %
Neutro Abs: 2.4 10*3/uL (ref 1.7–7.7)
Neutrophils Relative %: 46 %
Platelets: 257 10*3/uL (ref 150–400)
RBC: 5.05 MIL/uL (ref 4.22–5.81)
RDW: 15.6 % — ABNORMAL HIGH (ref 11.5–15.5)
Smear Review: NORMAL
WBC: 5.3 10*3/uL (ref 4.0–10.5)
nRBC: 0 % (ref 0.0–0.2)

## 2020-04-06 LAB — BASIC METABOLIC PANEL
Anion gap: 9 (ref 5–15)
BUN: 12 mg/dL (ref 6–20)
CO2: 27 mmol/L (ref 22–32)
Calcium: 9 mg/dL (ref 8.9–10.3)
Chloride: 101 mmol/L (ref 98–111)
Creatinine, Ser: 0.87 mg/dL (ref 0.61–1.24)
GFR, Estimated: >60 mL/min (ref >60)
Glucose, Bld: 142 mg/dL — ABNORMAL HIGH (ref 70–99)
Potassium: 4.3 mmol/L (ref 3.5–5.1)
Sodium: 137 mmol/L (ref 135–145)

## 2020-04-06 MED ORDER — AMLODIPINE BESYLATE 5 MG PO TABS
10.0000 mg | ORAL_TABLET | Freq: Once | ORAL | Status: AC
  Administered 2020-04-06: 10 mg via ORAL
  Filled 2020-04-06: qty 2

## 2020-04-06 MED ORDER — CARVEDILOL 12.5 MG PO TABS
25.0000 mg | ORAL_TABLET | Freq: Two times a day (BID) | ORAL | Status: DC
  Administered 2020-04-06: 25 mg via ORAL
  Filled 2020-04-06 (×2): qty 2

## 2020-04-06 MED ORDER — FUROSEMIDE 10 MG/ML IJ SOLN
60.0000 mg | Freq: Once | INTRAMUSCULAR | Status: AC
  Administered 2020-04-06: 60 mg via INTRAVENOUS
  Filled 2020-04-06: qty 6

## 2020-04-06 NOTE — ED Triage Notes (Signed)
Pt arrives pov with cough and sore throat x 2 weeks. Pt was Covid + 03/13/20

## 2020-04-06 NOTE — ED Provider Notes (Signed)
[facility] EMERGENCY DEPARTMENT Provider Note   CSN: [id number] Arrival date & time: 04/06/20  1740     History Chief Complaint  Patient presents with  . Cough    Jeffery Glenn is a 50 y.o. male.  50 year old male with history of CHF (EF 25% on 1/21 echo), HTN (not compliant with medications), with complaint of cough productive of mucous (yellow/brown) and sore throat x 2 weeks (after diagnosis of COVID on 03/13/20), also states he is now wheezing. Patient also reports orthopnea, denies DOE, CP, lower extremity edema, fevers, vomiting, changes in bowel habits. No other complaints or concerns.         Past Medical History:  Diagnosis Date  . Atypical chest pain 05/17/2018  . Cardiomyopathy (HCC) ejection fraction 25 to 30% in February 2020, improvement to 30 to 35% in January 2021 06/09/2018   Ejection fraction 25 to 30% on echocardiogram from February 2020  . Congestive heart failure (CHF) (HCC) 05/06/2018  . Dyspnea on exertion 05/17/2018  . Essential hypertension 05/17/2018  . Hypertension     Patient Active Problem List   Diagnosis Date Noted  . Hypertension   . Cardiomyopathy (HCC) ejection fraction 25 to 30% in February 2020, improvement to 30 to 35% in January 2021 06/09/2018  . Dyspnea on exertion 05/17/2018  . Atypical chest pain 05/17/2018  . Essential hypertension 05/17/2018  . Congestive heart failure (CHF) (HCC) 05/06/2018    Past Surgical History:  Procedure Laterality Date  . HAND SURGERY    . NASAL SEPTUM SURGERY         Family History  Problem Relation Age of Onset  . Heart attack Mother   . Hypertension Mother   . Heart attack Father   . Hypertension Father     Social History   Tobacco Use  . Smoking status: Never Smoker  . Smokeless tobacco: Never Used  Vaping Use  . Vaping Use: Never used  Substance Use Topics  . Alcohol use: Yes  . Drug use: No    Home Medications Prior to Admission medications   Medication Sig Start Date  End Date Taking? Authorizing Provider  amLODipine (NORVASC) 10 MG tablet Take 1 tablet (10 mg total) by mouth daily. 03/05/20   Georgeanna Lea, MD  atorvastatin (LIPITOR) 20 MG tablet Take 1 tablet (20 mg total) by mouth daily. 03/05/20   Georgeanna Lea, MD  carvedilol (COREG) 25 MG tablet Take 1 tablet (25 mg total) by mouth 2 (two) times daily. 03/05/20   Georgeanna Lea, MD  fluticasone (FLONASE) 50 MCG/ACT nasal spray Place 1-2 sprays into both nostrils as needed. 11/09/18   [provider]  furosemide (LASIX) 40 MG tablet Take 1 tablet (40 mg total) by mouth daily. 03/05/20   Georgeanna Lea, MD  ketoconazole (NIZORAL) 2 % shampoo Apply 1 application topically as directed. 01/17/20   [provider]  loratadine (CLARITIN) 10 MG tablet Take 10 mg by mouth daily. 02/27/19   [provider]  sacubitril-valsartan (ENTRESTO) 97-103 MG Take 1 tablet by mouth 2 (two) times daily. 03/05/20   Georgeanna Lea, MD  spironolactone (ALDACTONE) 25 MG tablet Take 0.5 tablets (12.5 mg total) by mouth daily. 03/05/20   Georgeanna Lea, MD    Allergies    Shellfish allergy and Peanut-containing drug products  Review of Systems   Review of Systems  Constitutional: Negative for chills, diaphoresis and fever.  HENT: Positive for sore throat. Negative for congestion.  Respiratory: Positive for cough, shortness of breath and wheezing.   Cardiovascular: Negative for chest pain and leg swelling.  Gastrointestinal: Negative for abdominal pain, constipation, diarrhea, nausea and vomiting.  Genitourinary: Negative for dysuria and frequency.  Musculoskeletal: Negative for arthralgias and myalgias.  Skin: Negative for rash and wound.  Allergic/Immunologic: Negative for immunocompromised state.  Neurological: Negative for weakness.  Hematological: Negative for adenopathy.  Psychiatric/Behavioral: Negative for confusion.  All other systems reviewed and are  negative.   Physical Exam Updated Vital Signs BP (!) 162/118   Pulse 93   Temp 97.7 F (36.5 C) (Oral)   Resp 15   Ht 6' (1.829 m)   Wt 97.5 kg   SpO2 98%   BMI 29.16 kg/m   Physical Exam Vitals and nursing note reviewed.  Constitutional:      General: He is not in acute distress.    Appearance: He is well-developed and well-nourished. He is obese. He is not diaphoretic.  HENT:     Head: Normocephalic and atraumatic.     Right Ear: Tympanic membrane and ear canal normal.     Left Ear: Tympanic membrane and ear canal normal.     Mouth/Throat:     Mouth: Mucous membranes are moist.     Pharynx: No oropharyngeal exudate or posterior oropharyngeal erythema.  Eyes:     Conjunctiva/sclera: Conjunctivae normal.  Cardiovascular:     Rate and Rhythm: Normal rate and regular rhythm.     Pulses: Normal pulses.     Heart sounds: Normal heart sounds.  Pulmonary:     Effort: Pulmonary effort is normal.     Breath sounds: Examination of the right-lower field reveals decreased breath sounds. Examination of the left-lower field reveals decreased breath sounds. Decreased breath sounds present. No wheezing.  Abdominal:     Palpations: Abdomen is soft.     Tenderness: There is no abdominal tenderness.  Musculoskeletal:     Cervical back: Neck supple.     Right lower leg: No edema.     Left lower leg: No edema.  Lymphadenopathy:     Cervical: No cervical adenopathy.  Skin:    General: Skin is warm and dry.     Findings: No erythema or rash.  Neurological:     Mental Status: He is alert and oriented to person, place, and time.  Psychiatric:        Mood and Affect: Mood and affect normal.        Behavior: Behavior normal.     ED Results / Procedures / Treatments   Labs (all labs ordered are listed, but only abnormal results are displayed) Labs Reviewed  BASIC METABOLIC PANEL - Abnormal; Notable for the following components:      Result Value   Glucose, Bld 142 (*)    All  other components within normal limits  CBC WITH DIFFERENTIAL/PLATELET - Abnormal; Notable for the following components:   RDW 15.6 (*)    All other components within normal limits  BRAIN NATRIURETIC PEPTIDE - Abnormal; Notable for the following components:   B Natriuretic Peptide 524.3 (*)    All other components within normal limits    EKG EKG Interpretation  Date/Time:  Saturday April 06 2020 17:54:45 EST Ventricular Rate:  94 PR Interval:    QRS Duration: 106 QT Interval:  399 QTC Calculation: 499 R Axis:   -77 Text Interpretation: Sinus rhythm LAE, consider biatrial enlargement Left anterior fascicular block Left ventricular hypertrophy ST elev, probable normal early repol  pattern Borderline prolonged QT interval Baseline wander in lead(s) V4 since last tracing no significant change Confirmed by Rolan Bucco [phone]) on 04/06/2020 5:57:19 PM   Radiology DG Chest 2 View  Result Date: 04/06/2020 CLINICAL DATA:  Persistent cough and shortness of breath. COVID positive 03/13/2020. EXAM: CHEST - 2 VIEW COMPARISON:  Radiograph 05/06/2018. FINDINGS: Chronic cardiomegaly, unchanged. Stable mediastinal contours. Minimal septal thickening consistent with mild pulmonary edema. There is mild scarring in the lingula and right middle lobe. No acute airspace disease. No pneumothorax or pleural effusion. No acute osseous abnormalities are seen. IMPRESSION: Stable cardiomegaly. Mild pulmonary edema but less than on prior exam. Electronically Signed   By: Narda Rutherford M.D.   On: 04/06/2020 18:36    Procedures Procedures   Medications Ordered in ED Medications  carvedilol (COREG) tablet 25 mg (25 mg Oral Given 04/06/20 1853)  amLODipine (NORVASC) tablet 10 mg (10 mg Oral Given 04/06/20 1852)  furosemide (LASIX) injection 60 mg (60 mg Intravenous Given 04/06/20 1925)    ED Course  I have reviewed the triage vital signs and the nursing notes.  Pertinent labs & imaging results that were  available during my care of the patient were reviewed by me and considered in my medical decision making (see chart for details).  Clinical Course as of 04/06/20 1954  Sat Apr 06, 2020  2835 50 year old male with history as above, hypertensive, has not been compliant with meds, states he just refilled meds and has all medications available.  CXR with pulmonary edema, improved from prior CXR. CBC and BMP WNL. BNP elevated from prior, currently 524.3. Patient was given home BP meds and dose of IV lasix, symptoms improving, BP somewhat improved. Discussed importance of medication compliance. Recommend recheck with PCP, return to ER as needed.  [LM]    Clinical Course User Index [LM] Alden Hipp   MDM Rules/Calculators/A&P                          Final Clinical Impression(s) / ED Diagnoses Final diagnoses:  Acute on chronic congestive heart failure, unspecified heart failure type (HCC)  Hypertension, unspecified type    Rx / DC Orders ED Discharge Orders    None       Jeannie Fend, PA-C 04/06/20 1954    Rolan Bucco, MD 04/06/20 [phone]

## 2020-04-06 NOTE — ED Notes (Signed)
Manual b/p of 130/92

## 2020-04-06 NOTE — Discharge Instructions (Addendum)
It is critical that you continue with your home medications as prescribed.  Follow up with your doctor for recheck, return to the ER for worsening or concerning symptoms.

## 2020-04-09 ENCOUNTER — Ambulatory Visit: Payer: Medicaid Other | Admitting: Cardiology

## 2020-04-23 ENCOUNTER — Encounter (HOSPITAL_BASED_OUTPATIENT_CLINIC_OR_DEPARTMENT_OTHER): Payer: Self-pay | Admitting: *Deleted

## 2020-04-23 ENCOUNTER — Emergency Department (HOSPITAL_BASED_OUTPATIENT_CLINIC_OR_DEPARTMENT_OTHER): Payer: 59

## 2020-04-23 ENCOUNTER — Emergency Department (HOSPITAL_BASED_OUTPATIENT_CLINIC_OR_DEPARTMENT_OTHER)
Admission: EM | Admit: 2020-04-23 | Discharge: 2020-04-23 | Discharge status: Against Medical Advice | Payer: 59 | Attending: Emergency Medicine | Admitting: Emergency Medicine

## 2020-04-23 ENCOUNTER — Other Ambulatory Visit: Payer: Self-pay

## 2020-04-23 DIAGNOSIS — I509 Heart failure, unspecified: Secondary | ICD-10-CM | POA: Diagnosis not present

## 2020-04-23 DIAGNOSIS — Z91010 Allergy to peanuts: Secondary | ICD-10-CM | POA: Diagnosis not present

## 2020-04-23 DIAGNOSIS — I161 Hypertensive emergency: Secondary | ICD-10-CM | POA: Diagnosis not present

## 2020-04-23 DIAGNOSIS — Z79899 Other long term (current) drug therapy: Secondary | ICD-10-CM | POA: Diagnosis not present

## 2020-04-23 DIAGNOSIS — R0789 Other chest pain: Secondary | ICD-10-CM | POA: Diagnosis present

## 2020-04-23 DIAGNOSIS — I110 Hypertensive heart disease with heart failure: Secondary | ICD-10-CM | POA: Diagnosis not present

## 2020-04-23 DIAGNOSIS — R079 Chest pain, unspecified: Secondary | ICD-10-CM

## 2020-04-23 LAB — CBC WITH DIFFERENTIAL/PLATELET
Abs Immature Granulocytes: 0.01 10*3/uL (ref 0.00–0.07)
Basophils Absolute: 0 10*3/uL (ref 0.0–0.1)
Basophils Relative: 1 %
Eosinophils Absolute: 0.1 10*3/uL (ref 0.0–0.5)
Eosinophils Relative: 3 %
HCT: 39.1 % (ref 39.0–52.0)
Hemoglobin: 12.8 g/dL — ABNORMAL LOW (ref 13.0–17.0)
Immature Granulocytes: 0 %
Lymphocytes Relative: 51 %
Lymphs Abs: 2.5 10*3/uL (ref 0.7–4.0)
MCH: 27.1 pg (ref 26.0–34.0)
MCHC: 32.7 g/dL (ref 30.0–36.0)
MCV: 82.7 fL (ref 80.0–100.0)
Monocytes Absolute: 0.5 10*3/uL (ref 0.1–1.0)
Monocytes Relative: 10 %
Neutro Abs: 1.7 10*3/uL (ref 1.7–7.7)
Neutrophils Relative %: 35 %
Platelets: 226 10*3/uL (ref 150–400)
RBC: 4.73 MIL/uL (ref 4.22–5.81)
RDW: 15.8 % — ABNORMAL HIGH (ref 11.5–15.5)
Smear Review: NORMAL
WBC: 4.9 10*3/uL (ref 4.0–10.5)
nRBC: 0 % (ref 0.0–0.2)

## 2020-04-23 LAB — BASIC METABOLIC PANEL
Anion gap: 10 (ref 5–15)
BUN: 16 mg/dL (ref 6–20)
CO2: 25 mmol/L (ref 22–32)
Calcium: 8.9 mg/dL (ref 8.9–10.3)
Chloride: 105 mmol/L (ref 98–111)
Creatinine, Ser: 1.13 mg/dL (ref 0.61–1.24)
GFR, Estimated: >60 mL/min (ref >60)
Glucose, Bld: 98 mg/dL (ref 70–99)
Potassium: 3.3 mmol/L — ABNORMAL LOW (ref 3.5–5.1)
Sodium: 140 mmol/L (ref 135–145)

## 2020-04-23 LAB — MAGNESIUM: Magnesium: 2 mg/dL (ref 1.7–2.4)

## 2020-04-23 LAB — BRAIN NATRIURETIC PEPTIDE: B Natriuretic Peptide: 495.6 pg/mL — ABNORMAL HIGH (ref 0.0–100.0)

## 2020-04-23 LAB — TROPONIN I (HIGH SENSITIVITY)
Troponin I (High Sensitivity): 25 ng/L — ABNORMAL HIGH (ref <18)
Troponin I (High Sensitivity): 29 ng/L — ABNORMAL HIGH (ref <18)

## 2020-04-23 MED ORDER — CARVEDILOL 12.5 MG PO TABS
25.0000 mg | ORAL_TABLET | Freq: Two times a day (BID) | ORAL | Status: DC
  Administered 2020-04-23: 25 mg via ORAL
  Filled 2020-04-23: qty 2

## 2020-04-23 MED ORDER — ASPIRIN 81 MG PO CHEW
324.0000 mg | CHEWABLE_TABLET | Freq: Once | ORAL | Status: AC
  Administered 2020-04-23: 324 mg via ORAL
  Filled 2020-04-23: qty 4

## 2020-04-23 MED ORDER — NITROGLYCERIN 0.4 MG SL SUBL
0.4000 mg | SUBLINGUAL_TABLET | SUBLINGUAL | Status: DC | PRN
  Administered 2020-04-23 (×2): 0.4 mg via SUBLINGUAL
  Filled 2020-04-23: qty 1

## 2020-04-23 MED ORDER — CARVEDILOL 12.5 MG PO TABS
ORAL_TABLET | ORAL | Status: AC
  Filled 2020-04-23: qty 1

## 2020-04-23 MED ORDER — HYDRALAZINE HCL 25 MG PO TABS
25.0000 mg | ORAL_TABLET | Freq: Once | ORAL | Status: AC
  Administered 2020-04-23: 25 mg via ORAL
  Filled 2020-04-23: qty 1

## 2020-04-23 MED ORDER — HYDRALAZINE HCL 25 MG PO TABS: 25.0000 mg | ORAL_TABLET | Freq: Three times a day (TID) | ORAL | 1 refills | Status: DC

## 2020-04-23 NOTE — ED Triage Notes (Addendum)
Pt c/o epigastric/ CP " pressure" , denies n/v , also c/o lesions on penis x 3 days

## 2020-04-23 NOTE — Discharge Instructions (Addendum)
Important Instructions*  You were seen in the ER today for chest pain.  Your workup was concerning for hypertensive emergency.  This means your blood pressure was too high and putting stress on your heart.  We recommended medical admission for IV medications and cardiology evaluation in the hospital.  You told me you could not stay tonight, and you signed out against medical advice.   I explained the risks of not treating your condition in the hospital, including worsening chest pain, worsening heart failure, and death.  I encouraged you to return to the hospital ER immediately if your chest pain returns or worsens.  Call your cardiologist office TOMORROW about a follow up appointment.  They should also try to reach out to your tomorrow.  *  In the meantime, I've added a new blood pressure pill called hydralazine.  You will take 25 mg three times per day - in the morning, afternoon, and at night.  You do not need to take any more medications tonight after leaving the ER.    Check your blood pressure with your cuff at home twice per day.  Record your daily blood pressures and bring it to the doctor's office with you.  If your top number in your blood pressure is less than 130 mmhg, you should SKIP the hydralazine medication and continue your normal medications.

## 2020-04-23 NOTE — ED Notes (Signed)
Pt on monitor and vitals cycling 

## 2020-04-23 NOTE — ED Provider Notes (Signed)
[facility] EMERGENCY DEPARTMENT Provider Note   CSN: [id number] Arrival date & time: 04/23/20  1550     History Chief Complaint  Patient presents with  . Chest Pain    Jeffery Glenn is a 50 y.o. male w/ hx of nonischemic cardiomyopathy (EF 30-35% in Jan 2021), HTN, dyspnea presenting to Ed with chest pressure.  He reports gradual onset of left sided substernal pressure around 0100 this morning while lying in bed watching TV.  The pain has been waxing and waning all day.  It is currently 6/10.  It does not radiate.  He's never had this pain before.  Nothing makes it better or worse.  He denies associated fevers, chills, diaphoresis, nausea, lightheadedness, numbness or weakness.  He denies new leg swelling, dyspnea, or orthopnea.  Denies hx of anginal symptoms.  Reports he has chronic dyspnea on exertion.  He denies hx of smoking, diabetes, HLD.  He is compliant with all medications including HTN medications and lasix.  He did take his morning doses.  He reports sig family hx of MI in his mother (age 57) and father (age 69).  Cardiologist is Dr Gypsy Bals[facility] at [facility], last seen in office on 03/05/20.  Per our records he has not had a cardiac cath in the past.   Last echo 01/16/20:  IMPRESSIONS    1. Left ventricular ejection fraction, by estimation, is 30 to 35%. The  left ventricle has moderately decreased function. The left ventricle  demonstrates global hypokinesis. The left ventricular internal cavity size  was moderately dilated. Left  ventricular diastolic parameters are consistent with Grade II diastolic  dysfunction (pseudonormalization).  2. Right ventricular systolic function is normal. The right ventricular  size is normal. Tricuspid regurgitation signal is inadequate for assessing  PA pressure.  3. Left atrial size was moderately dilated.  4. The mitral valve is normal in structure. Trivial mitral valve  regurgitation. No evidence of mitral  stenosis.  5. The aortic valve is tricuspid. Aortic valve regurgitation is not  visualized. Mild aortic valve sclerosis is present, with no evidence of  aortic valve stenosis.  6. The inferior vena cava is normal in size with greater than 50%  respiratory variability, suggesting right atrial pressure of 3 mmHg.  7. Recommend definity contrast limited study to assess for focal wall  motion abnormalities. Some of the views suggest focal abnormalities but  images are forshortenend and endocardium not well visualized in other  views.   HPI     Past Medical History:  Diagnosis Date  . Atypical chest pain 05/17/2018  . Cardiomyopathy (HCC) ejection fraction 25 to 30% in February 2020, improvement to 30 to 35% in January 2021 06/09/2018   Ejection fraction 25 to 30% on echocardiogram from February 2020  . Congestive heart failure (CHF) (HCC) 05/06/2018  . Dyspnea on exertion 05/17/2018  . Essential hypertension 05/17/2018  . Hypertension     Patient Active Problem List   Diagnosis Date Noted  . Hypertension   . Cardiomyopathy (HCC) ejection fraction 25 to 30% in February 2020, improvement to 30 to 35% in January 2021 06/09/2018  . Dyspnea on exertion 05/17/2018  . Atypical chest pain 05/17/2018  . Essential hypertension 05/17/2018  . Congestive heart failure (CHF) (HCC) 05/06/2018    Past Surgical History:  Procedure Laterality Date  . HAND SURGERY    . NASAL SEPTUM SURGERY         Family History  Problem Relation Age of Onset  . Heart  attack Mother   . Hypertension Mother   . Heart attack Father   . Hypertension Father     Social History   Tobacco Use  . Smoking status: Never Smoker  . Smokeless tobacco: Never Used  Vaping Use  . Vaping Use: Never used  Substance Use Topics  . Alcohol use: Yes  . Drug use: No    Home Medications Prior to Admission medications   Medication Sig Start Date End Date Taking? Authorizing Provider  hydrALAZINE (APRESOLINE) 25 MG  tablet Take 1 tablet (25 mg total) by mouth 3 (three) times daily. 04/23/20 05/23/20 Yes Linkin Vizzini, Kermit Balo, MD  amLODipine (NORVASC) 10 MG tablet Take 1 tablet (10 mg total) by mouth daily. 03/05/20   Georgeanna Lea, MD  atorvastatin (LIPITOR) 20 MG tablet Take 1 tablet (20 mg total) by mouth daily. 03/05/20   Georgeanna Lea, MD  carvedilol (COREG) 25 MG tablet Take 1 tablet (25 mg total) by mouth 2 (two) times daily. 03/05/20   Georgeanna Lea, MD  fluticasone (FLONASE) 50 MCG/ACT nasal spray Place 1-2 sprays into both nostrils as needed. 11/09/18   [provider]  furosemide (LASIX) 40 MG tablet Take 1 tablet (40 mg total) by mouth daily. 03/05/20   Georgeanna Lea, MD  ketoconazole (NIZORAL) 2 % shampoo Apply 1 application topically as directed. 01/17/20   [provider]  loratadine (CLARITIN) 10 MG tablet Take 10 mg by mouth daily. 02/27/19   [provider]  sacubitril-valsartan (ENTRESTO) 97-103 MG Take 1 tablet by mouth 2 (two) times daily. 03/05/20   Georgeanna Lea, MD  spironolactone (ALDACTONE) 25 MG tablet Take 0.5 tablets (12.5 mg total) by mouth daily. 03/05/20   Georgeanna Lea, MD    Allergies    Shellfish allergy and Peanut-containing drug products  Review of Systems   Review of Systems  Constitutional: Negative for chills and fever.  Eyes: Negative for pain and visual disturbance.  Respiratory: Positive for shortness of breath. Negative for cough.   Cardiovascular: Positive for chest pain. Negative for palpitations and leg swelling.  Gastrointestinal: Negative for abdominal pain and vomiting.  Genitourinary: Negative for dysuria and hematuria.  Musculoskeletal: Negative for arthralgias and back pain.  Skin: Negative for color change and rash.  Neurological: Negative for syncope, light-headedness and headaches.  All other systems reviewed and are negative.   Physical Exam Updated Vital Signs BP (!) 151/90   Pulse 75    Temp 97.8 F (36.6 C) (Oral)   Resp (!) 21   Ht 6' (1.829 m)   Wt 102.1 kg   SpO2 97%   BMI 30.52 kg/m   Physical Exam Constitutional:      General: He is not in acute distress.    Appearance: He is obese.  HENT:     Head: Normocephalic and atraumatic.  Eyes:     Conjunctiva/sclera: Conjunctivae normal.     Pupils: Pupils are equal, round, and reactive to light.  Cardiovascular:     Rate and Rhythm: Normal rate and regular rhythm.  Pulmonary:     Effort: Pulmonary effort is normal. No respiratory distress.  Abdominal:     General: There is no distension.     Tenderness: There is no abdominal tenderness.  Skin:    General: Skin is warm and dry.  Neurological:     General: No focal deficit present.     Mental Status: He is alert and oriented to person, place, and time. Mental status  is at baseline.  Psychiatric:        Mood and Affect: Mood normal.        Behavior: Behavior normal.     ED Results / Procedures / Treatments   Labs (all labs ordered are listed, but only abnormal results are displayed) Labs Reviewed  CBC WITH DIFFERENTIAL/PLATELET - Abnormal; Notable for the following components:      Result Value   RDW 15.9 (*)    All other components within normal limits  BASIC METABOLIC PANEL - Abnormal; Notable for the following components:   Potassium 3.3 (*)    All other components within normal limits  BRAIN NATRIURETIC PEPTIDE - Abnormal; Notable for the following components:   B Natriuretic Peptide 495.6 (*)    All other components within normal limits  CBC WITH DIFFERENTIAL/PLATELET - Abnormal; Notable for the following components:   Hemoglobin 12.8 (*)    RDW 15.8 (*)    All other components within normal limits  TROPONIN I (HIGH SENSITIVITY) - Abnormal; Notable for the following components:   Troponin I (High Sensitivity) 25 (*)    All other components within normal limits  TROPONIN I (HIGH SENSITIVITY) - Abnormal; Notable for the following components:    Troponin I (High Sensitivity) 29 (*)    All other components within normal limits  MAGNESIUM    EKG EKG Interpretation  Date/Time:  Tuesday April 23 2020 15:58:27 EST Ventricular Rate:  91 PR Interval:    QRS Duration: 111 QT Interval:  403 QTC Calculation: 496 R Axis:   -72 Text Interpretation: Sinus rhythm Left ventricular hypertrophy ST elev, probable normal early repol pattern Borderline prolonged QT interval No sig change from Apr 06 2020 ecg Confirmed by Alvester Chou [phone]) on 04/23/2020 4:19:47 PM   Radiology DG Chest Portable 1 View  Result Date: 04/23/2020 CLINICAL DATA:  Epigastric pain.  Chest pain and pressure. EXAM: PORTABLE CHEST 1 VIEW COMPARISON:  April 06, 2020 FINDINGS: Again noted is cardiomegaly. An underlying pericardial effusion is not excluded. No pneumothorax. No large pleural effusion. No acute focal infiltrate. No acute osseous abnormality. IMPRESSION: Persistent cardiomegaly. The configuration of the heart can be seen in patients with underlying pericardial effusion. Electronically Signed   By: Katherine Mantle M.D.   On: 04/23/2020 16:44      Medications Ordered in ED Medications  aspirin chewable tablet 324 mg (324 mg Oral Given 04/23/20 1644)  hydrALAZINE (APRESOLINE) tablet 25 mg (25 mg Oral Given 04/23/20 1841)    ED Course  I have reviewed the triage vital signs and the nursing notes.  Pertinent labs & imaging results that were available during my care of the patient were reviewed by me and considered in my medical decision making (see chart for details).  This patient presents to the Emergency Department with complaint of chest pain. This involves an extensive number of treatment options, and is a complaint that carries with it a high risk of complications and morbidity.  The differential diagnosis includes ACS vs Pneumothorax vs Reflux/Gastritis vs MSK pain vs Pneumonia vs other.  Lower clinical suspicion for PE with symptoms waxing  and waning, no pleuritic pain or SOB, no immediate PE risk factors.  I ordered, reviewed, and interpreted labs, including BMP and CBC.  There were no immediate, life-threatening emergencies found in this labwork.  The patient's troponin level was 25 -> 29.  BNP was 495.  Mg normal. I ordered medication aspirin and SL nitro for chest pain I ordered imaging studies  which included dg chest I independently visualized and interpreted imaging which showed cardiomegaly, no acute pulm process and the monitor tracing which showed sinus rhythm Additional history was obtained from DG chest Previous records obtained and reviewed showing outpatient workup, most recent echocardiogram I personally reviewed the patients ECG which showed sinus rhythm with no acute ischemic findings  I consulted cardiology and discussed lab and imaging findings  After the interventions stated above, I reevaluated the patient and found that they remained clinically stable.   Clinical Course as of 04/24/20 1115  Tue Apr 23, 2020  1733 Sig improvement of CP after SL nitro.  Initial trop 25.  BNP near baseline.  I've paged cardiology to further discuss this case [MT]  1821 I discussed the case with Dr Rudene Anda from cardiology who noted the patient had a CT coronary scan performed within the past 2 years with a calcium score of 0 and no CAD noted - therefore this is less likely related to CAD, and may be related to CHF exacerbation or hypertensive emergency. The patient's chest pain did improve with SL nitro and BP control.  The cardiologist and I felt the patient should be admitted to a cardiology service at [facility] and managed on an IV drip with anti hypertensives.  However the patient informed me he could not stay tonight due to urgent family obligations.  I explained the critical nature of this situation, with his advanced cardiomyopathy, leaving this untreated can result in worsening heart failure or death.  He understands this, and  states he will return if his heart failure worsens.  In the meantime we will give him his evening coreg 25 mg and also start him on hydralazine 25 mg TID, including now, to continue controlling his BP. [MT]  1824 He has a newly bought BP cuff at home and I instructed him to use it regularly and keep a diary.  I also notified Dr Rudene Anda about him leaving AMA, and she will work on having their office arrange urgent follow up with him. [MT]  1924 1/10 chest pain.  2nd trop flat.  Patient will leave AMA at this time. [MT]    Clinical Course User Index [MT] Chaia Ikard, Kermit Balo, MD    Final Clinical Impression(s) / ED Diagnoses Final diagnoses:  Hypertensive emergency  Chest pain, unspecified type    Rx / DC Orders ED Discharge Orders         Ordered    hydrALAZINE (APRESOLINE) 25 MG tablet  3 times daily        04/23/20 1850           Terald Sleeper, MD 04/24/20 1117

## 2020-04-24 LAB — CBC WITH DIFFERENTIAL/PLATELET
HCT: 40.9 % (ref 39.0–52.0)
Hemoglobin: 13.2 g/dL (ref 13.0–17.0)
MCH: 26.9 pg (ref 26.0–34.0)
MCHC: 32.3 g/dL (ref 30.0–36.0)
MCV: 83.3 fL (ref 80.0–100.0)
Platelets: 244 10*3/uL (ref 150–400)
RBC: 4.91 MIL/uL (ref 4.22–5.81)
RDW: 15.9 % — ABNORMAL HIGH (ref 11.5–15.5)

## 2020-06-04 ENCOUNTER — Encounter: Payer: Self-pay | Admitting: Cardiology

## 2020-06-04 ENCOUNTER — Other Ambulatory Visit: Payer: Self-pay

## 2020-06-04 ENCOUNTER — Ambulatory Visit (INDEPENDENT_AMBULATORY_CARE_PROVIDER_SITE_OTHER): Payer: 59 | Admitting: Cardiology

## 2020-06-04 VITALS — BP 160/110 | HR 108 | Ht 72.0 in | Wt 232.0 lb

## 2020-06-04 DIAGNOSIS — R0600 Dyspnea, unspecified: Secondary | ICD-10-CM | POA: Diagnosis not present

## 2020-06-04 DIAGNOSIS — I10 Essential (primary) hypertension: Secondary | ICD-10-CM

## 2020-06-04 DIAGNOSIS — I5042 Chronic combined systolic (congestive) and diastolic (congestive) heart failure: Secondary | ICD-10-CM

## 2020-06-04 DIAGNOSIS — R0609 Other forms of dyspnea: Secondary | ICD-10-CM

## 2020-06-04 DIAGNOSIS — I420 Dilated cardiomyopathy: Secondary | ICD-10-CM

## 2020-06-04 MED ORDER — HYDRALAZINE HCL 50 MG PO TABS: 50.0000 mg | ORAL_TABLET | Freq: Three times a day (TID) | ORAL | 3 refills | Status: DC

## 2020-06-04 NOTE — Patient Instructions (Addendum)
Medication Instructions:  Your physician has recommended you make the following change in your medication:  START: Hydralazine 50 mg three times daily *If you need a refill on your cardiac medications before your next appointment, please call your pharmacy*   Lab Work: None If you have labs (blood work) drawn today and your tests are completely normal, you will receive your results only by: Marland Kitchen MyChart Message (if you have MyChart) OR . A paper copy in the mail If you have any lab test that is abnormal or we need to change your treatment, we will call you to review the results.   Testing/Procedures: None   Follow-Up: At [facility], you and your health needs are our priority.  As part of our continuing mission to provide you with exceptional heart care, we have created designated Provider Care Teams.  These Care Teams include your primary Cardiologist (physician) and Advanced Practice Providers (APPs -  Physician Assistants and Nurse Practitioners) who all work together to provide you with the care you need, when you need it.  We recommend signing up for the patient portal called "MyChart".  Sign up information is provided on this After Visit Summary.  MyChart is used to connect with patients for Virtual Visits (Telemedicine).  Patients are able to view lab/test results, encounter notes, upcoming appointments, etc.  Non-urgent messages can be sent to your provider as well.   To learn more about what you can do with MyChart, go to [email or web address].    Your next appointment:   1 month(s)  The format for your next appointment:   In Person  Provider:   Gypsy Balsam, MD   Other Instructions
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
## 2020-07-05 ENCOUNTER — Ambulatory Visit: Payer: 59 | Admitting: Cardiology

## 2020-08-02 ENCOUNTER — Ambulatory Visit (INDEPENDENT_AMBULATORY_CARE_PROVIDER_SITE_OTHER): Payer: 59 | Admitting: Cardiology

## 2020-08-02 ENCOUNTER — Other Ambulatory Visit: Payer: Self-pay

## 2020-08-02 ENCOUNTER — Encounter: Payer: Self-pay | Admitting: Cardiology

## 2020-08-02 VITALS — BP 138/76 | HR 76 | Ht 72.0 in | Wt 232.0 lb

## 2020-08-02 DIAGNOSIS — R0789 Other chest pain: Secondary | ICD-10-CM

## 2020-08-02 DIAGNOSIS — I10 Essential (primary) hypertension: Secondary | ICD-10-CM | POA: Diagnosis not present

## 2020-08-02 DIAGNOSIS — I5042 Chronic combined systolic (congestive) and diastolic (congestive) heart failure: Secondary | ICD-10-CM

## 2020-08-02 DIAGNOSIS — I420 Dilated cardiomyopathy: Secondary | ICD-10-CM | POA: Diagnosis not present

## 2020-08-02 NOTE — Progress Notes (Signed)
Cardiology Office Note:    Date:  08/02/2020   ID:  Jeffery Glenn, DOB 08-06-1970, MRN [id number]  PCP:  [facility]  Cardiologist:  Gypsy Balsam, MD    Referring MD: [facility]*   Chief Complaint  Patient presents with  . Follow-up  I am doing well  History of Present Illness:    Jeffery Glenn is a 50 y.o. male with past medical history significant for cardiomyopathy which appears to be nonischemic, coronary CT angio done within the last year showed no significant obstruction.  He also got essential hypertension [employer] exertion.  I been trying gradually put him on appropriate medication there is some issue with noncompliance but now it looks like he is doing quite well he ended up in the emergency room at the beginning of this year because of high blood pressure that was partially related to lack of medications.  Now he is doing well takes all medication his blood pressure is well controlled denies have any chest pain tightness squeezing pressure burning chest no shortness of breath he still works he works night shift and have no problem doing it  Past Medical History:  Diagnosis Date  . Atypical chest pain 05/17/2018  . Cardiomyopathy (HCC) ejection fraction 25 to 30% in February 2020, improvement to 30 to 35% in January 2021 06/09/2018   Ejection fraction 25 to 30% on echocardiogram from February 2020  . Congestive heart failure (CHF) (HCC) 05/06/2018  . Dyspnea on exertion 05/17/2018  . Essential hypertension 05/17/2018  . Hypertension     Past Surgical History:  Procedure Laterality Date  . HAND SURGERY    . NASAL SEPTUM SURGERY      Current Medications: Current Meds  Medication Sig  . amLODipine (NORVASC) 10 MG tablet Take 1 tablet (10 mg total) by mouth daily.  Marland Kitchen atorvastatin (LIPITOR) 20 MG tablet Take 1 tablet (20 mg total) by mouth daily.  . carvedilol (COREG) 25 MG tablet Take 1 tablet (25 mg total) by mouth 2 (two) times daily.   . fluticasone (FLONASE) 50 MCG/ACT nasal spray Place 1-2 sprays into both nostrils as needed for allergies or rhinitis.  . furosemide (LASIX) 40 MG tablet Take 1 tablet (40 mg total) by mouth daily.  . hydrALAZINE (APRESOLINE) 50 MG tablet Take 1 tablet (50 mg total) by mouth 3 (three) times daily.  Marland Kitchen ketoconazole (NIZORAL) 2 % shampoo Apply 1 application topically as directed.  . loratadine (CLARITIN) 10 MG tablet Take 10 mg by mouth daily as needed for allergies.  . sacubitril-valsartan (ENTRESTO) 97-103 MG Take 1 tablet by mouth 2 (two) times daily.  Marland Kitchen spironolactone (ALDACTONE) 25 MG tablet Take 0.5 tablets (12.5 mg total) by mouth daily.     Allergies:   Shellfish allergy and Peanut-containing drug products   Social History   Socioeconomic History  . Marital status: Married    Spouse name: Not on file  . Number of children: Not on file  . Years of education: Not on file  . Highest education level: Not on file  Occupational History  . Not on file  Tobacco Use  . Smoking status: Never Smoker  . Smokeless tobacco: Never Used  Vaping Use  . Vaping Use: Never used  Substance and Sexual Activity  . Alcohol use: Yes  . Drug use: No  . Sexual activity: Not on file  Other Topics Concern  . Not on file  Social History Narrative  . Not  on file   Social Determinants of Health   Financial Resource Strain: Not on file  Food Insecurity: Not on file  Transportation Needs: Not on file  Physical Activity: Not on file  Stress: Not on file  Social Connections: Not on file     Family History: The patient's family history includes Heart attack in his father and mother; Hypertension in his father and mother. ROS:   Please see the history of present illness.    All 14 point review of systems negative except as described per history of present illness  EKGs/Labs/Other Studies Reviewed:      Recent Labs: 04/23/2020: B Natriuretic Peptide 495.6; BUN 16; Creatinine, Ser 1.13;  Hemoglobin 12.8; Magnesium 2.0; Platelets 226; Potassium 3.3; Sodium 140  Recent Lipid Panel    Component Value Date/Time   CHOL 184 01/31/2008 2114   TRIG 62 01/31/2008 2114   HDL 52 01/31/2008 2114   CHOLHDL 3.5 Ratio 01/31/2008 2114   VLDL 12 01/31/2008 2114   LDLCALC 120 (H) 01/31/2008 2114    Physical Exam:    VS:  BP 138/76 (BP Location: Right Arm, Patient Position: Sitting)   Pulse 76   Ht 6' (1.829 m)   Wt 232 lb (105.2 kg)   SpO2 98%   BMI 31.46 kg/m     Wt Readings from Last 3 Encounters:  08/02/20 232 lb (105.2 kg)  06/04/20 232 lb (105.2 kg)  04/23/20 225 lb (102.1 kg)     GEN:  Well nourished, well developed in no acute distress HEENT: Normal NECK: No JVD; No carotid bruits LYMPHATICS: No lymphadenopathy CARDIAC: RRR, no murmurs, no rubs, no gallops RESPIRATORY:  Clear to auscultation without rales, wheezing or rhonchi  ABDOMEN: Soft, non-tender, non-distended MUSCULOSKELETAL:  No edema; No deformity  SKIN: Warm and dry LOWER EXTREMITIES: no swelling NEUROLOGIC:  Alert and oriented x 3 PSYCHIATRIC:  Normal affect   ASSESSMENT:    1. Dilated cardiomyopathy (HCC)   2. Essential hypertension   3. Chronic combined systolic and diastolic congestive heart failure (HCC)   4. Atypical chest pain    PLAN:    In order of problems listed above:  1. Dilated cardiomyopathy on appropriate medication which I will continue.  We will recheck his left ventricle ejection fraction if ejection fraction is less than 35% he be referred to EP for evaluation for possible ICD. 2. Essential hypertension finally reasonably controlled we will continue present management. 3. Chronic systolic and diastolic congestive heart failure he appears to become present on physical exam today.  He is New York Heart Association class II. 4. Atypical chest pain denies having any.  I did review note from the emergency room for this visit Medication Adjustments/Labs and Tests  Ordered: Current medicines are reviewed at length with the patient today.  Concerns regarding medicines are outlined above.  No orders of the defined types were placed in this encounter.  Medication changes: No orders of the defined types were placed in this encounter.   Signed, Georgeanna Lea, MD, [facility] 08/02/2020 1:46 PM    [facility]

## 2020-08-02 NOTE — Patient Instructions (Signed)
Medication Instructions:  Your physician recommends that you continue on your current medications as directed. Please refer to the Current Medication list given to you today.  *If you need a refill on your cardiac medications before your next appointment, please call your pharmacy*   Lab Work: None If you have labs (blood work) drawn today and your tests are completely normal, you will receive your results only by: . MyChart Message (if you have MyChart) OR . A paper copy in the mail If you have any lab test that is abnormal or we need to change your treatment, we will call you to review the results.   Testing/Procedures: Your physician has requested that you have an echocardiogram. Echocardiography is a painless test that uses sound waves to create images of your heart. It provides your doctor with information about the size and shape of your heart and how well your heart's chambers and valves are working. This procedure takes approximately one hour. There are no restrictions for this procedure.     Follow-Up: At [facility], you and your health needs are our priority.  As part of our continuing mission to provide you with exceptional heart care, we have created designated Provider Care Teams.  These Care Teams include your primary Cardiologist (physician) and Advanced Practice Providers (APPs -  Physician Assistants and Nurse Practitioners) who all work together to provide you with the care you need, when you need it.  We recommend signing up for the patient portal called "MyChart".  Sign up information is provided on this After Visit Summary.  MyChart is used to connect with patients for Virtual Visits (Telemedicine).  Patients are able to view lab/test results, encounter notes, upcoming appointments, etc.  Non-urgent messages can be sent to your provider as well.   To learn more about what you can do with MyChart, go to [email or web address].    Your next appointment:   3  month(s)  The format for your next appointment:   In Person  Provider:   Robert Krasowski, MD   Other Instructions   Echocardiogram An echocardiogram is a test that uses sound waves (ultrasound) to produce images of the heart. Images from an echocardiogram can provide important information about:  Heart size and shape.  The size and thickness and movement of your heart's walls.  Heart muscle function and strength.  Heart valve function or if you have stenosis. Stenosis is when the heart valves are too narrow.  If blood is flowing backward through the heart valves (regurgitation).  A tumor or infectious growth around the heart valves.  Areas of heart muscle that are not working well because of poor blood flow or injury from a heart attack.  Aneurysm detection. An aneurysm is a weak or damaged part of an artery wall. The wall bulges out from the normal force of blood pumping through the body. Tell a health care provider about:  Any allergies you have.  All medicines you are taking, including vitamins, herbs, eye drops, creams, and over-the-counter medicines.  Any blood disorders you have.  Any surgeries you have had.  Any medical conditions you have.  Whether you are pregnant or may be pregnant. What are the risks? Generally, this is a safe test. However, problems may occur, including an allergic reaction to dye (contrast) that may be used during the test. What happens before the test? No specific preparation is needed. You may eat and drink normally. What happens during the test?  You will take off your   clothes from the waist up and put on a hospital gown.  Electrodes or electrocardiogram (ECG)patches may be placed on your chest. The electrodes or patches are then connected to a device that monitors your heart rate and rhythm.  You will lie down on a table for an ultrasound exam. A gel will be applied to your chest to help sound waves pass through your skin.  A  handheld device, called a transducer, will be pressed against your chest and moved over your heart. The transducer produces sound waves that travel to your heart and bounce back (or "echo" back) to the transducer. These sound waves will be captured in real-time and changed into images of your heart that can be viewed on a video monitor. The images will be recorded on a computer and reviewed by your health care provider.  You may be asked to change positions or hold your breath for a short time. This makes it easier to get different views or better views of your heart.  In some cases, you may receive contrast through an IV in one of your veins. This can improve the quality of the pictures from your heart. The procedure may vary among health care providers and hospitals.   What can I expect after the test? You may return to your normal, everyday life, including diet, activities, and medicines, unless your health care provider tells you not to do that. Follow these instructions at home:  It is up to you to get the results of your test. Ask your health care provider, or the department that is doing the test, when your results will be ready.  Keep all follow-up visits. This is important. Summary  An echocardiogram is a test that uses sound waves (ultrasound) to produce images of the heart.  Images from an echocardiogram can provide important information about the size and shape of your heart, heart muscle function, heart valve function, and other possible heart problems.  You do not need to do anything to prepare before this test. You may eat and drink normally.  After the echocardiogram is completed, you may return to your normal, everyday life, unless your health care provider tells you not to do that. This information is not intended to replace advice given to you by your health care provider. Make sure you discuss any questions you have with your health care provider. Document Revised:  10/17/2019 Document Reviewed: 10/17/2019 Elsevier Patient Education  2021 Elsevier Inc.   

## 2020-08-02 NOTE — Addendum Note (Signed)
Addended by: Hazle Quant on: 08/02/2020 01:51 PM   Modules accepted: Orders

## 2020-08-21 IMAGING — DX DG CHEST 2V
2 series · 2 of 2 positions shown · non-contrast
Comparison: 02/10/2017.

CLINICAL DATA: Persistent cough and shortness of breath for the
past 2 days. Upper back pain.

EXAM:
CHEST - 2 VIEW

[chest pa]
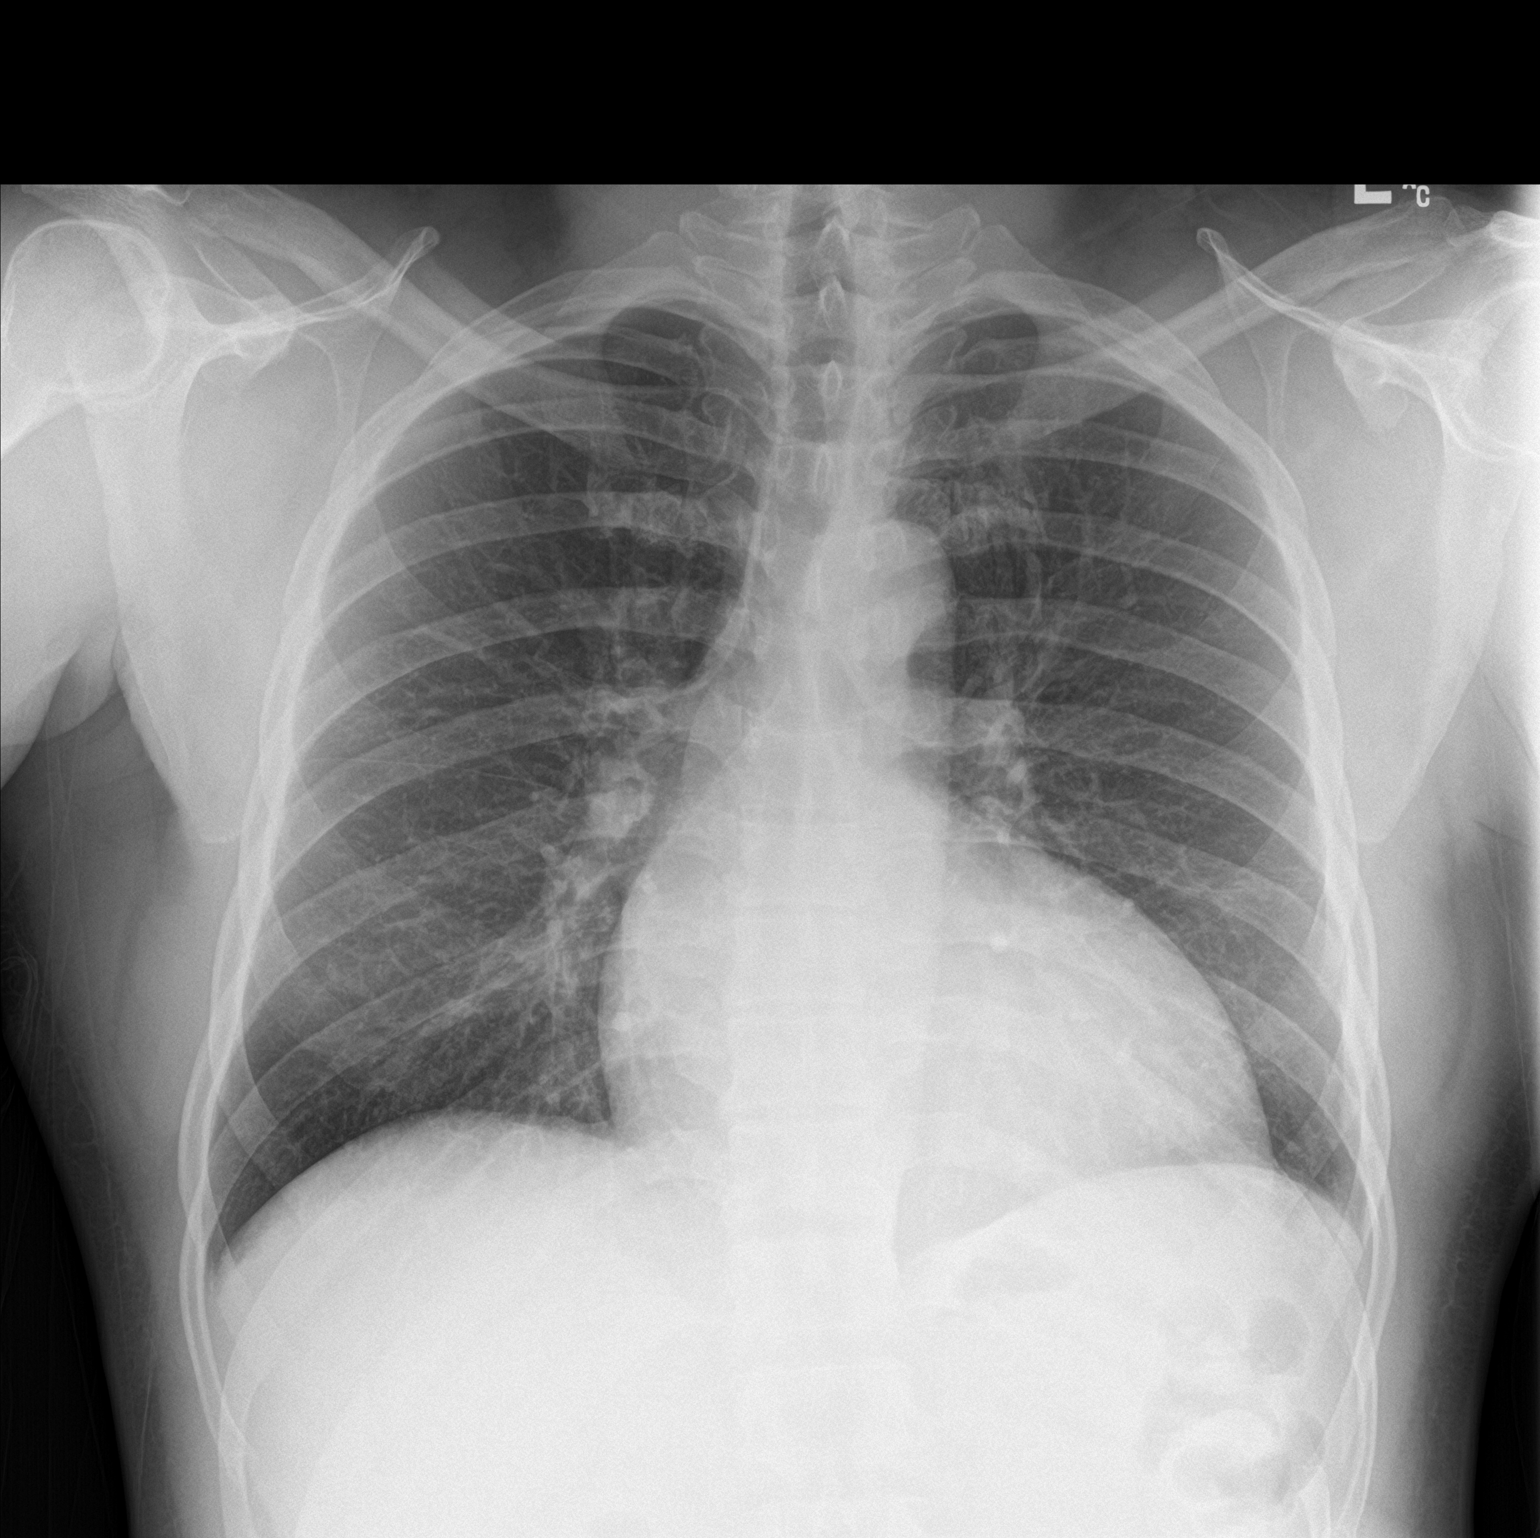

[chest lat]
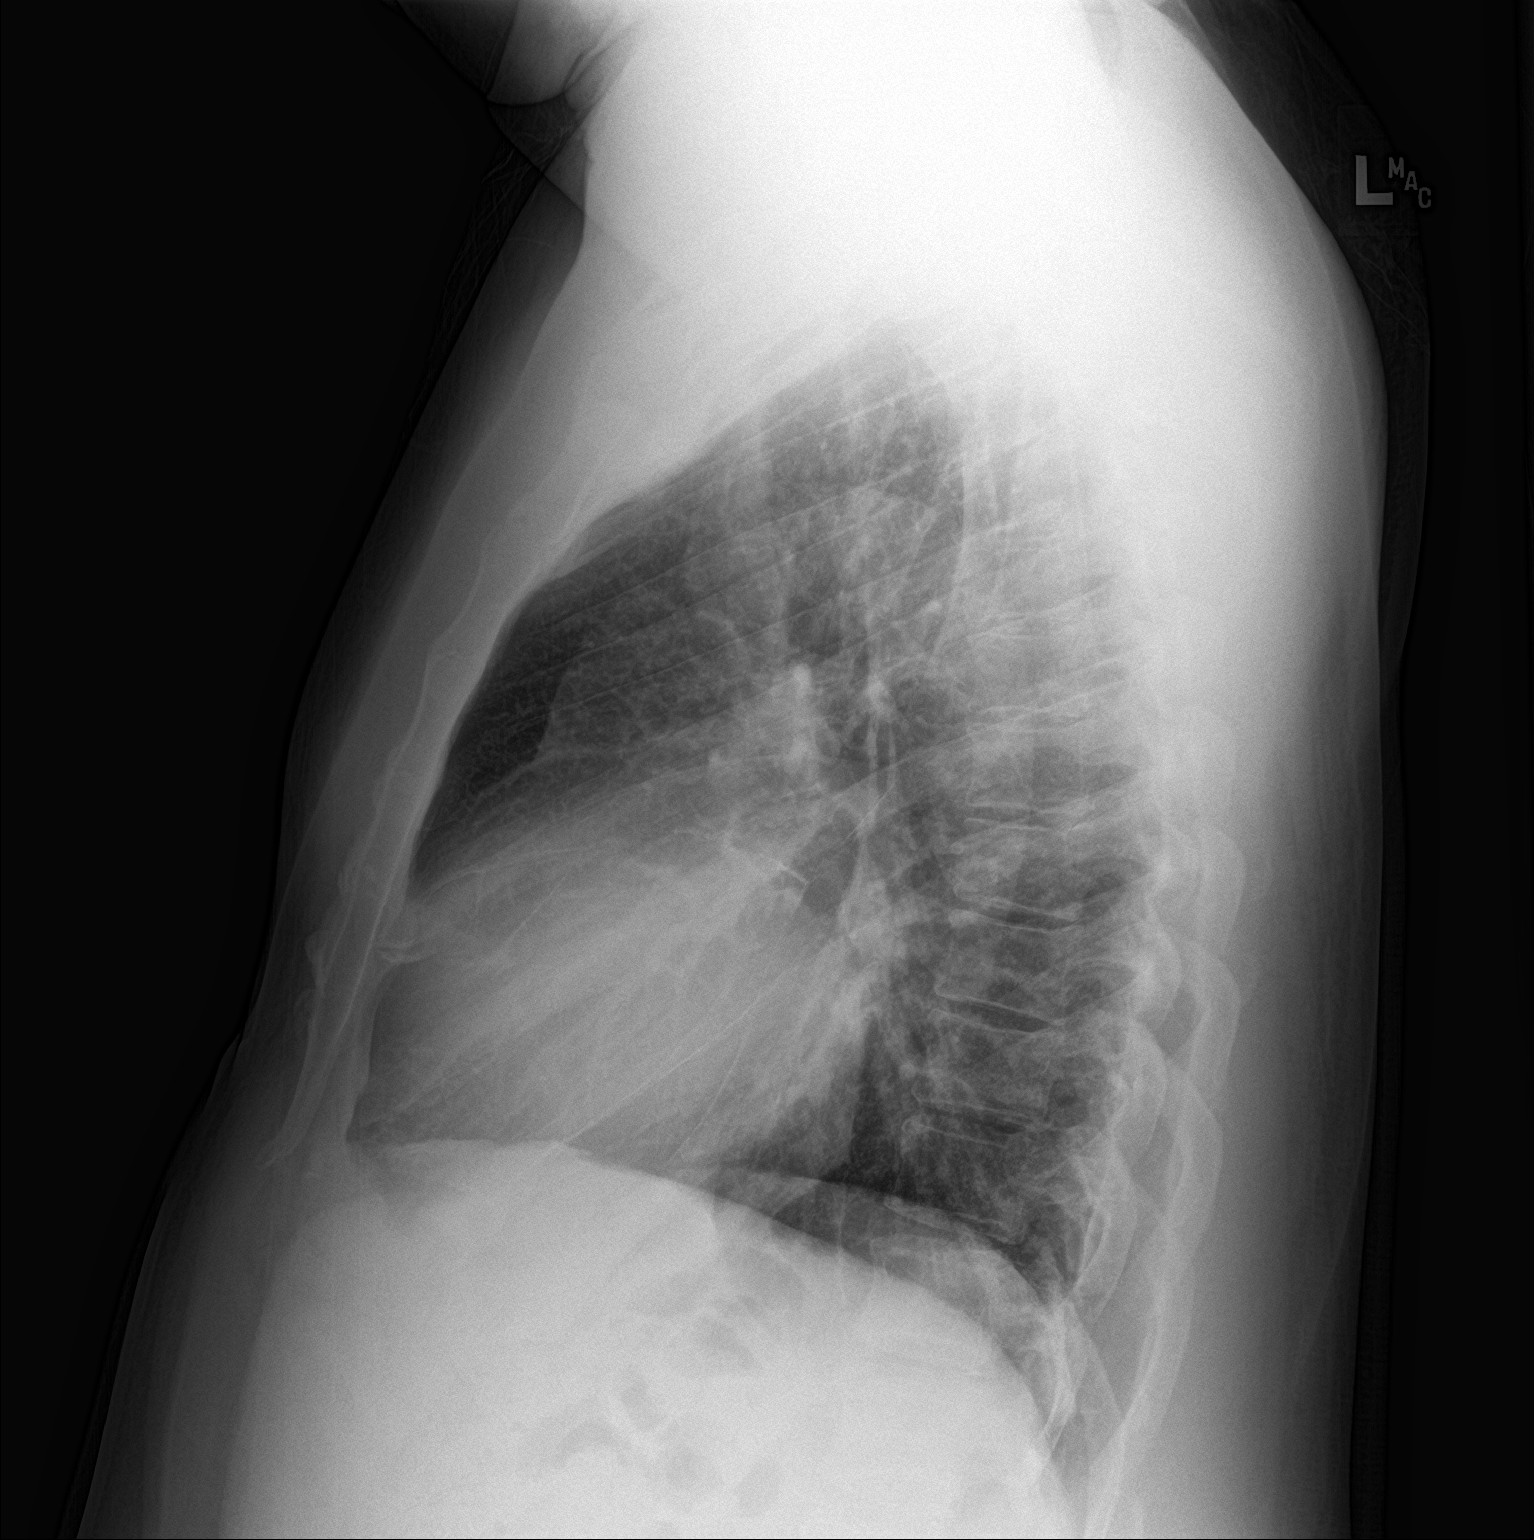

[2 of 2 positions shown; findings below may reference images not displayed]

FINDINGS: Interval mildly enlarged cardiac silhouette. Clear lungs with normal
vascularity. Normal appearing bones.
IMPRESSION: Interval mild cardiomegaly. Otherwise, unremarkable examination.

## 2020-09-03 ENCOUNTER — Ambulatory Visit (HOSPITAL_BASED_OUTPATIENT_CLINIC_OR_DEPARTMENT_OTHER): Payer: 59

## 2020-09-08 ENCOUNTER — Encounter (HOSPITAL_BASED_OUTPATIENT_CLINIC_OR_DEPARTMENT_OTHER): Payer: Self-pay | Admitting: Emergency Medicine

## 2020-09-08 ENCOUNTER — Emergency Department (HOSPITAL_BASED_OUTPATIENT_CLINIC_OR_DEPARTMENT_OTHER)
Admission: EM | Admit: 2020-09-08 | Discharge: 2020-09-08 | Disposition: A | Discharge status: Home / Self Care | Payer: 59 | Attending: Emergency Medicine | Admitting: Emergency Medicine

## 2020-09-08 ENCOUNTER — Other Ambulatory Visit: Payer: Self-pay

## 2020-09-08 DIAGNOSIS — Z79899 Other long term (current) drug therapy: Secondary | ICD-10-CM | POA: Diagnosis not present

## 2020-09-08 DIAGNOSIS — R070 Pain in throat: Secondary | ICD-10-CM | POA: Diagnosis present

## 2020-09-08 DIAGNOSIS — Z20822 Contact with and (suspected) exposure to covid-19: Secondary | ICD-10-CM | POA: Diagnosis not present

## 2020-09-08 DIAGNOSIS — Z91010 Allergy to peanuts: Secondary | ICD-10-CM | POA: Insufficient documentation

## 2020-09-08 DIAGNOSIS — J020 Streptococcal pharyngitis: Secondary | ICD-10-CM | POA: Diagnosis not present

## 2020-09-08 DIAGNOSIS — I509 Heart failure, unspecified: Secondary | ICD-10-CM | POA: Diagnosis not present

## 2020-09-08 DIAGNOSIS — I110 Hypertensive heart disease with heart failure: Secondary | ICD-10-CM | POA: Insufficient documentation

## 2020-09-08 LAB — RESP PANEL BY RT-PCR (FLU A&B, COVID) ARPGX2
Influenza A by PCR: NEGATIVE
Influenza B by PCR: NEGATIVE
SARS Coronavirus 2 by RT PCR: NEGATIVE

## 2020-09-08 LAB — GROUP A STREP BY PCR: Group A Strep by PCR: DETECTED — AB

## 2020-09-08 MED ORDER — NAPROXEN 250 MG PO TABS
500.0000 mg | ORAL_TABLET | Freq: Once | ORAL | Status: AC
  Administered 2020-09-08: 04:00:00 500 mg via ORAL
  Filled 2020-09-08: qty 2

## 2020-09-08 MED ORDER — CEPACOL SORE THROAT MAX NUMB 15-4 MG MT LOZG: 1.0000 | LOZENGE | OROMUCOSAL | 0 refills | Status: DC | PRN

## 2020-09-08 MED ORDER — PENICILLIN G BENZATHINE 1200000 UNIT/2ML IM SUSY
1.2000 10*6.[IU] | PREFILLED_SYRINGE | Freq: Once | INTRAMUSCULAR | Status: AC
  Administered 2020-09-08: 06:00:00 1.2 10*6.[IU] via INTRAMUSCULAR
  Filled 2020-09-08: qty 2

## 2020-09-08 NOTE — ED Triage Notes (Signed)
Reports ear pain, body aches, sweating, and sore throat since Wednesday.  Found out he was exposed to covid.  Taking otc medication with no relief.

## 2020-09-08 NOTE — ED Provider Notes (Signed)
[facility] Provider Note: [name], MD, FACEP  CSN: [id number] MRN: [id number] ARRIVAL: 09/08/20 at 0400 ROOM: [facility]05/[facility]05   CHIEF COMPLAINT  Generalized Body Aches   HISTORY OF PRESENT ILLNESS  09/08/20 4:13 AM Epic Tribbett is a 50 y.o. male who has been sick for 4 days.  Specifically he has had generalized body aches, night sweats and sore throat.  The sore throat is worse with swallowing and is severe enough to prevent him from eating.  He also has pain in his ears when swallowing.  He rates his body aches as a 10 out of 10.  He has been taking Tylenol based over-the-counter drugs without relief.  He denies nasal congestion, rhinorrhea, chest pain, cough, shortness of breath, loss of taste or smell, nausea, vomiting and diarrhea.  He denies fever but was noted to have a temperature of 99.6 on arrival.   Past Medical History:  Diagnosis Date   Atypical chest pain 05/17/2018   Cardiomyopathy (HCC) ejection fraction 25 to 30% in February 2020, improvement to 30 to 35% in January 2021 06/09/2018   Ejection fraction 25 to 30% on echocardiogram from February 2020   Congestive heart failure (CHF) (HCC) 05/06/2018   Dyspnea on exertion 05/17/2018   Essential hypertension 05/17/2018   Hypertension     Past Surgical History:  Procedure Laterality Date   HAND SURGERY     NASAL SEPTUM SURGERY      Family History  Problem Relation Age of Onset   Heart attack Mother    Hypertension Mother    Heart attack Father    Hypertension Father     Social History   Tobacco Use   Smoking status: Never   Smokeless tobacco: Never  Vaping Use   Vaping Use: Never used  Substance Use Topics   Alcohol use: Yes   Drug use: No    Prior to Admission medications   Medication Sig Start Date End Date Taking? Authorizing Provider  Benzocaine-Menthol (CEPACOL SORE THROAT MAX NUMB) 15-4 MG LOZG Use as directed 1 lozenge in the mouth or throat every 2 (two) hours as needed (for sore  throat). 09/08/20  Yes Everett Ehrler, MD  amLODipine (NORVASC) 10 MG tablet Take 1 tablet (10 mg total) by mouth daily. 03/05/20   Georgeanna Lea, MD  atorvastatin (LIPITOR) 20 MG tablet Take 1 tablet (20 mg total) by mouth daily. 03/05/20   Georgeanna Lea, MD  carvedilol (COREG) 25 MG tablet Take 1 tablet (25 mg total) by mouth 2 (two) times daily. 03/05/20   Georgeanna Lea, MD  fluticasone (FLONASE) 50 MCG/ACT nasal spray Place 1-2 sprays into both nostrils as needed for allergies or rhinitis. 11/09/18   [provider]  furosemide (LASIX) 40 MG tablet Take 1 tablet (40 mg total) by mouth daily. 03/05/20   Georgeanna Lea, MD  hydrALAZINE (APRESOLINE) 50 MG tablet Take 1 tablet (50 mg total) by mouth 3 (three) times daily. 06/04/20 09/02/20  Georgeanna Lea, MD  ketoconazole (NIZORAL) 2 % shampoo Apply 1 application topically as directed. 01/17/20   [provider]  loratadine (CLARITIN) 10 MG tablet Take 10 mg by mouth daily as needed for allergies. 02/27/19   [provider]  sacubitril-valsartan (ENTRESTO) 97-103 MG Take 1 tablet by mouth 2 (two) times daily. 03/05/20   Georgeanna Lea, MD  spironolactone (ALDACTONE) 25 MG tablet Take 0.5 tablets (12.5 mg total) by mouth daily. 03/05/20   Georgeanna Lea, MD  Allergies Shellfish allergy and Peanut-containing drug products   REVIEW OF SYSTEMS  Negative except as noted here or in the History of Present Illness.   PHYSICAL EXAMINATION  Initial Vital Signs Blood pressure (!) 175/130, pulse (!) 120, temperature 99.6 F (37.6 C), temperature source Oral, resp. rate 18, height 6' (1.829 m), weight 105.2 kg, SpO2 98 %.  Examination General: Well-developed, well-nourished male in no acute distress; appearance consistent with age of record HENT: normocephalic; atraumatic; TMs normal; uvula midline; tonsils somewhat enlarged and erythematous but without exudate Eyes: pupils equal, round and  reactive to light; extraocular muscles intact Neck: supple; no lymphadenopathy Heart: regular rate and rhythm Lungs: clear to auscultation bilaterally Abdomen: soft; nondistended; nontender; bowel sounds present Extremities: No deformity; full range of motion; pulses normal Neurologic: Awake, alert and oriented; motor function intact in all extremities and symmetric; no facial droop Skin: Warm and dry Psychiatric: Normal mood and affect   RESULTS  Summary of this visit's results, reviewed and interpreted by myself:   EKG Interpretation  Date/Time:    Ventricular Rate:    PR Interval:    QRS Duration:   QT Interval:    QTC Calculation:   R Axis:     Text Interpretation:          Laboratory Studies: Results for orders placed or performed during the hospital encounter of 09/08/20 (from the past 24 hour(s))  Resp Panel by RT-PCR (Flu A&B, Covid) Nasopharyngeal Swab     Status: None   Collection Time: 09/08/20  4:24 AM   Specimen: Nasopharyngeal Swab; Nasopharyngeal(NP) swabs in vial transport medium  Result Value Ref Range   SARS Coronavirus 2 by RT PCR NEGATIVE NEGATIVE   Influenza A by PCR NEGATIVE NEGATIVE   Influenza B by PCR NEGATIVE NEGATIVE  Group A Strep by PCR     Status: Abnormal   Collection Time: 09/08/20  4:24 AM   Specimen: Nasopharyngeal Swab; Sterile Swab  Result Value Ref Range   Group A Strep by PCR DETECTED (A) NOT DETECTED   Imaging Studies: No results found.  ED COURSE and MDM  Nursing notes, initial and subsequent vitals signs, including pulse oximetry, reviewed and interpreted by myself.  Vitals:   09/08/20 0410  BP: (!) 161/109  Pulse: (!) 120  Resp: 18  Temp: 99.6 F (37.6 C)  TempSrc: Oral  SpO2: 98%  Weight: 105.2 kg  Height: 6' (1.829 m)   Medications  penicillin g benzathine (BICILLIN LA) 1200000 UNIT/2ML injection 1.2 Million Units (has no administration in time range)  naproxen (NAPROSYN) tablet 500 mg (500 mg Oral Given  09/08/20 0429)   Patient positive for strep.  We will give Bicillin LA injection.  We will do supportive steroids given his he has CHF and wish to avoid drugs that may cause fluid retention.   PROCEDURES  Procedures   ED DIAGNOSES     ICD-10-CM   1. Streptococcal pharyngitis  J02.0          Theora Vankirk, MD 09/08/20 [phone]

## 2020-09-18 ENCOUNTER — Other Ambulatory Visit: Payer: Self-pay

## 2020-09-18 ENCOUNTER — Ambulatory Visit (HOSPITAL_BASED_OUTPATIENT_CLINIC_OR_DEPARTMENT_OTHER)
Admission: RE | Admit: 2020-09-18 | Discharge: 2020-09-18 | Disposition: A | Discharge status: Home / Self Care | Payer: 59 | Source: Ambulatory Visit | Attending: Cardiology | Admitting: Cardiology

## 2020-09-18 DIAGNOSIS — I10 Essential (primary) hypertension: Secondary | ICD-10-CM | POA: Diagnosis not present

## 2020-09-18 DIAGNOSIS — I5042 Chronic combined systolic (congestive) and diastolic (congestive) heart failure: Secondary | ICD-10-CM | POA: Insufficient documentation

## 2020-09-18 DIAGNOSIS — I420 Dilated cardiomyopathy: Secondary | ICD-10-CM | POA: Insufficient documentation

## 2020-09-18 DIAGNOSIS — R0789 Other chest pain: Secondary | ICD-10-CM | POA: Diagnosis present

## 2020-09-18 LAB — ECHOCARDIOGRAM COMPLETE
Area-P 1/2: 3.08 cm2
Calc EF: 24.8 %
MV M vel: 4.48 m/s
MV Peak grad: 80.3 mmHg
MV VTI: 2.5 cm2
Radius: 0.5 cm
S' Lateral: 5.54 cm
Single Plane A2C EF: 31.1 %
Single Plane A4C EF: 23 %

## 2020-09-18 NOTE — Progress Notes (Signed)
*  PRELIMINARY RESULTS* Echocardiogram 2D Echocardiogram has been performed.  Neomia Dear RDCS 09/18/2020, 1:08 PM

## 2020-09-20 NOTE — Addendum Note (Signed)
Addended by: Eleonore Chiquito on: 09/20/2020 01:39 PM   Modules accepted: Orders

## 2020-09-30 ENCOUNTER — Ambulatory Visit (INDEPENDENT_AMBULATORY_CARE_PROVIDER_SITE_OTHER): Payer: 59 | Admitting: Cardiology

## 2020-09-30 ENCOUNTER — Other Ambulatory Visit: Payer: Self-pay

## 2020-09-30 ENCOUNTER — Encounter: Payer: Self-pay | Admitting: *Deleted

## 2020-09-30 ENCOUNTER — Encounter: Payer: Self-pay | Admitting: Cardiology

## 2020-09-30 VITALS — BP 174/106 | HR 81 | Ht 72.0 in | Wt 230.0 lb

## 2020-09-30 DIAGNOSIS — I428 Other cardiomyopathies: Secondary | ICD-10-CM

## 2020-09-30 NOTE — Progress Notes (Signed)
Electrophysiology Office Note   Date:  09/30/2020   ID:  Jeffery Glenn, DOB [birth year], MRN [id number]  PCP:  [facility]  Cardiologist:  Bing Matter Primary Electrophysiologist:  Layan Zalenski Jorja Loa, MD    Chief Complaint: CHF   History of Present Illness: Jeffery Glenn is a 50 y.o. male who is being seen today for the evaluation of CHF at the request of [facility] Presenting today for electrophysiology evaluation.  He has a history of nonischemic cardiomyopathy and hypertension.  He had a cardiac CT that showed no evidence of coronary artery disease.  Today, denies symptoms of palpitations, chest pain, shortness of breath, orthopnea, PND, lower extremity edema, claudication, dizziness, presyncope, syncope, bleeding, or neurologic sequela. The patient is tolerating medications without difficulties.  His main complaint is fatigue.  He does not have much shortness of breath.  He states that he could fall asleep at most times the day.  Aside from that, he is well and is without complaint.  Unfortunately his ejection fraction has not improved.  He is ready for.  ICD implant.   Past Medical History:  Diagnosis Date   Atypical chest pain 05/17/2018   Cardiomyopathy (HCC) ejection fraction 25 to 30% in February 2020, improvement to 30 to 35% in January 2021 06/09/2018   Ejection fraction 25 to 30% on echocardiogram from February 2020   Congestive heart failure (CHF) (HCC) 05/06/2018   Dyspnea on exertion 05/17/2018   Essential hypertension 05/17/2018   Hypertension    Past Surgical History:  Procedure Laterality Date   HAND SURGERY     NASAL SEPTUM SURGERY       Current Outpatient Medications  Medication Sig Dispense Refill   amLODipine (NORVASC) 10 MG tablet Take 1 tablet (10 mg total) by mouth daily. 90 tablet 3   atorvastatin (LIPITOR) 20 MG tablet Take 1 tablet (20 mg total) by mouth daily. 90 tablet 3   Benzocaine-Menthol (CEPACOL SORE THROAT  MAX NUMB) 15-4 MG LOZG Use as directed 1 lozenge in the mouth or throat every 2 (two) hours as needed (for sore throat). 9 lozenge 0   carvedilol (COREG) 25 MG tablet Take 1 tablet (25 mg total) by mouth 2 (two) times daily. 180 tablet 3   fluticasone (FLONASE) 50 MCG/ACT nasal spray Place 1-2 sprays into both nostrils as needed for allergies or rhinitis.     furosemide (LASIX) 40 MG tablet Take 1 tablet (40 mg total) by mouth daily. 90 tablet 3   ketoconazole (NIZORAL) 2 % shampoo Apply 1 application topically as directed.     loratadine (CLARITIN) 10 MG tablet Take 10 mg by mouth daily as needed for allergies.     sacubitril-valsartan (ENTRESTO) 97-103 MG Take 1 tablet by mouth 2 (two) times daily. 180 tablet 3   spironolactone (ALDACTONE) 25 MG tablet Take 0.5 tablets (12.5 mg total) by mouth daily. 45 tablet 3   hydrALAZINE (APRESOLINE) 50 MG tablet Take 1 tablet (50 mg total) by mouth 3 (three) times daily. 270 tablet 3   No current facility-administered medications for this visit.    Allergies:   Shellfish allergy and Peanut-containing drug products   Social History:  The patient  reports that he has never smoked. He has never used smokeless tobacco. He reports current alcohol use. He reports that he does not use drugs.   Family History:  The patient's family history includes Heart attack in his father and mother; Hypertension in his father and  mother.   ROS:  Please see the history of present illness.   Otherwise, review of systems is positive for none.   All other systems are reviewed and negative.   PHYSICAL EXAM: VS:  BP (!) 174/106   Pulse 81   Ht 6' (1.829 m)   Wt 230 lb (104.3 kg)   SpO2 97%   BMI 31.19 kg/m  , BMI Body mass index is 31.19 kg/m. GEN: Well nourished, well developed, in no acute distress  HEENT: normal  Neck: no JVD, carotid bruits, or masses Cardiac: RRR; no murmurs, rubs, or gallops,no edema  Respiratory:  clear to auscultation bilaterally, normal work  of breathing GI: soft, nontender, nondistended, + BS MS: no deformity or atrophy  Skin: warm and dry Neuro:  Strength and sensation are intact Psych: euthymic mood, full affect  EKG:  EKG is ordered today. Personal review of the ekg ordered shows sinus rhythm, right superior axis, LVH, rate 81  Recent Labs: 04/23/2020: B Natriuretic Peptide 495.6; BUN 16; Creatinine, Ser 1.13; Hemoglobin 12.8; Magnesium 2.0; Platelets 226; Potassium 3.3; Sodium 140    Lipid Panel     Component Value Date/Time   CHOL 184 01/31/2008 2114   TRIG 62 01/31/2008 2114   HDL 52 01/31/2008 2114   CHOLHDL 3.5 Ratio 01/31/2008 2114   VLDL 12 01/31/2008 2114   LDLCALC 120 (H) 01/31/2008 2114     Wt Readings from Last 3 Encounters:  09/30/20 230 lb (104.3 kg)  09/08/20 231 lb 14.8 oz (105.2 kg)  08/02/20 232 lb (105.2 kg)      Other studies Reviewed: Additional studies/ records that were reviewed today include: TTE 09/18/2020 Review of the above records today demonstrates:   1. Left ventricular ejection fraction, by estimation, is 20 to 25%. The  left ventricle has severely decreased function. The left ventricle  demonstrates global hypokinesis. The left ventricular internal cavity size  was severely dilated. There is moderate   concentric left ventricular hypertrophy. Left ventricular diastolic  parameters are consistent with Grade I diastolic dysfunction (impaired  relaxation).   2. Right ventricular systolic function is mildly reduced. The right  ventricular size is mildly enlarged. There is normal pulmonary artery  systolic pressure.   3. Left atrial size was severely dilated.   4. The mitral valve is normal in structure. Mild mitral valve  regurgitation. No evidence of mitral stenosis.   5. The aortic valve is tricuspid. Aortic valve regurgitation is not  visualized. No aortic stenosis is present.   6. The inferior vena cava is normal in size with greater than 50%  respiratory variability,  suggesting right atrial pressure of 3 mmHg.   Coronary CTA 06/15/19 1. Coronary calcium score of 0. This was 0 percentile for age and sex matched control. 2. Normal coronary origin with right dominance.  Ramus branch noted. 3. No evidence of CAD. 4. There is left ventricular hypertrophy (20 mm septum and 16 mm lateral wall). 5. Non ischemic cardiomyopathy, probably hypertensive in etiology.  ASSESSMENT AND PLAN:  1.  Chronic systolic heart failure due to nonischemic cardiomyopathy: Currently on optimal medical therapy with carvedilol, Entresto, Aldactone.  Unfortunately his ejection fraction remains low.  He would benefit from ICD implant.  We discussed both traditional and S ICD.  This point, he would prefer a past ICD.  Risks and benefits of discussed.  Risk include bleeding and infection.  He understands these risks and has agreed to the procedure.  2.  Hypertension: Significantly elevated today.  Ayerim Berquist increase hydralazine to 100 mg  Current medicines are reviewed at length with the patient today.   The patient does not have concerns regarding his medicines.  The following changes were made today: Increase hydralazine  Labs/ tests ordered today include:  Orders Placed This Encounter  Procedures   EKG 12-Lead      Disposition:   FU with Yoshiko Keleher 3 months  Signed, Teale Goodgame Jorja Loa, MD  09/30/2020 3:36 PM     [facility] [address] [place] [address] [phone] (office) [phone] (fax)

## 2020-09-30 NOTE — Patient Instructions (Addendum)
Medication Instructions:  Your physician recommends that you continue on your current medications as directed. Please refer to the Current Medication list given to you today.  *If you need a refill on your cardiac medications before your next appointment, please call your pharmacy*   Lab Work: Pre procedure labs on _______________: CBC & BMET If you have labs (blood work) drawn today and your tests are completely normal, you will receive your results only by: MyChart Message (if you have MyChart) OR A paper copy in the mail If you have any lab test that is abnormal or we need to change your treatment, we will call you to review the results.   Testing/Procedures: Your physician has recommended that you have a defibrillator inserted. An implantable cardioverter defibrillator (ICD) is a small device that is placed in your chest or, in rare cases, your abdomen. This device uses electrical pulses or shocks to help control life-threatening, irregular heartbeats that could lead the heart to suddenly stop beating (sudden cardiac arrest). Leads are attached to the ICD that goes into your heart. This is done in the hospital and usually requires an overnight stay. Please see the instruction sheet below.   Follow-Up: At [facility], you and your health needs are our priority.  As part of our continuing mission to provide you with exceptional heart care, we have created designated Provider Care Teams.  These Care Teams include your primary Cardiologist (physician) and Advanced Practice Providers (APPs -  Physician Assistants and Nurse Practitioners) who all work together to provide you with the care you need, when you need it.  Your next appointment:   2 week(s) after your defibrillator implant  The format for your next appointment:   In Person  Provider:   Device clinic for a wound check    Thank you for choosing [facility]!!   Dory Horn, RN [phone]   Other  Instructions   Implantable Device Instructions  You are scheduled for: SICD implant on 11/08/2020 with Dr. Elberta Fortis.  1.   Pre procedure testing-             A.  LAB WORK--- On ___________  for your pre procedure blood work.  You do NOT need to be fasting.  On the day of your procedure 11/08/2020 you will go to [facility] [phone]) at 11:30 am.  Bonita Quin will go to the main entrance [address] [employer]) and enter where the [employer] are.  You will check in at ADMITTING.  You may have one support person come in to the hospital with you.  They will be asked to wait in the waiting room.   3.   Do not eat or drink after midnight prior to your procedure.   4.   On the morning of your procedure do NOT take any medication.   5.  Plan for an overnight stay, but you may be discharged home after your procedure. If you use your phone frequently bring your phone charger, in case you have to stay.  If you are discharged after your procedure you will need someone to drive you home and be with your for 24 hours after your procedure.   6.  You will follow up with the [facility] Device clinic 10-14 days after your procedure. You will follow up with Dr. Elberta Fortis 91 days after your procedure.  These appointments will be made for you.  7. FYI: For your safety, and to allow [place] to  monitor your vital signs accurately during the surgery/procedure we request that if you have artificial nails, gel coating, SNS etc. Please have those removed prior to your surgery/procedure. Not having the nail coverings /polish removed may result in cancellation or delay of your surgery/procedure.   * If you have ANY questions after you get home, please call the office [phone] and ask for Maisha Bogen RN or send a MyChart message.    Subcutaneous Cardioverter Defibrillator Implantation A subcutaneous implantable cardioverter defibrillator (S-ICD) is a device that identifies and corrects abnormal heart rhythms.  Subcutaneous cardioverter defibrillator implantation is a surgery to place an S-ICD under the skin in your chest. An S-ICD has a battery, a small computer (pulse generator), and a device that moves electrical currents (electrode). The S-ICD detects and corrects two types of dangerous irregular heart rhythms (arrhythmias): A rapid heart rhythm in the lower chambers of the heart (ventricles). This is called ventricular tachycardia. The ventricles contracting in an uncoordinated way. This is called ventricular fibrillation. When the S-ICD detects ventricular tachycardia or fibrillation, it sends a shock to the heart that attempts to restore the heartbeat to normal (defibrillation). The shock may feel like a strong jolt in the chest. Your health care provider may recommend S-ICD implantation if: You had an arrhythmia that started in the ventricles. Your heart has damage from heart disease or a heart condition. Your heart muscle is weak. You have congenital heart disease. Your health care provider may recommend implantation of an S-ICD instead of a traditional ICD if: You have a blood vessel disorder (vascular disease). You have medical conditions that increase your risk for infection or you have had a prior ICD infection. You do not need a pacemaker. You are younger in age and are expected to have a long-term need for defibrillation. Talk with your health care provider about the benefits of an S-ICD. Tell a health care provider about: Any allergies you have. All medicines you are taking, including vitamins, herbs, eye drops, creams, and over-the-counter medicines. Any problems you or family members have had with anesthetic medicines. Any blood disorders you have. Any surgeries you have had. Any medical conditions you have. Whether you are pregnant or may be pregnant. What are the risks? Generally, this is a safe procedure. However, problems may occur, including: Infection. Bleeding. Allergic  reactions to medicines or dyes. Failure to shock or correct the arrhythmia. Swelling and bruising. Blood clots. What happens before the procedure? Staying hydrated Follow instructions from your health care provider about hydration, which may include: Up to 2 hours before the procedure - you may continue to drink clear liquids, such as water, clear fruit juice, black coffee, and plain tea.  Eating and drinking restrictions Follow instructions from your health care provider about eating and drinking, which may include: 8 hours before the procedure - stop eating heavy meals or foods, such as meat, fried foods, or fatty foods. 6 hours before the procedure - stop eating light meals or foods, such as toast or cereal. 6 hours before the procedure - stop drinking milk or drinks that contain milk. 2 hours before the procedure - stop drinking clear liquids. Medicines Ask your health care provider about: Changing or stopping your regular medicines. This is especially important if you are taking diabetes medicines or blood thinners. Taking medicines such as aspirin and ibuprofen. These medicines can thin your blood. Do not take these medicines unless your health care provider tells you to take them. Taking over-the-counter medicines, vitamins, herbs,  and supplements. Tests You may have tests, such as: Blood tests. A test to check the electrical signals in your heart (electrocardiogram, ECG). Imaging tests, such as a chest X-ray. General instructions Do not use any products that contain nicotine or tobacco for at least 4 weeks before the procedure. These products include cigarettes, chewing tobacco, and vaping devices, such as e-cigarettes. If you need help quitting, ask your health care provider. Ask your health care provider: How your procedure site will be marked. What steps will be taken to help prevent infection. These may include: Removing hair at the surgery site. Washing skin with a  germ-killing soap. Taking antibiotic medicine. Plan to have a responsible adult care for you for the time you are told after you leave the hospital or clinic. This is important. What happens during the procedure? Small monitors will be put on your body. They will be used to check your heart, blood pressure, and oxygen level. An IV will be inserted into one of your veins. You will be given one or more of the following: A medicine to help you relax (sedative). A medicine to numb the area (local anesthetic). A medicine to make you fall asleep (general anesthetic). A small incision will be made on the left side of your chest near your rib cage, under your arm. A pocket (pouch) will be made in the skin on the left lower part of your chest. The S-ICD pulse generator will be inserted into this pocket. An electrode will be placed under the skin in your chest, near your breastbone (sternum). The electrode will be attached to the S-ICD pulse generator. The S-ICD will be tested, and your health care provider will program the S-ICD for the condition being treated. Your incision will be closed with stitches (sutures) or glue and adhesive strips. A bandage (dressing) may be placed over your incision. The procedure may vary among health care providers and hospitals. What happens after the procedure? Your blood pressure, heart rate, breathing rate, and blood oxygen level will be monitored until you leave the hospital or clinic. You will be given pain medicine as needed. You may have a chest X-ray to check the S-ICD. You will get an identification card explaining that you have an S-ICD. You will need to keep this card with you at all times. You will be given a remote home monitoring device to use with your S-ICD to allow your device to communicate with your clinic. Do not drive until your health care provider approves. Summary A subcutaneous cardioverter defibrillator implantation is a type of surgery to  implant a device that corrects dangerous irregular heart rhythms (arrhythmias). During the surgery, a subcutaneous implantable cardioverter defibrillator (S-ICD) is placed under the skin in the chest. The S-ICD electrode rests in the chest, but the electrode does not directly attach to the heart or blood vessels. This information is not intended to replace advice given to you by your health care provider. Make sure you discuss any questions you have with your healthcare provider. Document Revised: 08/23/2019 Document Reviewed: 08/23/2019 Elsevier Patient Education  2022 [employer].

## 2020-10-02 ENCOUNTER — Telehealth: Payer: Self-pay | Admitting: Cardiology

## 2020-10-02 NOTE — Telephone Encounter (Signed)
Patient said he has a procedure scheduled for 11/08/20 and wanted to ash Sherri a few questions. Please call the patient

## 2020-10-03 NOTE — Telephone Encounter (Signed)
Left message to call back  

## 2020-10-08 ENCOUNTER — Telehealth: Payer: Self-pay | Admitting: Cardiology

## 2020-10-08 NOTE — Telephone Encounter (Signed)
Spoke with patient and informed him Dr. 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 I did offer to write a note stating his appointment is 8/9 and there aren't any earlier appointments. That did not meet the needs of the patient. The patient is still at his PCPs office currently, he will ask them for a note.

## 2020-10-08 NOTE — Telephone Encounter (Signed)
New message:    Patient calling stating that on Saturday he was working and his arm went num after having a muscle spasm. Patient is at his PCP now and they wanted to call to speak with a nurse. Patient also need a note to go back to work

## 2020-10-15 ENCOUNTER — Other Ambulatory Visit: Payer: Self-pay

## 2020-10-15 ENCOUNTER — Ambulatory Visit (INDEPENDENT_AMBULATORY_CARE_PROVIDER_SITE_OTHER): Payer: 59 | Admitting: Cardiology

## 2020-10-15 ENCOUNTER — Encounter: Payer: Self-pay | Admitting: Cardiology

## 2020-10-15 VITALS — BP 160/100 | HR 86 | Ht 72.0 in | Wt 234.0 lb

## 2020-10-15 DIAGNOSIS — I5042 Chronic combined systolic (congestive) and diastolic (congestive) heart failure: Secondary | ICD-10-CM

## 2020-10-15 DIAGNOSIS — I10 Essential (primary) hypertension: Secondary | ICD-10-CM

## 2020-10-15 DIAGNOSIS — I420 Dilated cardiomyopathy: Secondary | ICD-10-CM | POA: Diagnosis not present

## 2020-10-15 MED ORDER — ISOSORBIDE MONONITRATE ER 30 MG PO TB24: 30.0000 mg | ORAL_TABLET | Freq: Every day | ORAL | 3 refills | Status: DC

## 2020-10-15 NOTE — Progress Notes (Signed)
Cardiology Office Note:    Date:  10/15/2020   ID:  [name], DOB 11-Dec-1970, MRN [id number]  PCP:  [facility]  Cardiologist:  Gypsy Balsam, MD    Referring MD: [facility]   Chief Complaint  Patient presents with   L arm pain    Since 10/05/2020    History of Present Illness:    Jeffery Glenn is a 50 y.o. male with past medical history significant for nonischemic cardiomyopathy, essential hypertension which is difficult to control, congestive heart failure, dyspnea on exertion. He comes today 2 months of follow-up overall he is doing well denies have any chest pain tightness squeezing pressure burning chest complaint is pain in the left side with tingling to his finger clearly not cardiac related with happening with certain motion of the arm.  In the meantime he did see our EP team and he is already scheduled to have ICD implant on 2 of September.  Past Medical History:  Diagnosis Date   Atypical chest pain 05/17/2018   Cardiomyopathy (HCC) ejection fraction 25 to 30% in February 2020, improvement to 30 to 35% in January 2021 06/09/2018   Ejection fraction 25 to 30% on echocardiogram from February 2020   Congestive heart failure (CHF) (HCC) 05/06/2018   Dyspnea on exertion 05/17/2018   Essential hypertension 05/17/2018   Hypertension     Past Surgical History:  Procedure Laterality Date   HAND SURGERY     NASAL SEPTUM SURGERY      Current Medications: Current Meds  Medication Sig   amLODipine (NORVASC) 10 MG tablet Take 1 tablet (10 mg total) by mouth daily.   atorvastatin (LIPITOR) 20 MG tablet Take 1 tablet (20 mg total) by mouth daily.   Benzocaine-Menthol (CEPACOL SORE THROAT MAX NUMB) 15-4 MG LOZG Use as directed 1 lozenge in the mouth or throat every 2 (two) hours as needed (for sore throat).   carvedilol (COREG) 25 MG tablet Take 1 tablet (25 mg total) by mouth 2 (two) times daily.   fluticasone (FLONASE) 50 MCG/ACT nasal  spray Place 1-2 sprays into both nostrils as needed for allergies or rhinitis.   furosemide (LASIX) 40 MG tablet Take 1 tablet (40 mg total) by mouth daily.   hydrALAZINE (APRESOLINE) 50 MG tablet Take 1 tablet (50 mg total) by mouth 3 (three) times daily.   ketoconazole (NIZORAL) 2 % shampoo Apply 1 application topically as directed.   loratadine (CLARITIN) 10 MG tablet Take 10 mg by mouth daily as needed for allergies.   sacubitril-valsartan (ENTRESTO) 97-103 MG Take 1 tablet by mouth 2 (two) times daily.   spironolactone (ALDACTONE) 25 MG tablet Take 0.5 tablets (12.5 mg total) by mouth daily.     Allergies:   Shellfish allergy and Peanut-containing drug products   Social History   Socioeconomic History   Marital status: Married    Spouse name: Not on file   Number of children: Not on file   Years of education: Not on file   Highest education level: Not on file  Occupational History   Not on file  Tobacco Use   Smoking status: Never   Smokeless tobacco: Never  Vaping Use   Vaping Use: Never used  Substance and Sexual Activity   Alcohol use: Yes   Drug use: No   Sexual activity: Not on file  Other Topics Concern   Not on file  Social History Narrative   Not on file   Social  Determinants of Health   Financial Resource Strain: Not on file  Food Insecurity: Not on file  Transportation Needs: Not on file  Physical Activity: Not on file  Stress: Not on file  Social Connections: Not on file     Family History: The patient's family history includes Heart attack in his father and mother; Hypertension in his father and mother. ROS:   Please see the history of present illness.    All 14 point review of systems negative except as described per history of present illness  EKGs/Labs/Other Studies Reviewed:      Recent Labs: 04/23/2020: B Natriuretic Peptide 495.6; BUN 16; Creatinine, Ser 1.13; Hemoglobin 12.8; Magnesium 2.0; Platelets 226; Potassium 3.3; Sodium 140   Recent Lipid Panel    Component Value Date/Time   CHOL 184 01/31/2008 2114   TRIG 62 01/31/2008 2114   HDL 52 01/31/2008 2114   CHOLHDL 3.5 Ratio 01/31/2008 2114   VLDL 12 01/31/2008 2114   LDLCALC 120 (H) 01/31/2008 2114    Physical Exam:    VS:  BP (!) 160/100 (BP Location: Right Arm, Patient Position: Sitting)   Pulse 86   Ht 6' (1.829 m)   Wt 234 lb (106.1 kg)   SpO2 98%   BMI 31.74 kg/m     Wt Readings from Last 3 Encounters:  10/15/20 234 lb (106.1 kg)  09/30/20 230 lb (104.3 kg)  09/08/20 231 lb 14.8 oz (105.2 kg)     GEN:  Well nourished, well developed in no acute distress HEENT: Normal NECK: No JVD; No carotid bruits LYMPHATICS: No lymphadenopathy CARDIAC: RRR, no murmurs, no rubs, no gallops RESPIRATORY:  Clear to auscultation without rales, wheezing or rhonchi  ABDOMEN: Soft, non-tender, non-distended MUSCULOSKELETAL:  No edema; No deformity  SKIN: Warm and dry LOWER EXTREMITIES: no swelling NEUROLOGIC:  Alert and oriented x 3 PSYCHIATRIC:  Normal affect   ASSESSMENT:    1. Dilated cardiomyopathy (HCC)   2. Chronic combined systolic and diastolic congestive heart failure (HCC)   3. Essential hypertension    PLAN:    In order of problems listed above:  Dilated cardiomyopathy.  He is on appropriate medications.  We will continue present management Essential hypertension: Still uncontrolled I will add isosorbide mononitrate 30 mg to his medical regimen there for blood pressure reduction as well as for hope that his heart function will improve.  I stressed 1 more time importance of taking all medications. Congestive heart failure he appears to be euvolemic and compensated on the physical exam, he is [employer] [place] [employer] Association/2 He wants to come back to work I expressed my reservations about it however he told me that he is at work and consistent on operating buttons.  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  And he has been working on those  machines during all times I been taking care of him.  Therefore I think we can let him go to work.  He does not have any chest pain tightness squeezing pressure burning chest there is no dizziness or passing out.  I warned him if he develop any of the symptoms he need to stop and let me know   Medication Adjustments/Labs and Tests Ordered: Current medicines are reviewed at length with the patient today.  Concerns regarding medicines are outlined above.  No orders of the defined types were placed in this encounter.  Medication changes: No orders of the defined types were placed in this encounter.   Signed, Georgeanna Lea,  MD, [facility] 10/15/2020 1:51 PM    [facility]

## 2020-10-15 NOTE — Patient Instructions (Signed)
Medication Instructions:  Your physician has recommended you make the following change in your medication:  START: Imdur 30 mg once daily  *If you need a refill on your cardiac medications before your next appointment, please call your pharmacy*   Lab Work: None If you have labs (blood work) drawn today and your tests are completely normal, you will receive your results only by: MyChart Message (if you have MyChart) OR A paper copy in the mail If you have any lab test that is abnormal or we need to change your treatment, we will call you to review the results.   Testing/Procedures: None   Follow-Up: At [facility], you and your health needs are our priority.  As part of our continuing mission to provide you with exceptional heart care, we have created designated Provider Care Teams.  These Care Teams include your primary Cardiologist (physician) and Advanced Practice Providers (APPs -  Physician Assistants and Nurse Practitioners) who all work together to provide you with the care you need, when you need it.  We recommend signing up for the patient portal called "MyChart".  Sign up information is provided on this After Visit Summary.  MyChart is used to connect with patients for Virtual Visits (Telemedicine).  Patients are able to view lab/test results, encounter notes, upcoming appointments, etc.  Non-urgent messages can be sent to your provider as well.   To learn more about what you can do with MyChart, go to [email or web address].    Your next appointment:   3 month(s)  The format for your next appointment:   In Person  Provider:   Gypsy Balsam, MD   Other Instructions

## 2020-10-21 NOTE — Telephone Encounter (Signed)
Left message to call back  

## 2020-11-01 ENCOUNTER — Telehealth: Payer: Self-pay

## 2020-11-01 DIAGNOSIS — I420 Dilated cardiomyopathy: Secondary | ICD-10-CM

## 2020-11-01 NOTE — Telephone Encounter (Signed)
Returned call to Pt.  Pt will get lab work today at [employer].  Pt requesting how many days off after procedure.  Advised per Dr. Elberta Fortis 7 days.  Pt states he is going to ask for 10 days to ensure not sore when he returns to work.  Work up for procedure complete.

## 2020-11-04 ENCOUNTER — Other Ambulatory Visit: Payer: Self-pay | Admitting: Cardiology

## 2020-11-05 LAB — CBC WITH DIFFERENTIAL/PLATELET
Basophils Absolute: 0 10*3/uL (ref 0.0–0.2)
Basos: 1 %
EOS (ABSOLUTE): 0.5 10*3/uL — ABNORMAL HIGH (ref 0.0–0.4)
Eos: 10 %
Hematocrit: 38.7 % (ref 37.5–51.0)
Hemoglobin: 12.7 g/dL — ABNORMAL LOW (ref 13.0–17.7)
Immature Grans (Abs): 0 10*3/uL (ref 0.0–0.1)
Immature Granulocytes: 0 %
Lymphocytes Absolute: 2.8 10*3/uL (ref 0.7–3.1)
Lymphs: 58 %
MCH: 26.4 pg — ABNORMAL LOW (ref 26.6–33.0)
MCHC: 32.8 g/dL (ref 31.5–35.7)
MCV: 81 fL (ref 79–97)
Monocytes Absolute: 0.3 10*3/uL (ref 0.1–0.9)
Monocytes: 6 %
Neutrophils Absolute: 1.2 10*3/uL — ABNORMAL LOW (ref 1.4–7.0)
Neutrophils: 25 %
Platelets: 189 10*3/uL (ref 150–450)
RBC: 4.81 x10E6/uL (ref 4.14–5.80)
RDW: 14.6 % (ref 11.6–15.4)
WBC: 4.9 10*3/uL (ref 3.4–10.8)

## 2020-11-05 LAB — BASIC METABOLIC PANEL
BUN/Creatinine Ratio: 15 (ref 9–20)
BUN: 14 mg/dL (ref 6–24)
CO2: 23 mmol/L (ref 20–29)
Calcium: 9 mg/dL (ref 8.7–10.2)
Chloride: 104 mmol/L (ref 96–106)
Creatinine, Ser: 0.93 mg/dL (ref 0.76–1.27)
Glucose: 100 mg/dL — ABNORMAL HIGH (ref 65–99)
Potassium: 4 mmol/L (ref 3.5–5.2)
Sodium: 140 mmol/L (ref 134–144)
eGFR: 101 mL/min/{1.73_m2} (ref >59)

## 2020-11-07 NOTE — Pre-Procedure Instructions (Signed)
Instructed patient on the following items: Arrival time 1130 Nothing to eat or drink after midnight No meds AM of procedure Responsible person to drive you home and stay with you for 24 hrs Wash with special soap night before and morning of procedure  

## 2020-11-08 ENCOUNTER — Ambulatory Visit (HOSPITAL_COMMUNITY)
Admission: RE | Admit: 2020-11-08 | Discharge: 2020-11-08 | Disposition: A | Discharge status: Home / Self Care | Payer: 59 | Attending: Cardiology | Admitting: Cardiology

## 2020-11-08 ENCOUNTER — Ambulatory Visit (HOSPITAL_COMMUNITY): Payer: 59 | Admitting: Anesthesiology

## 2020-11-08 ENCOUNTER — Other Ambulatory Visit: Payer: Self-pay

## 2020-11-08 ENCOUNTER — Ambulatory Visit (HOSPITAL_COMMUNITY): Payer: 59

## 2020-11-08 ENCOUNTER — Ambulatory Visit (HOSPITAL_COMMUNITY)
Admission: RE | Disposition: A | Discharge status: Home / Self Care | Payer: Self-pay | Source: Home / Self Care | Attending: Cardiology

## 2020-11-08 DIAGNOSIS — I428 Other cardiomyopathies: Secondary | ICD-10-CM | POA: Insufficient documentation

## 2020-11-08 DIAGNOSIS — Z91010 Allergy to peanuts: Secondary | ICD-10-CM | POA: Diagnosis not present

## 2020-11-08 DIAGNOSIS — I2510 Atherosclerotic heart disease of native coronary artery without angina pectoris: Secondary | ICD-10-CM | POA: Diagnosis not present

## 2020-11-08 DIAGNOSIS — Z8249 Family history of ischemic heart disease and other diseases of the circulatory system: Secondary | ICD-10-CM | POA: Insufficient documentation

## 2020-11-08 DIAGNOSIS — T82598A Other mechanical complication of other cardiac and vascular devices and implants, initial encounter: Secondary | ICD-10-CM

## 2020-11-08 DIAGNOSIS — Z95818 Presence of other cardiac implants and grafts: Secondary | ICD-10-CM

## 2020-11-08 DIAGNOSIS — I110 Hypertensive heart disease with heart failure: Secondary | ICD-10-CM | POA: Diagnosis not present

## 2020-11-08 DIAGNOSIS — Z91013 Allergy to seafood: Secondary | ICD-10-CM | POA: Insufficient documentation

## 2020-11-08 DIAGNOSIS — I5022 Chronic systolic (congestive) heart failure: Secondary | ICD-10-CM | POA: Diagnosis not present

## 2020-11-08 HISTORY — PX: SUBQ ICD IMPLANT: EP1223

## 2020-11-08 SURGERY — SUBQ ICD IMPLANT
Anesthesia: General

## 2020-11-08 MED ORDER — ROCURONIUM BROMIDE 10 MG/ML (PF) SYRINGE
PREFILLED_SYRINGE | INTRAVENOUS | Status: DC | PRN
  Administered 2020-11-08: 50 mg via INTRAVENOUS
  Administered 2020-11-08: 20 mg via INTRAVENOUS

## 2020-11-08 MED ORDER — ONDANSETRON HCL 4 MG/2ML IJ SOLN
INTRAMUSCULAR | Status: DC | PRN
  Administered 2020-11-08: 4 mg via INTRAVENOUS

## 2020-11-08 MED ORDER — LIDOCAINE 2% (20 MG/ML) 5 ML SYRINGE
INTRAMUSCULAR | Status: DC | PRN
  Administered 2020-11-08: 60 mg via INTRAVENOUS

## 2020-11-08 MED ORDER — NOREPINEPHRINE 4 MG/250ML-% IV SOLN
2.0000 ug/min | INTRAVENOUS | Status: AC
  Administered 2020-11-08: 2 ug/min via INTRAVENOUS

## 2020-11-08 MED ORDER — EPINEPHRINE HCL 5 MG/250ML IV SOLN IN NS
0.5000 ug/min | INTRAVENOUS | Status: DC
  Filled 2020-11-08: qty 250

## 2020-11-08 MED ORDER — DEXAMETHASONE SODIUM PHOSPHATE 10 MG/ML IJ SOLN
INTRAMUSCULAR | Status: DC | PRN
  Administered 2020-11-08: 10 mg via INTRAVENOUS

## 2020-11-08 MED ORDER — CEFAZOLIN SODIUM-DEXTROSE 2-4 GM/100ML-% IV SOLN
2.0000 g | INTRAVENOUS | Status: DC
  Filled 2020-11-08: qty 100

## 2020-11-08 MED ORDER — PHENYLEPHRINE HCL-NACL 20-0.9 MG/250ML-% IV SOLN
25.0000 ug/min | INTRAVENOUS | Status: AC
  Administered 2020-11-08: 25 ug/min via INTRAVENOUS
  Filled 2020-11-08: qty 250

## 2020-11-08 MED ORDER — CEFAZOLIN SODIUM-DEXTROSE 2-3 GM-%(50ML) IV SOLR
INTRAVENOUS | Status: DC | PRN
  Administered 2020-11-08: 2 g via INTRAVENOUS

## 2020-11-08 MED ORDER — SODIUM CHLORIDE 0.9 % IV SOLN
80.0000 mg | INTRAVENOUS | Status: AC
  Administered 2020-11-08: 80 mg
  Filled 2020-11-08: qty 2

## 2020-11-08 MED ORDER — SODIUM CHLORIDE 0.9 % IV SOLN: INTRAVENOUS | Status: DC

## 2020-11-08 MED ORDER — CEFAZOLIN SODIUM-DEXTROSE 1-4 GM/50ML-% IV SOLN
1.0000 g | Freq: Four times a day (QID) | INTRAVENOUS | Status: DC
Start: 2020-11-08 — End: 2020-11-09

## 2020-11-08 MED ORDER — SUGAMMADEX SODIUM 200 MG/2ML IV SOLN
INTRAVENOUS | Status: DC | PRN
  Administered 2020-11-08: 300 mg via INTRAVENOUS

## 2020-11-08 MED ORDER — ACETAMINOPHEN 325 MG PO TABS: 325.0000 mg | ORAL_TABLET | ORAL | Status: DC | PRN

## 2020-11-08 MED ORDER — HEPARIN (PORCINE) IN NACL 1000-0.9 UT/500ML-% IV SOLN
INTRAVENOUS | Status: DC | PRN
  Administered 2020-11-08: 500 mL

## 2020-11-08 MED ORDER — MIDAZOLAM HCL 2 MG/2ML IJ SOLN
INTRAMUSCULAR | Status: DC | PRN
  Administered 2020-11-08: 2 mg via INTRAVENOUS

## 2020-11-08 MED ORDER — CEFAZOLIN SODIUM-DEXTROSE 2-4 GM/100ML-% IV SOLN
INTRAVENOUS | Status: AC
  Filled 2020-11-08: qty 100

## 2020-11-08 MED ORDER — SODIUM CHLORIDE 0.9 % IV SOLN: 250.0000 mL | INTRAVENOUS | Status: DC

## 2020-11-08 MED ORDER — ONDANSETRON HCL 4 MG/2ML IJ SOLN
4.0000 mg | Freq: Four times a day (QID) | INTRAMUSCULAR | Status: DC | PRN
Start: 2020-11-08 — End: 2020-11-09

## 2020-11-08 MED ORDER — FENTANYL CITRATE (PF) 100 MCG/2ML IJ SOLN
INTRAMUSCULAR | Status: DC | PRN
  Administered 2020-11-08: 100 ug via INTRAVENOUS

## 2020-11-08 MED ORDER — BUPIVACAINE HCL (PF) 0.25 % IJ SOLN
INTRAMUSCULAR | Status: AC
  Filled 2020-11-08: qty 90

## 2020-11-08 MED ORDER — POVIDONE-IODINE 10 % EX SWAB: 2.0000 "application " | Freq: Once | CUTANEOUS | Status: DC

## 2020-11-08 MED ORDER — BUPIVACAINE HCL (PF) 0.25 % IJ SOLN
INTRAMUSCULAR | Status: DC | PRN
  Administered 2020-11-08: 90 mL

## 2020-11-08 MED ORDER — SODIUM CHLORIDE 0.9 % IV SOLN
INTRAVENOUS | Status: AC
  Filled 2020-11-08: qty 2

## 2020-11-08 MED ORDER — ETOMIDATE 2 MG/ML IV SOLN
INTRAVENOUS | Status: DC | PRN
  Administered 2020-11-08: 14 mg via INTRAVENOUS

## 2020-11-08 SURGICAL SUPPLY — 6 items
CABLE SURGICAL S-101-97-12 (CABLE) ×2 IMPLANT
ICD SUBCU MRI EMBLEM A219 (ICD Generator) ×1 IMPLANT
LEAD SUBQU EMBLEM 3501 (Pacemaker) ×1 IMPLANT
MAT PREVALON FULL STRYKER (MISCELLANEOUS) ×1 IMPLANT
PAD PRO RADIOLUCENT 2001M-C (PAD) ×3 IMPLANT
TRAY PACEMAKER INSERTION (PACKS) ×2 IMPLANT

## 2020-11-08 NOTE — Anesthesia Preprocedure Evaluation (Addendum)
Anesthesia Evaluation  Patient identified by MRN, date of birth, ID band Patient awake    Reviewed: Allergy & Precautions, NPO status , Patient's Chart, lab work & pertinent test results, reviewed documented beta blocker date and time   History of Anesthesia Complications Negative for: history of anesthetic complications  Airway Mallampati: II  TM Distance: >3 FB Neck ROM: Full    Dental  (+) Dental Advisory Given, Chipped   Pulmonary neg pulmonary ROS,    Pulmonary exam normal        Cardiovascular hypertension, Pt. on medications and Pt. on home beta blockers +CHF  Normal cardiovascular exam   '22 TTE - EF 20 to 25%. Global hypokinesis. The left ventricular internal cavity size was severely dilated. Moderate concentric left ventricular hypertrophy. Grade I diastolic dysfunction (impaired relaxation). Right ventricular systolic function is mildly reduced. The right  ventricular size is mildly enlarged. There is normal pulmonary artery systolic pressure. Left atrial size was severely dilated. Mild mitral valve regurgitation.    Neuro/Psych negative neurological ROS  negative psych ROS   GI/Hepatic negative GI ROS, Neg liver ROS,   Endo/Other   Obesity   Renal/GU negative Renal ROS     Musculoskeletal negative musculoskeletal ROS (+)   Abdominal   Peds  Hematology negative hematology ROS (+)   Anesthesia Other Findings   Reproductive/Obstetrics                            Anesthesia Physical Anesthesia Plan  ASA: 4  Anesthesia Plan: General   Post-op Pain Management:    Induction: Intravenous  PONV Risk Score and Plan: 2 and Treatment may vary due to age or medical condition, Ondansetron, Dexamethasone and Midazolam  Airway Management Planned: LMA  Additional Equipment:   Intra-op Plan:   Post-operative Plan: Extubation in OR  Informed Consent: I have reviewed the patients  History and Physical, chart, labs and discussed the procedure including the risks, benefits and alternatives for the proposed anesthesia with the patient or authorized representative who has indicated his/her understanding and acceptance.     Dental advisory given  Plan Discussed with: CRNA, Anesthesiologist and Surgeon  Anesthesia Plan Comments: ([facility] )       Anesthesia Quick Evaluation

## 2020-11-08 NOTE — H&P (Signed)
Electrophysiology Office Note   Date:  11/08/2020   ID:  Jeffery Glenn, DOB [birth year], MRN [id number]  PCP:  [facility]  Cardiologist:  Bing Matter Primary Electrophysiologist:  Abhiram Criado Jorja Loa, MD    Chief Complaint: CHF   History of Present Illness: Jeffery Glenn is a 50 y.o. male who is being seen today for the evaluation of CHF at the request of No ref. provider found. Presenting today for electrophysiology evaluation.  He has a history of nonischemic cardiomyopathy and hypertension.  He had a cardiac CT that showed no evidence of coronary artery disease.  Today, denies symptoms of palpitations, chest pain, shortness of breath, orthopnea, PND, lower extremity edema, claudication, dizziness, presyncope, syncope, bleeding, or neurologic sequela. The patient is tolerating medications without difficulties.  His main complaint is fatigue.  He does not have much shortness of breath.  He states that he could fall asleep at most times the day.  Aside from that, he is well and is without complaint.  Unfortunately his ejection fraction has not improved.  He is ready for.  ICD implant.   Past Medical History:  Diagnosis Date   Atypical chest pain 05/17/2018   Cardiomyopathy (HCC) ejection fraction 25 to 30% in February 2020, improvement to 30 to 35% in January 2021 06/09/2018   Ejection fraction 25 to 30% on echocardiogram from February 2020   Congestive heart failure (CHF) (HCC) 05/06/2018   Dyspnea on exertion 05/17/2018   Essential hypertension 05/17/2018   Hypertension    Past Surgical History:  Procedure Laterality Date   HAND SURGERY     NASAL SEPTUM SURGERY       Current Facility-Administered Medications  Medication Dose Route Frequency Provider Last Rate Last Admin   0.9 %  sodium chloride infusion   Intravenous Continuous Jeffery Abboud Daphine Deutscher, MD 50 mL/hr at 11/08/20 1140 New Bag at 11/08/20 1140   0.9 %  sodium chloride infusion  250 mL Intravenous  Continuous Beryle Lathe, MD       ceFAZolin (ANCEF) IVPB 2g/100 mL premix  2 g Intravenous To Cath Anita Mcadory Daphine Deutscher, MD       EPINEPHrine (ADRENALIN) 5 mg in NS 250 mL (0.02 mg/mL) premix infusion  0.5-20 mcg/min Intravenous To OR Beryle Lathe, MD       gentamicin (GARAMYCIN) 80 mg in sodium chloride 0.9 % 500 mL irrigation  80 mg Irrigation To Cath Kalyan Barabas Daphine Deutscher, MD       norepinephrine (LEVOPHED) 4mg  in premix infusion  2-10 mcg/min Intravenous To OR , MD       phenylephrine (NEO-SYNEPHRINE) 20mg /NS Beryle Lathe premix infusion  25-200 mcg/min Intravenous To OR , MD       povidone-iodine 10 % swab 2 application  2 application Topical Once Marlie Kuennen, , MD        Allergies:   Shellfish allergy and Peanut-containing drug products   Social History:  The patient  reports that he has never smoked. He has never used smokeless tobacco. He reports current alcohol use. He reports that he does not use drugs.   Family History:  The patient's family history includes Heart attack in his father and mother; Hypertension in his father and mother.   ROS:  Please see the history of present illness.   Otherwise, review of systems is positive for none.   All other systems are reviewed and negative.   PHYSICAL EXAM: VS:  BP (!) 138/92  Pulse 69   Temp 97.9 F (36.6 C) (Oral)   Resp (!) 21   Ht 6' (1.829 m)   Wt 105.2 kg   SpO2 96%   BMI 31.46 kg/m  , BMI Body mass index is 31.46 kg/m. GEN: Well nourished, well developed, in no acute distress  HEENT: normal  Neck: no JVD, carotid bruits, or masses Cardiac: RRR; no murmurs, rubs, or gallops,no edema  Respiratory:  clear to auscultation bilaterally, normal work of breathing GI: soft, nontender, nondistended, + BS MS: no deformity or atrophy  Skin: warm and dry Neuro:  Strength and sensation are intact Psych: euthymic mood, full affect  EKG:  EKG is ordered today. Personal review of the ekg  ordered shows sinus rhythm, right superior axis, LVH, rate 81  Recent Labs: 04/23/2020: B Natriuretic Peptide 495.6; Magnesium 2.0 11/04/2020: BUN 14; Creatinine, Ser 0.93; Hemoglobin 12.7; Platelets 189; Potassium 4.0; Sodium 140    Lipid Panel     Component Value Date/Time   CHOL 184 01/31/2008 2114   TRIG 62 01/31/2008 2114   HDL 52 01/31/2008 2114   CHOLHDL 3.5 Ratio 01/31/2008 2114   VLDL 12 01/31/2008 2114   LDLCALC 120 (H) 01/31/2008 2114     Wt Readings from Last 3 Encounters:  11/08/20 105.2 kg  10/15/20 106.1 kg  09/30/20 104.3 kg      Other studies Reviewed: Additional studies/ records that were reviewed today include: TTE 09/18/2020 Review of the above records today demonstrates:   1. Left ventricular ejection fraction, by estimation, is 20 to 25%. The  left ventricle has severely decreased function. The left ventricle  demonstrates global hypokinesis. The left ventricular internal cavity size  was severely dilated. There is moderate   concentric left ventricular hypertrophy. Left ventricular diastolic  parameters are consistent with Grade I diastolic dysfunction (impaired  relaxation).   2. Right ventricular systolic function is mildly reduced. The right  ventricular size is mildly enlarged. There is normal pulmonary artery  systolic pressure.   3. Left atrial size was severely dilated.   4. The mitral valve is normal in structure. Mild mitral valve  regurgitation. No evidence of mitral stenosis.   5. The aortic valve is tricuspid. Aortic valve regurgitation is not  visualized. No aortic stenosis is present.   6. The inferior vena cava is normal in size with greater than 50%  respiratory variability, suggesting right atrial pressure of 3 mmHg.   Coronary CTA 06/15/19 1. Coronary calcium score of 0. This was 0 percentile for age and sex matched control. 2. Normal coronary origin with right dominance.  Ramus branch noted. 3. No evidence of CAD. 4. There is  left ventricular hypertrophy (20 mm septum and 16 mm lateral wall). 5. Non ischemic cardiomyopathy, probably hypertensive in etiology.  ASSESSMENT AND PLAN:  1.  Chronic systolic heart failure due to nonischemic cardiomyopathy: ICD Criteria  Current LVEF:20%. Within 12 months prior to implant: Yes   Heart failure history: Yes, Class II  Cardiomyopathy history: Yes, Non-Ischemic Cardiomyopathy.  Atrial Fibrillation/Atrial Flutter: No.  Ventricular tachycardia history: No.  Cardiac arrest history: No.  History of syndromes with risk of sudden death: No.  Previous ICD: No.  Current ICD indication: Primary  PPM indication: No.  Class I or II Bradycardia indication present: No  Beta Blocker therapy for 3 or more months: Yes, prescribed.   Ace Inhibitor/ARB therapy for 3 or more months: Yes, prescribed.    I have seen Jestin Burbach is a 50 y.o.  malepre-procedural and has been referred by [facility] for consideration of ICD implant for primary prevention of sudden death.  The patient's chart has been reviewed and they meet criteria for ICD implant.  I have had a thorough discussion with the patient reviewing options.  The patient and their family (if available) have had opportunities to ask questions and have them answered. The patient and I have decided together through the [facility] Share Decision Support Tool to implant ICD at this time.  Risks, benefits, alternatives to ICD implantation were discussed in detail with the patient today. The patient  understands that the risks include but are not limited to bleeding, infection, pneumothorax, perforation, tamponade, vascular damage, renal failure, MI, stroke, death, inappropriate shocks, and lead dislodgement and  wishes to proceed.

## 2020-11-08 NOTE — Discharge Instructions (Signed)
    Supplemental Discharge Instructions for  Pacemaker/Defibrillator Patients   Activity No heavy lifting or vigorous activity with your left arm until wound is completely healed  NO DRIVING until cleared to at your wound check visit.  WOUND CARE Keep the wound area clean and dry.  Do not get this area wet , no showers until cleared to at your wound check visit Tomorrow, 11/09/20 remove the outer plastic bandages.  Underneath the plastic bandage there are steri strips (paper tapes), DO NOT remove these. The tape/steri-strips on your wound will fall off; do not pull them off.  No bandage is needed on the site.  DO  NOT apply any creams, oils, or ointments to the wound area. If you notice any drainage or discharge from the wound, any swelling or bruising at the site, or you develop a fever > 101? F after you are discharged home, call the office at once.  Special Instructions You are still able to use cellular telephones; use the ear opposite the side where you have your pacemaker/defibrillator.  Avoid carrying your cellular phone near your device. When traveling through airports, show security personnel your identification card to avoid being screened in the metal detectors.  Ask the security personnel to use the hand wand. Avoid arc welding equipment, MRI testing (magnetic resonance imaging), TENS units (transcutaneous nerve stimulators).  Call the office for questions about other devices. Avoid electrical appliances that are in poor condition or are not properly grounded. Microwave ovens are safe to be near or to operate.  Additional information for defibrillator patients should your device go off: If your device goes off ONCE and you feel fine afterward, notify the device clinic nurses. If your device goes off ONCE and you do not feel well afterward, call 911. If your device goes off TWICE, call 911. If your device goes off THREE times in one day, call 911.  DO NOT DRIVE YOURSELF OR A  FAMILY MEMBER WITH A DEFIBRILLATOR TO THE HOSPITAL--CALL 911.

## 2020-11-08 NOTE — Anesthesia Procedure Notes (Signed)
Procedure Name: Intubation Date/Time: 11/08/2020 2:30 PM Performed by: Lavell Luster, CRNA Pre-anesthesia Checklist: Patient identified, Emergency Drugs available, Suction available, Patient being monitored and Timeout performed Patient Re-evaluated:Patient Re-evaluated prior to induction Oxygen Delivery Method: Circle system utilized Preoxygenation: Pre-oxygenation with 100% oxygen Induction Type: IV induction Laryngoscope Size: Mac, 4 and Glidescope Grade View: Grade I Tube type: Oral Tube size: 7.5 mm Number of attempts: 1 Airway Equipment and Method: Stylet Placement Confirmation: ETT inserted through vocal cords under direct vision, positive ETCO2 and breath sounds checked- equal and bilateral Secured at: 22 cm Tube secured with: Tape Dental Injury: Teeth and Oropharynx as per pre-operative assessment

## 2020-11-08 NOTE — Transfer of Care (Signed)
Immediate Anesthesia Transfer of Care Note  Patient: Jeffery Glenn  Procedure(s) Performed: SUBQ ICD IMPLANT  Patient Location: Cath Lab  Anesthesia Type:General  Level of Consciousness: awake, alert  and oriented  Airway & Oxygen Therapy: Patient connected to face mask oxygen  Post-op Assessment: Post -op Vital signs reviewed and stable  Post vital signs: stable  Last Vitals:  Vitals Value Taken Time  BP    Temp    Pulse    Resp    SpO2      Last Pain:  Vitals:   11/08/20 1136  TempSrc:   PainSc: 0-No pain      Patients Stated Pain Goal: 3 (11/08/20 1136)  Complications: There were no known notable events for this encounter.

## 2020-11-09 MED FILL — Cefazolin Sodium-Dextrose IV Solution 2 GM/100ML-4%: INTRAVENOUS | Qty: 100 | Status: AC

## 2020-11-11 NOTE — Anesthesia Postprocedure Evaluation (Signed)
Anesthesia Post Note  Patient: Jeffery Glenn  Procedure(s) Performed: SUBQ ICD IMPLANT     Patient location during evaluation: Cath Lab Anesthesia Type: General Level of consciousness: awake and alert Pain management: pain level controlled Vital Signs Assessment: post-procedure vital signs reviewed and stable Respiratory status: spontaneous breathing, nonlabored ventilation, respiratory function stable and patient connected to nasal cannula oxygen Cardiovascular status: blood pressure returned to baseline and stable Postop Assessment: no apparent nausea or vomiting Anesthetic complications: no   There were no known notable events for this encounter.  Last Vitals:  Vitals:   11/08/20 1818 11/08/20 1844  BP: 120/81 130/90  Pulse: (!) 59 64  Resp: 11 11  Temp:    SpO2: 99% 98%    Last Pain:  Vitals:   11/08/20 1730  TempSrc:   PainSc: 0-No pain                 Rishabh Rinkenberger

## 2020-11-12 ENCOUNTER — Encounter (HOSPITAL_COMMUNITY): Payer: Self-pay | Admitting: Cardiology

## 2020-11-15 ENCOUNTER — Telehealth: Payer: Self-pay | Admitting: Cardiology

## 2020-11-15 ENCOUNTER — Encounter: Payer: Self-pay | Admitting: *Deleted

## 2020-11-15 NOTE — Telephone Encounter (Signed)
Patient states he is returning a call he assumes was from Dr. Elberta Fortis' nurse.

## 2020-11-18 NOTE — Telephone Encounter (Signed)
Pt confirms that wife picked up paperwork from [facility] office today and dropped it off at [place] office to have completed/signed by [employer].

## 2020-11-20 ENCOUNTER — Other Ambulatory Visit: Payer: Self-pay

## 2020-11-20 ENCOUNTER — Ambulatory Visit: Payer: 59

## 2020-11-20 DIAGNOSIS — I420 Dilated cardiomyopathy: Secondary | ICD-10-CM

## 2020-11-20 NOTE — Patient Instructions (Signed)
   After Your ICD (Implantable Cardiac Defibrillator)    Monitor your defibrillator site for redness, swelling, and drainage. Call the device clinic at [phone] if you experience these symptoms or fever/chills.  Your incision was closed with Steri-strips or staples:  You may shower 7 days after your procedure and wash your incision with soap and water. Avoid lotions, ointments, or perfumes over your incision until it is well-healed.    You may use a hot tub or a pool after your wound check appointment if the incision is completely closed.  Do not lift, push or pull greater than 10 pounds with the affected arm until 6 weeks after your procedure. There are no other restrictions in arm movement after your wound check appointment.  Your ICD is MRI compatible.  Your ICD is designed to protect you from life threatening heart rhythms. Because of this, you may receive a shock.   1 shock with no symptoms:  Call the office during business hours. 1 shock with symptoms (chest pain, chest pressure, dizziness, lightheadedness, shortness of breath, overall feeling unwell):  Call 911. If you experience 2 or more shocks in 24 hours:  Call 911. If you receive a shock, you should not drive.  [place] DMV - no driving for 6 months if you receive appropriate therapy from your ICD.   ICD Alerts:  Some alerts are vibratory and others beep. These are NOT emergencies. Please call our office to let us know. If this occurs at night or on weekends, it can wait until the next business day. Send a remote transmission.  If your device is capable of reading fluid status (for heart failure), you will be offered monthly monitoring to review this with you.   Remote monitoring is used to monitor your ICD from home. This monitoring is scheduled every 91 days by our office. It allows us to keep an eye on the functioning of your device to ensure it is working properly. You will routinely see your Electrophysiologist annually  (more often if necessary).   

## 2020-11-21 NOTE — Progress Notes (Signed)
Wound check appointment. Steri-strips removed. Wound without redness or edema. Incision edges approximated, wound well healed. Normal device function. Subcutaneous ICD check in clinic. 0 untreated episodes; 0 treated episodes; 0 shocks delivered. Electrode impedance status okay. No programming changes. Remaining longevity to ERI 100%. Patient enrolled in remote monitoring with next transmission scheduled 02/12/21. 91 day follow up with Dr. Elberta Fortis 03/31/20. Error in saving attachment. Error in saving attachment.

## 2020-11-27 ENCOUNTER — Telehealth: Payer: Self-pay

## 2020-11-27 NOTE — Telephone Encounter (Signed)
Patient reports the area around his device feels in his back area. Denies any complaints or symptoms. Remote transmission reviewed  and no therapies delivered. Presenting rhythm ~ SR. Advised patient to follow up with PCP. Verbalized understanding.

## 2020-11-27 NOTE — Telephone Encounter (Signed)
The patient wife states the patient feels like he got shocked. He denies chest pains, dizziness, or SOB. He just feels a hot "spot". He states shock made him jump twice. He mostly felt it in his back.

## 2020-11-28 ENCOUNTER — Telehealth: Payer: Self-pay | Admitting: Cardiology

## 2020-11-28 DIAGNOSIS — Z0279 Encounter for issue of other medical certificate: Secondary | ICD-10-CM

## 2020-11-28 NOTE — Telephone Encounter (Signed)
FMLA paperwork was dropped off on 11/18/2020. Paperwork has been completed by Dr. Elberta Fortis on 11/25/2020 and faxed to [employer] [place]. Patient was notified on 11/27/2020 that the paperwork was ready for pick. Patient picked up paperwork on 11/28/2020. JMM 11/28/2020

## 2020-11-30 ENCOUNTER — Other Ambulatory Visit: Payer: Self-pay

## 2020-11-30 ENCOUNTER — Emergency Department (HOSPITAL_BASED_OUTPATIENT_CLINIC_OR_DEPARTMENT_OTHER)
Admission: EM | Admit: 2020-11-30 | Discharge: 2020-11-30 | Disposition: A | Discharge status: Home / Self Care | Payer: 59 | Attending: Emergency Medicine | Admitting: Emergency Medicine

## 2020-11-30 ENCOUNTER — Emergency Department (HOSPITAL_BASED_OUTPATIENT_CLINIC_OR_DEPARTMENT_OTHER): Payer: 59

## 2020-11-30 ENCOUNTER — Encounter (HOSPITAL_BASED_OUTPATIENT_CLINIC_OR_DEPARTMENT_OTHER): Payer: Self-pay | Admitting: Emergency Medicine

## 2020-11-30 DIAGNOSIS — R079 Chest pain, unspecified: Secondary | ICD-10-CM

## 2020-11-30 DIAGNOSIS — I110 Hypertensive heart disease with heart failure: Secondary | ICD-10-CM | POA: Insufficient documentation

## 2020-11-30 DIAGNOSIS — T82897A Other specified complication of cardiac prosthetic devices, implants and grafts, initial encounter: Secondary | ICD-10-CM | POA: Diagnosis not present

## 2020-11-30 DIAGNOSIS — Z79899 Other long term (current) drug therapy: Secondary | ICD-10-CM | POA: Diagnosis not present

## 2020-11-30 DIAGNOSIS — Z950 Presence of cardiac pacemaker: Secondary | ICD-10-CM

## 2020-11-30 DIAGNOSIS — I509 Heart failure, unspecified: Secondary | ICD-10-CM | POA: Insufficient documentation

## 2020-11-30 DIAGNOSIS — Y658 Other specified misadventures during surgical and medical care: Secondary | ICD-10-CM | POA: Diagnosis not present

## 2020-11-30 DIAGNOSIS — Z91010 Allergy to peanuts: Secondary | ICD-10-CM | POA: Insufficient documentation

## 2020-11-30 LAB — CBC WITH DIFFERENTIAL/PLATELET
Abs Immature Granulocytes: 0 10*3/uL (ref 0.00–0.07)
Basophils Absolute: 0 10*3/uL (ref 0.0–0.1)
Basophils Relative: 1 %
Eosinophils Absolute: 0.3 10*3/uL (ref 0.0–0.5)
Eosinophils Relative: 7 %
HCT: 40.6 % (ref 39.0–52.0)
Hemoglobin: 13.4 g/dL (ref 13.0–17.0)
Immature Granulocytes: 0 %
Lymphocytes Relative: 55 %
Lymphs Abs: 2.4 10*3/uL (ref 0.7–4.0)
MCH: 27.2 pg (ref 26.0–34.0)
MCHC: 33 g/dL (ref 30.0–36.0)
MCV: 82.4 fL (ref 80.0–100.0)
Monocytes Absolute: 0.6 10*3/uL (ref 0.1–1.0)
Monocytes Relative: 13 %
Neutro Abs: 1 10*3/uL — ABNORMAL LOW (ref 1.7–7.7)
Neutrophils Relative %: 24 %
Platelets: 202 10*3/uL (ref 150–400)
RBC: 4.93 MIL/uL (ref 4.22–5.81)
RDW: 16.5 % — ABNORMAL HIGH (ref 11.5–15.5)
WBC: 4.3 10*3/uL (ref 4.0–10.5)
nRBC: 0 % (ref 0.0–0.2)

## 2020-11-30 LAB — COMPREHENSIVE METABOLIC PANEL
ALT: 13 U/L (ref 0–44)
AST: 16 U/L (ref 15–41)
Albumin: 4.2 g/dL (ref 3.5–5.0)
Alkaline Phosphatase: 35 U/L — ABNORMAL LOW (ref 38–126)
Anion gap: 8 (ref 5–15)
BUN: 15 mg/dL (ref 6–20)
CO2: 27 mmol/L (ref 22–32)
Calcium: 9.2 mg/dL (ref 8.9–10.3)
Chloride: 104 mmol/L (ref 98–111)
Creatinine, Ser: 0.88 mg/dL (ref 0.61–1.24)
GFR, Estimated: >60 mL/min (ref >60)
Glucose, Bld: 94 mg/dL (ref 70–99)
Potassium: 3.7 mmol/L (ref 3.5–5.1)
Sodium: 139 mmol/L (ref 135–145)
Total Bilirubin: 0.8 mg/dL (ref 0.3–1.2)
Total Protein: 7.6 g/dL (ref 6.5–8.1)

## 2020-11-30 LAB — TROPONIN I (HIGH SENSITIVITY): Troponin I (High Sensitivity): 24 ng/L — ABNORMAL HIGH (ref <18)

## 2020-11-30 NOTE — ED Provider Notes (Signed)
[facility] EMERGENCY DEPARTMENT Provider Note   CSN: [id number] Arrival date & time: 11/30/20  1411     History Chief Complaint  Patient presents with   Post-op Problem    Jeffery Glenn is a 50 y.o. male history of heart failure with EF of 30%, AICD, here presenting with warmness around the AICD extraction site. Patient states that he had it placed by Dr. Irven Shelling on 9/2 for heart failure. Patient states that he had a postop visit afterwards and the device was working fine.  He states that he has been more active and he has been removing his arm more.  He states that since yesterday he had a warm sensation of the left chest wall area where the pacemaker was. Patient denies being shocked by the AICD. Denies any chest pain. Denies any fever    The history is provided by the patient.      Past Medical History:  Diagnosis Date   Atypical chest pain 05/17/2018   Cardiomyopathy (HCC) ejection fraction 25 to 30% in February 2020, improvement to 30 to 35% in January 2021 06/09/2018   Ejection fraction 25 to 30% on echocardiogram from February 2020   Congestive heart failure (CHF) (HCC) 05/06/2018   Dyspnea on exertion 05/17/2018   Essential hypertension 05/17/2018   Hypertension     Patient Active Problem List   Diagnosis Date Noted   Hypertension    Cardiomyopathy (HCC) ejection fraction 25 to 30% in February 2020, improvement to 30 to 35% in January 2021 06/09/2018   Atypical chest pain 05/17/2018   Essential hypertension 05/17/2018   Congestive heart failure (CHF) (HCC) 05/06/2018    Past Surgical History:  Procedure Laterality Date   HAND SURGERY     NASAL SEPTUM SURGERY     SUBQ ICD IMPLANT N/A 11/08/2020   Procedure: SUBQ ICD IMPLANT;  Surgeon: Regan Lemming, MD;  Location: [facility] INVASIVE CV LAB;  Service: Cardiovascular;  Laterality: N/A;       Family History  Problem Relation Age of Onset   Heart attack Mother    Hypertension Mother    Heart attack Father     Hypertension Father     Social History   Tobacco Use   Smoking status: Never   Smokeless tobacco: Never  Vaping Use   Vaping Use: Never used  Substance Use Topics   Alcohol use: Yes   Drug use: No    Home Medications Prior to Admission medications   Medication Sig Start Date End Date Taking? Authorizing Provider  amLODipine (NORVASC) 10 MG tablet Take 1 tablet (10 mg total) by mouth daily. 03/05/20   Georgeanna Lea, MD  atorvastatin (LIPITOR) 20 MG tablet Take 1 tablet (20 mg total) by mouth daily. 03/05/20   Georgeanna Lea, MD  Benzocaine-Menthol (CEPACOL SORE THROAT MAX NUMB) 15-4 MG LOZG Use as directed 1 lozenge in the mouth or throat every 2 (two) hours as needed (for sore throat). Patient not taking: Reported on 11/05/2020 09/08/20   Molpus, Jonny Ruiz, MD  carvedilol (COREG) 25 MG tablet Take 1 tablet (25 mg total) by mouth 2 (two) times daily. 03/05/20   Georgeanna Lea, MD  furosemide (LASIX) 40 MG tablet Take 1 tablet (40 mg total) by mouth daily. 03/05/20   Georgeanna Lea, MD  hydrALAZINE (APRESOLINE) 50 MG tablet Take 1 tablet (50 mg total) by mouth 3 (three) times daily. 06/04/20 11/05/20  Georgeanna Lea, MD  isosorbide mononitrate (IMDUR) 30 MG 24 hr  tablet Take 1 tablet (30 mg total) by mouth daily. 10/15/20 01/13/21  Georgeanna Lea, MD  loratadine (CLARITIN) 10 MG tablet Take 10 mg by mouth daily as needed for allergies. 02/27/19   [provider]  sacubitril-valsartan (ENTRESTO) 97-103 MG Take 1 tablet by mouth 2 (two) times daily. 03/05/20   Georgeanna Lea, MD  spironolactone (ALDACTONE) 25 MG tablet Take 0.5 tablets (12.5 mg total) by mouth daily. 03/05/20   Georgeanna Lea, MD    Allergies    Shellfish allergy and Peanut-containing drug products  Review of Systems   Review of Systems  Cardiovascular:  Negative for chest pain.  All other systems reviewed and are negative.  Physical Exam Updated Vital Signs BP (!) 164/113    Pulse 79   Temp 98.3 F (36.8 C) (Oral)   Resp 18   Ht 6' (1.829 m)   Wt 111.1 kg   SpO2 92%   BMI 33.23 kg/m   Physical Exam Vitals and nursing note reviewed.  Constitutional:      Appearance: Normal appearance.  HENT:     Head: Normocephalic.     Nose: Nose normal.     Mouth/Throat:     Mouth: Mucous membranes are moist.  Eyes:     Extraocular Movements: Extraocular movements intact.     Pupils: Pupils are equal, round, and reactive to light.  Cardiovascular:     Rate and Rhythm: Normal rate and regular rhythm.     Pulses: Normal pulses.  Pulmonary:     Effort: Pulmonary effort is normal.     Breath sounds: Normal breath sounds.     Comments: Able to palpate pacemaker site. Incision is healing well, no obvious signs of cellulitis, no fluctuance  Abdominal:     General: Abdomen is flat.     Palpations: Abdomen is soft.  Musculoskeletal:        General: Normal range of motion.     Cervical back: Normal range of motion.  Skin:    General: Skin is warm.     Capillary Refill: Capillary refill takes less than 2 seconds.  Neurological:     General: No focal deficit present.     Mental Status: He is alert.  Psychiatric:        Mood and Affect: Mood normal.    ED Results / Procedures / Treatments   Labs (all labs ordered are listed, but only abnormal results are displayed) Labs Reviewed  CBC WITH DIFFERENTIAL/PLATELET  COMPREHENSIVE METABOLIC PANEL  TROPONIN I (HIGH SENSITIVITY)    EKG None  Radiology No results found.  Procedures Procedures   Medications Ordered in ED Medications - No data to display  ED Course  I have reviewed the triage vital signs and the nursing notes.  Pertinent labs & imaging results that were available during my care of the patient were reviewed by me and considered in my medical decision making (see chart for details).    MDM Rules/Calculators/A&P                          Jeffery Glenn is a 50 y.o. male here with warm  sensation around pacemaker site. It was placed about 3 weeks ago. No signs of infection. Patient didn't get shocked. Will get labs, CXR. Will attempt to interrogate pacemaker    7:11 PM WBC is 4.3.  Troponin is baseline at 24.  Chest x-ray showed the pacemaker is in place.  [employer]  is unable to interrogate remotely.  However patient has a transmitter at home.  Since he did not shocked him, I think he can transmit it at home.  I do not see any signs of infection.  Stable for discharge    Final Clinical Impression(s) / ED Diagnoses Final diagnoses:  None    Rx / DC Orders ED Discharge Orders     None        Charlynne Pander, MD 11/30/20 [phone]

## 2020-11-30 NOTE — ED Notes (Signed)
Pt A&OX4 ambulatory at d/c with independent steady gait NAD

## 2020-11-30 NOTE — ED Triage Notes (Signed)
Pt had SICD implanted 11/08/20; pt reports hot feeling to site of insertion off and on since yesterday; denies CP or other sxs; no obvious abnormality to site

## 2020-11-30 NOTE — Discharge Instructions (Signed)
Your pacemaker appears to be in place there is no signs of infection right now for  Please transmit the pacemaker when you get home.  See your cardiologist on Monday  Return to ER if you have worse pain in your chest, pain around the pacemaker or any swelling or signs of infection.

## 2020-11-30 NOTE — ED Notes (Signed)
[employer] back to ask about delay, the callback number they had was incorrect, they stated it would be 30-45 minutes for a call back.  Provider notified.

## 2020-11-30 NOTE — ED Notes (Signed)
[employer] to interrogate ICD, stated local rep would be called and call back with ETA.

## 2020-12-05 ENCOUNTER — Telehealth: Payer: Self-pay | Admitting: Cardiology

## 2020-12-05 NOTE — Telephone Encounter (Signed)
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

## 2020-12-05 NOTE — Telephone Encounter (Signed)
New Message:      Patient had a pacemaker put in on 11-08-20. Wife says he is having pain, burning feeling and numbness in the area where he had the pacemaker put in.

## 2020-12-05 NOTE — Telephone Encounter (Signed)
Spoke to patients wife and offered device clinic apt. 12/06/20 to check ICD subQ. Advised that I can give a note that patient was seen in clinic tomorrow after the visit but not able to give one for other days. That would need to go though the provider. Verbalized understanding.

## 2020-12-05 NOTE — Telephone Encounter (Signed)
  1. Has your device fired? no  2. Is you device beeping? no  3. Are you experiencing draining or swelling at device site?  Pain, burning feeling and numbness where his pacemaker was put in on 11-08-20  4. Are you calling to see if we received your device transmission? no  5. Have you passed out? no    Please route to Device Clinic Pool

## 2020-12-05 NOTE — Telephone Encounter (Signed)
The patient wife Georgeanna Harrison (wife) states the patient feels discomfort, numbness and like it is burning around his pacemaker site.

## 2020-12-06 NOTE — Telephone Encounter (Signed)
The patient wife states they are having car trouble and can not make the appointment. I let him speak with Amy, rn.

## 2020-12-06 NOTE — Telephone Encounter (Signed)
Spoke with pt wife.  She states patient is unable to come for add-on visit today/ wound check due to transportation.  I asked spouse about condition of wound.  She states that the incision is mostly numb, there is a small amount of pain but it is tolerable.  Advised tylenol as directed on bottle for pain as needed.  If that is not effective or pain worsens to notify office.  Also advised close monitoring of incision for s/s of infection.  Can always send a picture vis my chart.    Numbness at incision is normal as the area was cut and needs to heal.

## 2020-12-10 ENCOUNTER — Other Ambulatory Visit: Payer: Self-pay

## 2020-12-10 ENCOUNTER — Encounter (HOSPITAL_BASED_OUTPATIENT_CLINIC_OR_DEPARTMENT_OTHER): Payer: Self-pay

## 2020-12-10 ENCOUNTER — Emergency Department (HOSPITAL_BASED_OUTPATIENT_CLINIC_OR_DEPARTMENT_OTHER)
Admission: EM | Admit: 2020-12-10 | Discharge: 2020-12-10 | Disposition: A | Discharge status: Home / Self Care | Payer: 59 | Attending: Emergency Medicine | Admitting: Emergency Medicine

## 2020-12-10 ENCOUNTER — Emergency Department (HOSPITAL_BASED_OUTPATIENT_CLINIC_OR_DEPARTMENT_OTHER): Payer: 59

## 2020-12-10 DIAGNOSIS — Z20822 Contact with and (suspected) exposure to covid-19: Secondary | ICD-10-CM | POA: Insufficient documentation

## 2020-12-10 DIAGNOSIS — J209 Acute bronchitis, unspecified: Secondary | ICD-10-CM | POA: Diagnosis not present

## 2020-12-10 DIAGNOSIS — J40 Bronchitis, not specified as acute or chronic: Secondary | ICD-10-CM

## 2020-12-10 DIAGNOSIS — Z79899 Other long term (current) drug therapy: Secondary | ICD-10-CM | POA: Diagnosis not present

## 2020-12-10 DIAGNOSIS — R000 Tachycardia, unspecified: Secondary | ICD-10-CM | POA: Insufficient documentation

## 2020-12-10 DIAGNOSIS — I110 Hypertensive heart disease with heart failure: Secondary | ICD-10-CM | POA: Insufficient documentation

## 2020-12-10 DIAGNOSIS — R059 Cough, unspecified: Secondary | ICD-10-CM | POA: Diagnosis present

## 2020-12-10 DIAGNOSIS — Z91010 Allergy to peanuts: Secondary | ICD-10-CM | POA: Diagnosis not present

## 2020-12-10 DIAGNOSIS — I509 Heart failure, unspecified: Secondary | ICD-10-CM | POA: Diagnosis not present

## 2020-12-10 DIAGNOSIS — R051 Acute cough: Secondary | ICD-10-CM

## 2020-12-10 LAB — RESP PANEL BY RT-PCR (FLU A&B, COVID) ARPGX2
Influenza A by PCR: NEGATIVE
Influenza B by PCR: NEGATIVE
SARS Coronavirus 2 by RT PCR: NEGATIVE

## 2020-12-10 MED ORDER — IBUPROFEN 800 MG PO TABS
800.0000 mg | ORAL_TABLET | Freq: Once | ORAL | Status: AC
  Administered 2020-12-10: 800 mg via ORAL
  Filled 2020-12-10: qty 1

## 2020-12-10 MED ORDER — ACETAMINOPHEN 325 MG PO TABS
650.0000 mg | ORAL_TABLET | Freq: Once | ORAL | Status: AC
  Administered 2020-12-10: 650 mg via ORAL
  Filled 2020-12-10: qty 2

## 2020-12-10 NOTE — ED Triage Notes (Signed)
Pt c/o cough, chills, chest tightness since yesterday.

## 2020-12-10 NOTE — ED Provider Notes (Signed)
[facility] EMERGENCY DEPARTMENT Provider Note   CSN: [id number] Arrival date & time: 12/10/20  1030     History Chief Complaint  Patient presents with   Cough    Jeffery Glenn is a 50 y.o. male with past medical history significant for hypertension controlled on multiple medications who is not taking his blood pressure medication today is presenting with 5 to 6 days of cough, general malaise, chills and some chest tightness with cough.  Patient reports chest tightness is not associated with exertion, has not felt at rest when he is not coughing.  Patient has tried taking DayQuil, NyQuil without relief.  Patient is vaccinated against COVID.  Patient has not tested himself for COVID or flu.  Patient reports some yellowish phlegm with coughing.  Patient denies recent travel, recent leg swelling.  Patient does endorse some general headache mostly associated with his cough today.  HPI     Past Medical History:  Diagnosis Date   Atypical chest pain 05/17/2018   Cardiomyopathy (HCC) ejection fraction 25 to 30% in February 2020, improvement to 30 to 35% in January 2021 06/09/2018   Ejection fraction 25 to 30% on echocardiogram from February 2020   Congestive heart failure (CHF) (HCC) 05/06/2018   Dyspnea on exertion 05/17/2018   Essential hypertension 05/17/2018   Hypertension     Patient Active Problem List   Diagnosis Date Noted   Hypertension    Cardiomyopathy (HCC) ejection fraction 25 to 30% in February 2020, improvement to 30 to 35% in January 2021 06/09/2018   Atypical chest pain 05/17/2018   Essential hypertension 05/17/2018   Congestive heart failure (CHF) (HCC) 05/06/2018    Past Surgical History:  Procedure Laterality Date   HAND SURGERY     NASAL SEPTUM SURGERY     SUBQ ICD IMPLANT N/A 11/08/2020   Procedure: SUBQ ICD IMPLANT;  Surgeon: Regan Lemming, MD;  Location: [facility] INVASIVE CV LAB;  Service: Cardiovascular;  Laterality: N/A;       Family History   Problem Relation Age of Onset   Heart attack Mother    Hypertension Mother    Heart attack Father    Hypertension Father     Social History   Tobacco Use   Smoking status: Never   Smokeless tobacco: Never  Vaping Use   Vaping Use: Never used  Substance Use Topics   Alcohol use: Yes   Drug use: No    Home Medications Prior to Admission medications   Medication Sig Start Date End Date Taking? Authorizing Provider  amLODipine (NORVASC) 10 MG tablet Take 1 tablet (10 mg total) by mouth daily. 03/05/20   Georgeanna Lea, MD  atorvastatin (LIPITOR) 20 MG tablet Take 1 tablet (20 mg total) by mouth daily. 03/05/20   Georgeanna Lea, MD  Benzocaine-Menthol (CEPACOL SORE THROAT MAX NUMB) 15-4 MG LOZG Use as directed 1 lozenge in the mouth or throat every 2 (two) hours as needed (for sore throat). Patient not taking: Reported on 11/05/2020 09/08/20   Molpus, Jonny Ruiz, MD  carvedilol (COREG) 25 MG tablet Take 1 tablet (25 mg total) by mouth 2 (two) times daily. 03/05/20   Georgeanna Lea, MD  furosemide (LASIX) 40 MG tablet Take 1 tablet (40 mg total) by mouth daily. 03/05/20   Georgeanna Lea, MD  hydrALAZINE (APRESOLINE) 50 MG tablet Take 1 tablet (50 mg total) by mouth 3 (three) times daily. 06/04/20 11/05/20  Georgeanna Lea, MD  isosorbide mononitrate (IMDUR) 30 MG  24 hr tablet Take 1 tablet (30 mg total) by mouth daily. 10/15/20 01/13/21  Georgeanna Lea, MD  loratadine (CLARITIN) 10 MG tablet Take 10 mg by mouth daily as needed for allergies. 02/27/19   [provider]  sacubitril-valsartan (ENTRESTO) 97-103 MG Take 1 tablet by mouth 2 (two) times daily. 03/05/20   Georgeanna Lea, MD  spironolactone (ALDACTONE) 25 MG tablet Take 0.5 tablets (12.5 mg total) by mouth daily. 03/05/20   Georgeanna Lea, MD    Allergies    Shellfish allergy and Peanut-containing drug products  Review of Systems   Review of Systems  Respiratory:  Positive for cough and  shortness of breath.   Cardiovascular:  Negative for chest pain and palpitations.  Gastrointestinal:  Negative for abdominal pain.  All other systems reviewed and are negative.  Physical Exam Updated Vital Signs BP (!) 175/125 Comment: Hx of HTN.  Did not taken his medication today.  Pulse (!) 102   Temp 99.6 F (37.6 C) (Oral)   Resp 20   Ht 6' (1.829 m)   Wt 98.4 kg   SpO2 98%   BMI 29.43 kg/m   Physical Exam Vitals and nursing note reviewed.  Constitutional:      General: He is not in acute distress.    Appearance: Normal appearance.  HENT:     Head: Normocephalic and atraumatic.  Eyes:     General:        Right eye: No discharge.        Left eye: No discharge.  Cardiovascular:     Rate and Rhythm: Normal rate and regular rhythm.     Heart sounds: No murmur heard.   No friction rub. No gallop.     Comments: Patient in and out of mild tachycardia during his visit.  During my examination patient was nontachycardic. Pulmonary:     Effort: Pulmonary effort is normal.     Breath sounds: Normal breath sounds.  Abdominal:     General: Bowel sounds are normal.     Palpations: Abdomen is soft.  Musculoskeletal:        General: No swelling.     Comments: No tenderness to palpation of bilateral calves.  Negative Homans' sign bilaterally.  Skin:    General: Skin is warm and dry.     Capillary Refill: Capillary refill takes less than 2 seconds.  Neurological:     Mental Status: He is alert and oriented to person, place, and time.  Psychiatric:        Mood and Affect: Mood normal.        Behavior: Behavior normal.    ED Results / Procedures / Treatments   Labs (all labs ordered are listed, but only abnormal results are displayed) Labs Reviewed  RESP PANEL BY RT-PCR (FLU A&B, COVID) [id number]    EKG None  Radiology DG Chest Portable 1 View  Result Date: 12/10/2020 CLINICAL DATA:  Chest tightness, shortness of breath EXAM: PORTABLE CHEST 1 VIEW COMPARISON:  Chest  radiograph 11/30/2020 FINDINGS: A left chest wall cardiac device with a single anterior lead projecting over the midline is unchanged. The heart is enlarged, unchanged. The mediastinal contours are within normal limits. There is no focal consolidation or pulmonary edema. There is no pleural effusion or pneumothorax. The bones are normal. IMPRESSION: Unchanged cardiomegaly with no radiographic evidence of acute cardiopulmonary process. Electronically Signed   By: Lesia Hausen M.D.   On: 12/10/2020 11:43    Procedures Procedures  Medications Ordered in ED Medications  ibuprofen (ADVIL) tablet 800 mg (800 mg Oral Given 12/10/20 1644)  acetaminophen (TYLENOL) tablet 650 mg (650 mg Oral Given 12/10/20 1644)    ED Course  I have reviewed the triage vital signs and the nursing notes.  Pertinent labs & imaging results that were available during my care of the patient were reviewed by me and considered in my medical decision making (see chart for details).    MDM Rules/Calculators/A&P                         Patient has had cough productive of yellow sputum without shortness of breath or chest pain.  Patient in triage complains that this is only been going on for a few days, however during our interview he reports that this has been happening for 5 to 6 days.   COVID and flu swabs are negative.  Patient is afebrile today.  Chest x-ray without abnormality other than cardiomegaly noted on previous chest x-rays.  Patient is notably quite hypertensive however is on metformin consists of multiple medications for high blood pressure.  Patient does report that he is not taking anything for blood pressure today.  Patient denies chest pain other than a little bit of pain with cough, only associated with cough.  No chest pain with exertion or stress.  This patient reports symptoms been present for 5 to 6 days when I spoke with him, his presentation is consistent with bronchitis or other viral upper respiratory  infection at this time.  There is no evidence of bacterial infection requiring antibiotics at this time.  Patient does have some transient tachycardia during his visit, nontachycardic during my evaluation.  Patient has no recent travel, no evidence of hemoptysis.  No signs or symptoms of a DVT at this time.  Minimal clinical concern for pulmonary embolus at this time.  Recommend patient takes blood pressure medication, take antitussives such as Robitussin for his cough, take ibuprofen and Tylenol as needed for pain and follow-up if symptoms worsen or fail to improve.  At this time patient is discharged in stable condition. Extensive return precautions were given. Final Clinical Impression(s) / ED Diagnoses Final diagnoses:  Acute cough  Bronchitis    Rx / DC Orders ED Discharge Orders     None        [place] 12/10/20 2029    Rolan Bucco, MD 12/10/20 2032

## 2020-12-10 NOTE — Discharge Instructions (Signed)
As you have had a cough, with some phlegm for several days, however there are no signs of any pneumonia on your chest x-ray it appears that you may have some bronchitis at this time.  Please use Tylenol or ibuprofen for pain.  You may use 600 mg ibuprofen every 6 hours or 1000 mg of Tylenol every 6 hours.  You may choose to alternate between the 2.  This would be most effective.  Not to exceed 4 g of Tylenol within 24 hours.  Not to exceed 3200 mg ibuprofen 24 hours.  I recommend Robitussin for the coughing symptoms.  Please follow-up if your symptoms worsen or fail to improve.  You can check the results of your COVID and flu swab on your portal later tonight.

## 2021-01-14 ENCOUNTER — Other Ambulatory Visit: Payer: Self-pay

## 2021-01-16 ENCOUNTER — Ambulatory Visit (INDEPENDENT_AMBULATORY_CARE_PROVIDER_SITE_OTHER): Payer: 59 | Admitting: Cardiology

## 2021-01-16 ENCOUNTER — Encounter: Payer: Self-pay | Admitting: Cardiology

## 2021-01-16 ENCOUNTER — Other Ambulatory Visit: Payer: Self-pay

## 2021-01-16 VITALS — BP 180/108 | HR 82 | Ht 72.0 in | Wt 235.0 lb

## 2021-01-16 DIAGNOSIS — R0789 Other chest pain: Secondary | ICD-10-CM | POA: Diagnosis not present

## 2021-01-16 DIAGNOSIS — I5042 Chronic combined systolic (congestive) and diastolic (congestive) heart failure: Secondary | ICD-10-CM | POA: Diagnosis not present

## 2021-01-16 DIAGNOSIS — Z95810 Presence of automatic (implantable) cardiac defibrillator: Secondary | ICD-10-CM | POA: Insufficient documentation

## 2021-01-16 DIAGNOSIS — I420 Dilated cardiomyopathy: Secondary | ICD-10-CM | POA: Diagnosis not present

## 2021-01-16 DIAGNOSIS — I10 Essential (primary) hypertension: Secondary | ICD-10-CM

## 2021-01-16 MED ORDER — SPIRONOLACTONE 25 MG PO TABS
25.0000 mg | ORAL_TABLET | Freq: Every day | ORAL | 1 refills | Status: DC
Start: 2021-01-16 — End: 2021-08-28

## 2021-01-16 NOTE — Progress Notes (Signed)
Cardiology Office Note:    Date:  01/16/2021   ID:  Jeffery Glenn, DOB 14-Jun-1970, MRN [id number]  PCP:  [facility]  Cardiologist:  Gypsy Balsam, MD    Referring MD: [facility]   Chief Complaint  Patient presents with   Follow-up  I am doing fine  History of Present Illness:    Jeffery Glenn is a 50 y.o. 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  He comes today to my office for follow-up overall he is doing well.  Denies having chest pain tightness squeezing pressure burning chest.  He does have some numbness in the area of pacemaker/defibrillator implant but otherwise doing well.  Past Medical History:  Diagnosis Date   Atypical chest pain 05/17/2018   Cardiomyopathy (HCC) ejection fraction 25 to 30% in February 2020, improvement to 30 to 35% in January 2021 06/09/2018   Ejection fraction 25 to 30% on echocardiogram from February 2020   Congestive heart failure (CHF) (HCC) 05/06/2018   Dyspnea on exertion 05/17/2018   Essential hypertension 05/17/2018   Hypertension     Past Surgical History:  Procedure Laterality Date   HAND SURGERY     NASAL SEPTUM SURGERY     SUBQ ICD IMPLANT N/A 11/08/2020   Procedure: SUBQ ICD IMPLANT;  Surgeon: Regan Lemming, MD;  Location: [facility] INVASIVE CV LAB;  Service: Cardiovascular;  Laterality: N/A;    Current Medications: Current Meds  Medication Sig   amLODipine (NORVASC) 10 MG tablet Take 1 tablet (10 mg total) by mouth daily.   atorvastatin (LIPITOR) 20 MG tablet Take 1 tablet (20 mg total) by mouth daily.   carvedilol (COREG) 25 MG tablet Take 1 tablet (25 mg total) by mouth 2 (two) times daily.   furosemide (LASIX) 40 MG tablet Take 1 tablet (40 mg total) by mouth daily.    hydrALAZINE (APRESOLINE) 50 MG tablet Take 1 tablet (50 mg total) by mouth 3 (three) times daily.   isosorbide mononitrate (IMDUR) 30 MG 24 hr tablet Take 1 tablet (30 mg total) by mouth daily.   sacubitril-valsartan (ENTRESTO) 97-103 MG Take 1 tablet by mouth 2 (two) times daily.   spironolactone (ALDACTONE) 25 MG tablet Take 0.5 tablets (12.5 mg total) by mouth daily.     Allergies:   Shellfish allergy and Peanut-containing drug products   Social History   Socioeconomic History   Marital status: Married    Spouse name: Not on file   Number of children: Not on file   Years of education: Not on file   Highest education level: Not on file  Occupational History   Not on file  Tobacco Use   Smoking status: Never   Smokeless tobacco: Never  Vaping Use   Vaping Use: Never used  Substance and Sexual Activity   Alcohol use: Yes   Drug use: No   Sexual activity: Not on file  Other Topics Concern   Not on file  Social History Narrative   Not on file   Social Determinants of Health   Financial Resource Strain: Not on file  Food Insecurity: Not on file  Transportation Needs: Not on file  Physical Activity: Not on file  Stress: Not on file  Social Connections: Not on file     Family History:  The patient's family history includes Heart attack in his father and mother; Hypertension in his father and mother. ROS:   Please see the history of present illness.    All 14 point review of systems negative except as described per history of present illness  EKGs/Labs/Other Studies Reviewed:      Recent Labs: 04/23/2020: B Natriuretic Peptide 495.6; Magnesium 2.0 11/30/2020: ALT 13; BUN 15; Creatinine, Ser 0.88; Hemoglobin 13.4; Platelets 202; Potassium 3.7; Sodium 139  Recent Lipid Panel    Component Value Date/Time   CHOL 184 01/31/2008 2114   TRIG 62 01/31/2008 2114   HDL 52 01/31/2008 2114   CHOLHDL 3.5 Ratio 01/31/2008 2114   VLDL 12 01/31/2008 2114   LDLCALC 120 (H)  01/31/2008 2114    Physical Exam:    VS:  BP (!) 180/108 (BP Location: Left Arm, Patient Position: Sitting)   Pulse 82   Ht 6' (1.829 m)   Wt 235 lb (106.6 kg)   SpO2 97%   BMI 31.87 kg/m     Wt Readings from Last 3 Encounters:  01/16/21 235 lb (106.6 kg)  12/10/20 217 lb (98.4 kg)  11/30/20 245 lb (111.1 kg)     GEN:  Well nourished, well developed in no acute distress HEENT: Normal NECK: No JVD; No carotid bruits LYMPHATICS: No lymphadenopathy CARDIAC: RRR, no murmurs, no rubs, no gallops RESPIRATORY:  Clear to auscultation without rales, wheezing or rhonchi  ABDOMEN: Soft, non-tender, non-distended MUSCULOSKELETAL:  No edema; No deformity  SKIN: Warm and dry LOWER EXTREMITIES: no swelling NEUROLOGIC:  Alert and oriented x 3 PSYCHIATRIC:  Normal affect   ASSESSMENT:    1. Dilated cardiomyopathy (HCC)   2. Atypical chest pain   3. Essential hypertension   4. Chronic combined systolic and diastolic congestive heart failure (HCC)   5. ICD (implantable cardioverter-defibrillator) in place    PLAN:    In order of problems listed above:  Cardiomyopathy on appropriate guideline directed medical therapy.  His blood pressure still not well controlled.  I will double the dose of Aldactone and then a week later we will check his Chem-7.  In the future we consider adding SGLT blocker. Essential hypertension uncontrolled.  We will recheck it before he leaves the room however I will increase Aldactone hopefully this will improve the situation as well. Congestive heart failure.  She is New York Heart Association class I/II.  Doing well. New ICD implant wound healed completely.  Look like he is doing well.  No discharges from the defibrillator.   Medication Adjustments/Labs and Tests Ordered: Current medicines are reviewed at length with the patient today.  Concerns regarding medicines are outlined above.  No orders of the defined types were placed in this  encounter.  Medication changes: No orders of the defined types were placed in this encounter.   Signed, Georgeanna Lea, MD, [facility] 01/16/2021 1:26 PM    [facility]

## 2021-01-16 NOTE — Patient Instructions (Signed)
Medication Instructions:  Your physician has recommended you make the following change in your medication:  INCREASE: Aldactone to 25 mg daily  *If you need a refill on your cardiac medications before your next appointment, please call your pharmacy*   Lab Work: Your physician recommends that you return for lab work in 1 week: bmp  If you have labs (blood work) drawn today and your tests are completely normal, you will receive your results only by: MyChart Message (if you have MyChart) OR A paper copy in the mail If you have any lab test that is abnormal or we need to change your treatment, we will call you to review the results.   Testing/Procedures: None.   Follow-Up: At [facility], you and your health needs are our priority.  As part of our continuing mission to provide you with exceptional heart care, we have created designated Provider Care Teams.  These Care Teams include your primary Cardiologist (physician) and Advanced Practice Providers (APPs -  Physician Assistants and Nurse Practitioners) who all work together to provide you with the care you need, when you need it.  We recommend signing up for the patient portal called "MyChart".  Sign up information is provided on this After Visit Summary.  MyChart is used to connect with patients for Virtual Visits (Telemedicine).  Patients are able to view lab/test results, encounter notes, upcoming appointments, etc.  Non-urgent messages can be sent to your provider as well.   To learn more about what you can do with MyChart, go to [email or web address].    Your next appointment:   2 month(s)  The format for your next appointment:   In Person  Provider:   Gypsy Balsam, MD    Other Instructions

## 2021-01-16 NOTE — Addendum Note (Signed)
Addended by: Hazle Quant on: 01/16/2021 01:38 PM   Modules accepted: Orders

## 2021-01-25 ENCOUNTER — Other Ambulatory Visit: Payer: Self-pay | Admitting: Cardiology

## 2021-02-06 ENCOUNTER — Telehealth: Payer: Self-pay

## 2021-02-06 NOTE — Telephone Encounter (Signed)
Prior Auth for Sherryll Burger 97/103 approved for one year from 02/06/2021. Both patient and pharmacy notified via VM. Confirmation number to approval is [id number] per Adi pa rep.

## 2021-02-12 ENCOUNTER — Ambulatory Visit (INDEPENDENT_AMBULATORY_CARE_PROVIDER_SITE_OTHER): Payer: 59

## 2021-02-12 DIAGNOSIS — I428 Other cardiomyopathies: Secondary | ICD-10-CM

## 2021-02-12 LAB — CUP PACEART REMOTE DEVICE CHECK
Battery Remaining Percentage: 98 %
Date Time Interrogation Session: 20221207104000
Implantable Lead Implant Date: 20220902
Implantable Lead Location: 753860
Implantable Lead Model: 3501
Implantable Lead Serial Number: 225042
Implantable Pulse Generator Implant Date: 20220902
Pulse Gen Serial Number: 166628

## 2021-02-21 NOTE — Progress Notes (Signed)
Remote ICD transmission.   

## 2021-03-31 ENCOUNTER — Other Ambulatory Visit: Payer: Self-pay

## 2021-03-31 ENCOUNTER — Ambulatory Visit (INDEPENDENT_AMBULATORY_CARE_PROVIDER_SITE_OTHER): Payer: 59 | Admitting: Cardiology

## 2021-03-31 ENCOUNTER — Encounter: Payer: Self-pay | Admitting: Cardiology

## 2021-03-31 VITALS — BP 164/108 | HR 85 | Ht 72.0 in | Wt 238.0 lb

## 2021-03-31 DIAGNOSIS — I428 Other cardiomyopathies: Secondary | ICD-10-CM

## 2021-03-31 DIAGNOSIS — I10 Essential (primary) hypertension: Secondary | ICD-10-CM

## 2021-03-31 MED ORDER — HYDRALAZINE HCL 100 MG PO TABS: 50.0000 mg | ORAL_TABLET | Freq: Three times a day (TID) | ORAL | 3 refills | Status: DC

## 2021-03-31 NOTE — Patient Instructions (Addendum)
Medication Instructions:  Your physician has recommended you make the following change in your medication:  INCREASE Hydralazine to 100 mg three times a day  *If you need a refill on your cardiac medications before your next appointment, please call your pharmacy*   Lab Work: None ordered   Testing/Procedures: None ordered   Follow-Up: At [facility], you and your health needs are our priority.  As part of our continuing mission to provide you with exceptional heart care, we have created designated Provider Care Teams.  These Care Teams include your primary Cardiologist (physician) and Advanced Practice Providers (APPs -  Physician Assistants and Nurse Practitioners) who all work together to provide you with the care you need, when you need it.  Your next appointment:   9 month(s)  The format for your next appointment:   In Person  Provider:   Loman Brooklyn, MD    Thank you for choosing [facility]!!   Dory Horn, RN [phone]   Other Instructions

## 2021-03-31 NOTE — Progress Notes (Signed)
Electrophysiology Office Note   Date:  03/31/2021   ID:  Jeffery Glenn, DOB 1970/10/13, MRN [id number]  PCP:  [facility]  Cardiologist:  Agustin Cree Primary Electrophysiologist:  Matia Zelada Meredith Leeds, MD    Chief Complaint: CHF   History of Present Illness: Jeffery Glenn is a 51 y.o. male who is being seen today for the evaluation of CHF at the request of [facility] Presenting today for electrophysiology evaluation.  He has a history significant for nonischemic cardiomyopathy and hypertension.  He had a cardiac CT showed no evidence of coronary artery disease.  He is status post [place] implanted 11/12/2020.  Today, denies symptoms of palpitations, chest pain, shortness of breath, orthopnea, PND, lower extremity edema, claudication, dizziness, presyncope, syncope, bleeding, or neurologic sequela. The patient is tolerating medications without difficulties.  Since his device was implanted he has done well.  No chest pain or shortness of breath.  They do all of his daily activities without restriction.  He overall feels well and has no complaints today.   Past Medical History:  Diagnosis Date   Atypical chest pain 05/17/2018   Cardiomyopathy ([place]) ejection fraction 25 to 30% in February 2020, improvement to 30 to 35% in January 2021 06/09/2018   Ejection fraction 25 to 30% on echocardiogram from February 2020   Congestive heart failure (CHF) ([place]) 05/06/2018   Dyspnea on exertion 05/17/2018   Essential hypertension 05/17/2018   Hypertension    Past Surgical History:  Procedure Laterality Date   HAND SURGERY     NASAL SEPTUM SURGERY     SUBQ ICD IMPLANT N/A 11/08/2020   Procedure: SUBQ ICD IMPLANT;  Surgeon: Constance Haw, MD;  Location: [place] CV LAB;  Service: Cardiovascular;  Laterality: N/A;     Current Outpatient Medications  Medication Sig Dispense Refill   hydrALAZINE (APRESOLINE) 100 MG tablet Take 0.5 tablets (50  mg total) by mouth 3 (three) times daily. 270 tablet 3   amLODipine (NORVASC) 10 MG tablet Take 1 tablet (10 mg total) by mouth daily. 90 tablet 3   atorvastatin (LIPITOR) 20 MG tablet Take 1 tablet (20 mg total) by mouth daily. 90 tablet 3   carvedilol (COREG) 25 MG tablet Take 1 tablet (25 mg total) by mouth 2 (two) times daily. 180 tablet 3   ENTRESTO 97-103 MG TAKE 1 TABLET BY MOUTH TWICE DAILY 180 tablet 3   furosemide (LASIX) 40 MG tablet Take 1 tablet (40 mg total) by mouth daily. 90 tablet 3   isosorbide mononitrate (IMDUR) 30 MG 24 hr tablet Take 1 tablet (30 mg total) by mouth daily. 90 tablet 3   spironolactone (ALDACTONE) 25 MG tablet Take 1 tablet (25 mg total) by mouth daily. 90 tablet 1   No current facility-administered medications for this visit.    Allergies:   Shellfish allergy and Peanut-containing drug products   Social History:  The patient  reports that he has never smoked. He has never used smokeless tobacco. He reports current alcohol use. He reports that he does not use drugs.   Family History:  The patient's family history includes Heart attack in his father and mother; Hypertension in his father and mother.   ROS:  Please see the history of present illness.   Otherwise, review of systems is positive for none.   All other systems are reviewed and negative.   PHYSICAL EXAM: VS:  BP (!) 164/108    Pulse 85  Ht 6' (1.829 m)    Wt 238 lb (108 kg)    SpO2 96%    BMI 32.28 kg/m  , BMI Body mass index is 32.28 kg/m. GEN: Well nourished, well developed, in no acute distress  HEENT: normal  Neck: no JVD, carotid bruits, or masses Cardiac: RRR; no murmurs, rubs, or gallops,no edema  Respiratory:  clear to auscultation bilaterally, normal work of breathing GI: soft, nontender, nondistended, + BS MS: no deformity or atrophy  Skin: warm and dry, device site well healed Neuro:  Strength and sensation are intact Psych: euthymic mood, full affect  EKG:  EKG is ordered  today. Personal review of the ekg ordered shows sinus rhythm, LVH, rate 85  Personal review of the device interrogation today. Results in [place]: 04/23/2020: B Natriuretic Peptide 495.6; Magnesium 2.0 11/30/2020: ALT 13; BUN 15; Creatinine, Ser 0.88; Hemoglobin 13.4; Platelets 202; Potassium 3.7; Sodium 139    Lipid Panel     Component Value Date/Time   CHOL 184 01/31/2008 2114   TRIG 62 01/31/2008 2114   HDL 52 01/31/2008 2114   CHOLHDL 3.5 Ratio 01/31/2008 2114   VLDL 12 01/31/2008 2114   LDLCALC 120 (H) 01/31/2008 2114     Wt Readings from Last 3 Encounters:  03/31/21 238 lb (108 kg)  01/16/21 235 lb (106.6 kg)  12/10/20 217 lb (98.4 kg)      Other studies Reviewed: Additional studies/ records that were reviewed today include: TTE 09/18/2020 Review of the above records today demonstrates:   1. Left ventricular ejection fraction, by estimation, is 20 to 25%. The  left ventricle has severely decreased function. The left ventricle  demonstrates global hypokinesis. The left ventricular internal cavity size  was severely dilated. There is moderate   concentric left ventricular hypertrophy. Left ventricular diastolic  parameters are consistent with Grade I diastolic dysfunction (impaired  relaxation).   2. Right ventricular systolic function is mildly reduced. The right  ventricular size is mildly enlarged. There is normal pulmonary artery  systolic pressure.   3. Left atrial size was severely dilated.   4. The mitral valve is normal in structure. Mild mitral valve  regurgitation. No evidence of mitral stenosis.   5. The aortic valve is tricuspid. Aortic valve regurgitation is not  visualized. No aortic stenosis is present.   6. The inferior vena cava is normal in size with greater than 50%  respiratory variability, suggesting right atrial pressure of 3 mmHg.   Coronary CTA 06/15/19 1. Coronary calcium score of 0. This was 0 percentile for age and sex  matched control. 2. Normal coronary origin with right dominance.  Ramus branch noted. 3. No evidence of CAD. 4. There is left ventricular hypertrophy (20 mm septum and 16 mm lateral wall). 5. Non ischemic cardiomyopathy, probably hypertensive in etiology.  ASSESSMENT AND PLAN:  1.  Chronic systolic heart failure: Currently on carvedilol 25 mg twice daily, hydralazine 50 mg 3 times daily, Imdur 30 mg daily, Entresto 97/103 mg twice daily, Aldactone 25 mg daily.  Is status post [place].  Device functioning appropriately.  No changes at this time.  2.  Hypertension with hypertensive heart disease:  1.  Chronic systolic heart failure due to nonischemic cardiomyopathy: Currently on optimal medical therapy with carvedilol, Entresto, Aldactone.  Unfortunately his ejection fraction remains low.  He would benefit from ICD implant.  We discussed both traditional and S ICD.  This point, he would prefer a past ICD.  Risks and benefits of discussed.  Risk include bleeding and infection.  He understands these risks and has agreed to the procedure.  2.  Hypertension: Significantly elevated today.  Thang Flett increase hydralazine to 100 mg  Current medicines are reviewed at length with the patient today.   The patient does not have concerns regarding his medicines.  The following changes were made today: Increase hydralazine  Labs/ tests ordered today include:  Orders Placed This Encounter  Procedures   EKG 12-Lead      Disposition:   FU with Ayza Ripoll 9 months  Signed, Tekoa Hamor Meredith Leeds, MD  03/31/2021 4:19 PM     [address] [place] [address] [phone] (office) [phone] (fax)

## 2021-04-09 ENCOUNTER — Ambulatory Visit: Payer: 59 | Admitting: Cardiology

## 2021-05-03 ENCOUNTER — Encounter (HOSPITAL_BASED_OUTPATIENT_CLINIC_OR_DEPARTMENT_OTHER): Payer: Self-pay | Admitting: *Deleted

## 2021-05-03 ENCOUNTER — Emergency Department (HOSPITAL_BASED_OUTPATIENT_CLINIC_OR_DEPARTMENT_OTHER)
Admission: EM | Admit: 2021-05-03 | Discharge: 2021-05-03 | Disposition: A | Discharge status: Home / Self Care | Payer: 59 | Attending: Emergency Medicine | Admitting: Emergency Medicine

## 2021-05-03 ENCOUNTER — Other Ambulatory Visit: Payer: Self-pay

## 2021-05-03 DIAGNOSIS — R519 Headache, unspecified: Secondary | ICD-10-CM

## 2021-05-03 DIAGNOSIS — Z91010 Allergy to peanuts: Secondary | ICD-10-CM | POA: Diagnosis not present

## 2021-05-03 DIAGNOSIS — Z79899 Other long term (current) drug therapy: Secondary | ICD-10-CM | POA: Insufficient documentation

## 2021-05-03 MED ORDER — ACETAMINOPHEN 325 MG PO TABS
975.0000 mg | ORAL_TABLET | Freq: Once | ORAL | Status: AC
  Administered 2021-05-03: 975 mg via ORAL
  Filled 2021-05-03: qty 3

## 2021-05-03 MED ORDER — KETOROLAC TROMETHAMINE 30 MG/ML IJ SOLN
30.0000 mg | Freq: Once | INTRAMUSCULAR | Status: AC
  Administered 2021-05-03: 30 mg via INTRAMUSCULAR
  Filled 2021-05-03: qty 1

## 2021-05-03 MED ORDER — NAPROXEN 375 MG PO TABS: 375.0000 mg | ORAL_TABLET | Freq: Two times a day (BID) | ORAL | 0 refills | Status: DC

## 2021-05-03 NOTE — Discharge Instructions (Signed)
Take the medications as needed for headache.  You can also supplement with tylenol.  Rest, drink plenty of fluids.

## 2021-05-03 NOTE — ED Triage Notes (Signed)
Pt reports headache since last night. He has taken tylenol sinus without relief. States he had chest pain earlier and took gas-x and tums with relief. States work sent him home to get checked out

## 2021-05-03 NOTE — ED Provider Notes (Signed)
[facility] EMERGENCY DEPARTMENT Provider Note   CSN: [id number] Arrival date & time: 05/03/21  1501     History  Chief Complaint  Patient presents with   Headache    Jeffery Glenn is a 51 y.o. male.   Headache Associated symptoms: no fever and no numbness    Patient presented to the ED for evaluation of a headache.  Patient states he has history of headaches in the past but it usually been associated with his high blood pressure or sinuses.  He started having a headache last night and tried taking some Tylenol sinus with relief.  Has not had any coughing.  He has not had any congestion.  He denies any neck pain.  No sudden onset.  No numbness or weakness.  No vomiting.  Patient states he also had some chest discomfort earlier but took Gas-X and Tums and his symptoms have resolved.  He was at work today and they suggested he come to the ED to be evaluated.  Home Medications Prior to Admission medications   Medication Sig Start Date End Date Taking? Authorizing Provider  naproxen (NAPROSYN) 375 MG tablet Take 1 tablet (375 mg total) by mouth 2 (two) times daily. 05/03/21  Yes Linwood Dibbles, MD  amLODipine (NORVASC) 10 MG tablet Take 1 tablet (10 mg total) by mouth daily. 03/05/20   Georgeanna Lea, MD  atorvastatin (LIPITOR) 20 MG tablet Take 1 tablet (20 mg total) by mouth daily. 03/05/20   Georgeanna Lea, MD  carvedilol (COREG) 25 MG tablet Take 1 tablet (25 mg total) by mouth 2 (two) times daily. 03/05/20   Georgeanna Lea, MD  ENTRESTO 97-103 MG TAKE 1 TABLET BY MOUTH TWICE DAILY 01/27/21   Georgeanna Lea, MD  furosemide (LASIX) 40 MG tablet Take 1 tablet (40 mg total) by mouth daily. 03/05/20   Georgeanna Lea, MD  hydrALAZINE (APRESOLINE) 100 MG tablet Take 0.5 tablets (50 mg total) by mouth 3 (three) times daily. 03/31/21   Camnitz, Andree Coss, MD  isosorbide mononitrate (IMDUR) 30 MG 24 hr tablet Take 1 tablet (30 mg total) by mouth daily. 10/15/20  01/16/21  Georgeanna Lea, MD  spironolactone (ALDACTONE) 25 MG tablet Take 1 tablet (25 mg total) by mouth daily. 01/16/21   Georgeanna Lea, MD      Allergies    Shellfish allergy and Peanut-containing drug products    Review of Systems   Review of Systems  Constitutional:  Negative for fever.  Respiratory:  Negative for shortness of breath.   Cardiovascular:  Negative for chest pain.  Neurological:  Positive for headaches. Negative for speech difficulty and numbness.   Physical Exam Updated Vital Signs BP 134/85 (BP Location: Right Arm)    Pulse 71    Temp 97.9 F (36.6 C) (Oral)    Resp 18    Ht 1.829 m (6')    Wt 105.2 kg    SpO2 95%    BMI 31.46 kg/m  Physical Exam Vitals and nursing note reviewed.  Constitutional:      General: He is not in acute distress.    Appearance: He is well-developed.  HENT:     Head: Normocephalic and atraumatic.     Right Ear: External ear normal.     Left Ear: External ear normal.  Eyes:     General: No scleral icterus.       Right eye: No discharge.        Left eye: No  discharge.     Conjunctiva/sclera: Conjunctivae normal.  Neck:     Trachea: No tracheal deviation.  Cardiovascular:     Rate and Rhythm: Normal rate and regular rhythm.  Pulmonary:     Effort: Pulmonary effort is normal. No respiratory distress.     Breath sounds: Normal breath sounds. No stridor. No wheezing or rales.  Abdominal:     General: Bowel sounds are normal. There is no distension.     Palpations: Abdomen is soft.     Tenderness: There is no abdominal tenderness. There is no guarding or rebound.  Musculoskeletal:        General: No tenderness or deformity.     Cervical back: Normal range of motion and neck supple.  Lymphadenopathy:     Cervical: No cervical adenopathy.  Skin:    General: Skin is warm and dry.     Findings: No rash.  Neurological:     General: No focal deficit present.     Mental Status: He is alert.     Cranial Nerves: No  cranial nerve deficit (no facial droop, extraocular movements intact, no slurred speech).     Sensory: No sensory deficit.     Motor: No abnormal muscle tone or seizure activity.     Coordination: Coordination normal.  Psychiatric:        Mood and Affect: Mood normal.    ED Results / Procedures / Treatments   Labs (all labs ordered are listed, but only abnormal results are displayed) Labs Reviewed - No data to display  EKG EKG Interpretation  Date/Time:  Saturday May 03 2021 15:33:49 EST Ventricular Rate:  67 PR Interval:  188 QRS Duration: 108 QT Interval:  447 QTC Calculation: 472 R Axis:   -80 Text Interpretation: Sinus rhythm Probable left atrial enlargement Left anterior fascicular block Abnormal R-wave progression, late transition Left ventricular hypertrophy Nonspecific T abnormalities, lateral leads Anterior ST elevation, probably due to LVH No significant change since last tracing Confirmed by Linwood Dibbles [phone]) on 05/03/2021 3:37:28 PM  Radiology No results found.  Procedures Procedures    Medications Ordered in ED Medications  acetaminophen (TYLENOL) tablet 975 mg (975 mg Oral Given 05/03/21 1527)  ketorolac (TORADOL) 30 MG/ML injection 30 mg (30 mg Intramuscular Given 05/03/21 1541)    ED Course/ Medical Decision Making/ A&P Clinical Course as of 05/03/21 1542  Sat May 03, 2021  1523 Discussed treatment in the ED.  Patient drove himself here.  He would like to be able to drive himself home and avoid any medications that could make him drowsy [JK]    Clinical Course User Index [JK] Linwood Dibbles, MD                           Medical Decision Making Risk OTC drugs. Prescription drug management.   Patient with complaints of headache.  No fevers or chills.  No meningismus.  Doubt meningitis or infectious etiology.  No thunderclap onset to suggest subarachnoid arachnoid hemorrhage.  Patient appears calm and comfortable.  Doubt serious etiology.  Will  discharge home with recommendations for over-the-counter medications.  Rest drink plenty of fluids.        Final Clinical Impression(s) / ED Diagnoses Final diagnoses:  Acute nonintractable headache, unspecified headache type    Rx / DC Orders ED Discharge Orders          Ordered    naproxen (NAPROSYN) 375 MG tablet  2 times daily  05/03/21 1541              Linwood Dibbles, MD 05/03/21 [phone]

## 2021-05-06 ENCOUNTER — Other Ambulatory Visit: Payer: Self-pay | Admitting: Cardiology

## 2021-05-14 ENCOUNTER — Ambulatory Visit (INDEPENDENT_AMBULATORY_CARE_PROVIDER_SITE_OTHER): Payer: 59

## 2021-05-14 DIAGNOSIS — I428 Other cardiomyopathies: Secondary | ICD-10-CM

## 2021-05-15 LAB — CUP PACEART REMOTE DEVICE CHECK
Battery Remaining Percentage: 95 %
Date Time Interrogation Session: 20230309094900
Implantable Lead Implant Date: 20220902
Implantable Lead Location: 753860
Implantable Lead Model: 3501
Implantable Lead Serial Number: 225042
Implantable Pulse Generator Implant Date: 20220902
Pulse Gen Serial Number: 166628

## 2021-05-16 ENCOUNTER — Ambulatory Visit (INDEPENDENT_AMBULATORY_CARE_PROVIDER_SITE_OTHER): Payer: 59 | Admitting: Cardiology

## 2021-05-16 ENCOUNTER — Other Ambulatory Visit: Payer: Self-pay

## 2021-05-16 ENCOUNTER — Encounter: Payer: Self-pay | Admitting: Cardiology

## 2021-05-16 VITALS — BP 138/90 | HR 77 | Ht 72.0 in | Wt 236.0 lb

## 2021-05-16 DIAGNOSIS — I10 Essential (primary) hypertension: Secondary | ICD-10-CM | POA: Diagnosis not present

## 2021-05-16 DIAGNOSIS — I420 Dilated cardiomyopathy: Secondary | ICD-10-CM

## 2021-05-16 DIAGNOSIS — Z95810 Presence of automatic (implantable) cardiac defibrillator: Secondary | ICD-10-CM | POA: Diagnosis not present

## 2021-05-16 DIAGNOSIS — I5042 Chronic combined systolic (congestive) and diastolic (congestive) heart failure: Secondary | ICD-10-CM | POA: Diagnosis not present

## 2021-05-16 MED ORDER — ISOSORBIDE MONONITRATE ER 60 MG PO TB24: 60.0000 mg | ORAL_TABLET | Freq: Every day | ORAL | 3 refills | Status: DC

## 2021-05-16 NOTE — Progress Notes (Signed)
?Cardiology Office Note:   ? ?Date:  05/16/2021  ? ?ID:  Jeffery Glenn, DOB 1970/03/20, MRN [id number] ? ?PCP:  [facility]  ?Cardiologist:  Gypsy Balsam, MD   ? ?Referring MD: [facility]  ? ?Chief Complaint  ?Patient presents with  ? Follow-up  ?I am doing fine ? ?History of Present Illness:   ? ?Jeffery Glenn is a 51 y.o. male  with past medical history significant for nonischemic cardiomyopathy with ejection fraction 25 to 30%, congestive heart failure, hypertension which appears to be difficult to control, dyspnea on exertion, recently he did have ICD implant which is subcute device. ?He comes today to my office for follow-up.  Overall he seems to be doing well.  He denies have any chest pain tightness squeezing pressure mid chest.  He works as a Chartered certified accountant and he is always very busy denies having discharge from defibrillator. ? ?Past Medical History:  ?Diagnosis Date  ? Atypical chest pain 05/17/2018  ? Cardiomyopathy (HCC) ejection fraction 25 to 30% in February 2020, improvement to 30 to 35% in January 2021 06/09/2018  ? Ejection fraction 25 to 30% on echocardiogram from February 2020  ? Congestive heart failure (CHF) (HCC) 05/06/2018  ? Dyspnea on exertion 05/17/2018  ? Essential hypertension 05/17/2018  ? Hypertension   ? ? ?Past Surgical History:  ?Procedure Laterality Date  ? HAND SURGERY    ? NASAL SEPTUM SURGERY    ? SUBQ ICD IMPLANT N/A 11/08/2020  ? Procedure: SUBQ ICD IMPLANT;  Surgeon: Regan Lemming, MD;  Location: [facility] INVASIVE CV LAB;  Service: Cardiovascular;  Laterality: N/A;  ? ? ?Current Medications: ?Current Meds  ?Medication Sig  ? amLODipine (NORVASC) 10 MG tablet Take 1 tablet (10 mg total) by mouth daily.  ? atorvastatin (LIPITOR) 20 MG tablet Take 1 tablet (20 mg total) by mouth daily.  ? carvedilol (COREG) 25 MG tablet Take 1 tablet (25 mg total) by mouth 2 (two) times daily with a meal.  ? furosemide (LASIX) 40 MG tablet Take 1 tablet (40 mg total)  by mouth daily.  ? hydrALAZINE (APRESOLINE) 100 MG tablet Take 0.5 tablets (50 mg total) by mouth 3 (three) times daily.  ? isosorbide mononitrate (IMDUR) 30 MG 24 hr tablet Take 1 tablet (30 mg total) by mouth daily.  ? naproxen (NAPROSYN) 375 MG tablet Take 1 tablet (375 mg total) by mouth 2 (two) times daily.  ? sacubitril-valsartan (ENTRESTO) 97-103 MG Take 1 tablet by mouth 2 (two) times daily.  ? spironolactone (ALDACTONE) 25 MG tablet Take 1 tablet (25 mg total) by mouth daily.  ?  ? ?Allergies:   Shellfish allergy and Peanut-containing drug products  ? ?Social History  ? ?Socioeconomic History  ? Marital status: Married  ?  Spouse name: Not on file  ? Number of children: Not on file  ? Years of education: Not on file  ? Highest education level: Not on file  ?Occupational History  ? Not on file  ?Tobacco Use  ? Smoking status: Never  ? Smokeless tobacco: Never  ?Vaping Use  ? Vaping Use: Never used  ?Substance and Sexual Activity  ? Alcohol use: Not Currently  ? Drug use: No  ? Sexual activity: Not on file  ?Other Topics Concern  ? Not on file  ?Social History Narrative  ? Not on file  ? ?Social Determinants of Health  ? ?Financial Resource Strain: Not on file  ?Food Insecurity: Not on file  ?  Transportation Needs: Not on file  ?Physical Activity: Not on file  ?Stress: Not on file  ?Social Connections: Not on file  ?  ? ?Family History: ?The patient's family history includes Heart attack in his father and mother; Hypertension in his father and mother. ?ROS:   ?Please see the history of present illness.    ?All 14 point review of systems negative except as described per history of present illness ? ?EKGs/Labs/Other Studies Reviewed:   ? ? ? ?Recent Labs: ?11/30/2020: ALT 13; BUN 15; Creatinine, Ser 0.88; Hemoglobin 13.4; Platelets 202; Potassium 3.7; Sodium 139  ?Recent Lipid Panel ?   ?Component Value Date/Time  ? CHOL 184 01/31/2008 2114  ? TRIG 62 01/31/2008 2114  ? HDL 52 01/31/2008 2114  ? CHOLHDL 3.5 Ratio  01/31/2008 8177  ? VLDL 12 01/31/2008 2114  ? LDLCALC 120 (H) 01/31/2008 2114  ? ? ?Physical Exam:   ? ?VS:  BP 138/90 (BP Location: Left Arm, Patient Position: Sitting)   Pulse 77   Ht 6' (1.829 m)   Wt 236 lb (107 kg)   SpO2 98%   BMI 32.01 kg/m?    ? ?Wt Readings from Last 3 Encounters:  ?05/16/21 236 lb (107 kg)  ?05/03/21 232 lb (105.2 kg)  ?03/31/21 238 lb (108 kg)  ?  ? ?GEN:  Well nourished, well developed in no acute distress ?HEENT: Normal ?NECK: No JVD; No carotid bruits ?LYMPHATICS: No lymphadenopathy ?CARDIAC: RRR, no murmurs, no rubs, no gallops ?RESPIRATORY:  Clear to auscultation without rales, wheezing or rhonchi  ?ABDOMEN: Soft, non-tender, non-distended ?MUSCULOSKELETAL:  No edema; No deformity  ?SKIN: Warm and dry ?LOWER EXTREMITIES: no swelling ?NEUROLOGIC:  Alert and oriented x 3 ?PSYCHIATRIC:  Normal affect  ? ?ASSESSMENT:   ? ?1. Dilated cardiomyopathy (HCC)   ?2. Chronic combined systolic and diastolic congestive heart failure (HCC)   ?3. Essential hypertension   ?4. ICD (implantable cardioverter-defibrillator) in place   ? ?PLAN:   ? ?In order of problems listed above: ? ?Cardiomyopathy with severely reduced left ventricle ejection fraction, he is on guideline directed medical therapy.  Seems to be hemodynamically compensated.  I will ask him to have Chem-7 done today. ?Essential hypertension: Uncontrolled.  I will increase dose of Imdur to 60 mg daily rest of the medication will be the same.  I asked him to be careful with Naprosyn. ?ICD present I did review interrogation no discharges doing well tolerating well. ?Overall congestive heart failure seems to be compensated, he is New York Heart Association class II ? ? ?Medication Adjustments/Labs and Tests Ordered: ?Current medicines are reviewed at length with the patient today.  Concerns regarding medicines are outlined above.  ?No orders of the defined types were placed in this encounter. ? ?Medication changes: No orders of the  defined types were placed in this encounter. ? ? ?Signed, ?Jeffery Lea, MD, [facility] ?05/16/2021 3:04 PM    ?[facility] ?

## 2021-05-16 NOTE — Patient Instructions (Signed)
Medication Instructions:  ?Your physician has recommended you make the following change in your medication:  ? ?START: Imdur 60 mg daily ? ?*If you need a refill on your cardiac medications before your next appointment, please call your pharmacy* ? ? ?Lab Work: ?Your physician recommends that you return for lab work in:  ? ?Labs today: BMP ? ?If you have labs (blood work) drawn today and your tests are completely normal, you will receive your results only by: ?MyChart Message (if you have MyChart) OR ?A paper copy in the mail ?If you have any lab test that is abnormal or we need to change your treatment, we will call you to review the results. ? ? ?Testing/Procedures: ?None ? ? ?Follow-Up: ?At [facility], you and your health needs are our priority.  As part of our continuing mission to provide you with exceptional heart care, we have created designated Provider Care Teams.  These Care Teams include your primary Cardiologist (physician) and Advanced Practice Providers (APPs -  Physician Assistants and Nurse Practitioners) who all work together to provide you with the care you need, when you need it. ? ?We recommend signing up for the patient portal called "MyChart".  Sign up information is provided on this After Visit Summary.  MyChart is used to connect with patients for Virtual Visits (Telemedicine).  Patients are able to view lab/test results, encounter notes, upcoming appointments, etc.  Non-urgent messages can be sent to your provider as well.   ?To learn more about what you can do with MyChart, go to [email or web address].   ? ?Your next appointment:   ?6 month(s) ? ?The format for your next appointment:   ?In Person ? ?Provider:   ?Jenne Campus, MD  ? ? ?Other Instructions ?None ? ?

## 2021-05-16 NOTE — Addendum Note (Signed)
Addended by: Edwyna Shell I on: 05/16/2021 03:18 PM ? ? Modules accepted: Orders ? ?

## 2021-05-17 LAB — BASIC METABOLIC PANEL
BUN/Creatinine Ratio: 13 (ref 9–20)
BUN: 14 mg/dL (ref 6–24)
CO2: 24 mmol/L (ref 20–29)
Calcium: 9.6 mg/dL (ref 8.7–10.2)
Chloride: 101 mmol/L (ref 96–106)
Creatinine, Ser: 1.11 mg/dL (ref 0.76–1.27)
Glucose: 107 mg/dL — ABNORMAL HIGH (ref 70–99)
Potassium: 4.3 mmol/L (ref 3.5–5.2)
Sodium: 142 mmol/L (ref 134–144)
eGFR: 81 mL/min/{1.73_m2} (ref >59)

## 2021-05-17 LAB — SPECIMEN STATUS REPORT

## 2021-05-20 ENCOUNTER — Telehealth: Payer: Self-pay | Admitting: Cardiology

## 2021-05-20 NOTE — Telephone Encounter (Signed)
Left message for patient to call back  

## 2021-05-20 NOTE — Telephone Encounter (Signed)
Patient is returning call to discuss lab results. 

## 2021-05-21 ENCOUNTER — Encounter: Payer: Self-pay | Admitting: Cardiology

## 2021-05-21 NOTE — Telephone Encounter (Signed)
Left message for patient to call back  

## 2021-05-22 NOTE — Telephone Encounter (Signed)
Patient informed of results.  

## 2021-05-22 NOTE — Telephone Encounter (Signed)
Left message to return call 

## 2021-05-27 ENCOUNTER — Emergency Department (HOSPITAL_BASED_OUTPATIENT_CLINIC_OR_DEPARTMENT_OTHER): Payer: 59

## 2021-05-27 ENCOUNTER — Other Ambulatory Visit: Payer: Self-pay

## 2021-05-27 ENCOUNTER — Encounter (HOSPITAL_BASED_OUTPATIENT_CLINIC_OR_DEPARTMENT_OTHER): Payer: Self-pay | Admitting: *Deleted

## 2021-05-27 ENCOUNTER — Emergency Department (HOSPITAL_BASED_OUTPATIENT_CLINIC_OR_DEPARTMENT_OTHER)
Admission: EM | Admit: 2021-05-27 | Discharge: 2021-05-27 | Disposition: A | Discharge status: Home / Self Care | Payer: 59 | Attending: Emergency Medicine | Admitting: Emergency Medicine

## 2021-05-27 DIAGNOSIS — R531 Weakness: Secondary | ICD-10-CM | POA: Insufficient documentation

## 2021-05-27 DIAGNOSIS — I110 Hypertensive heart disease with heart failure: Secondary | ICD-10-CM | POA: Diagnosis not present

## 2021-05-27 DIAGNOSIS — W1849XA Other slipping, tripping and stumbling without falling, initial encounter: Secondary | ICD-10-CM | POA: Diagnosis not present

## 2021-05-27 DIAGNOSIS — S92421A Displaced fracture of distal phalanx of right great toe, initial encounter for closed fracture: Secondary | ICD-10-CM | POA: Insufficient documentation

## 2021-05-27 DIAGNOSIS — Z91010 Allergy to peanuts: Secondary | ICD-10-CM | POA: Insufficient documentation

## 2021-05-27 DIAGNOSIS — Y92137 Garden or yard on military base as the place of occurrence of the external cause: Secondary | ICD-10-CM | POA: Diagnosis not present

## 2021-05-27 DIAGNOSIS — Y9302 Activity, running: Secondary | ICD-10-CM | POA: Diagnosis not present

## 2021-05-27 DIAGNOSIS — Z79899 Other long term (current) drug therapy: Secondary | ICD-10-CM | POA: Insufficient documentation

## 2021-05-27 DIAGNOSIS — S99921A Unspecified injury of right foot, initial encounter: Secondary | ICD-10-CM | POA: Diagnosis present

## 2021-05-27 DIAGNOSIS — I509 Heart failure, unspecified: Secondary | ICD-10-CM | POA: Insufficient documentation

## 2021-05-27 NOTE — ED Notes (Signed)
Offered ice pack, pt refuses

## 2021-05-27 NOTE — ED Provider Notes (Signed)
?[facility] EMERGENCY DEPARTMENT ?Provider Note ? ? ?CSN: [id number] ?Arrival date & time: 05/27/21  1240 ? ?  ? ?History ? ?Chief Complaint  ?Patient presents with  ? Toe Injury  ? ? ?Jeffery Glenn is a 51 y.o. male. ? ?51 year old male presents with complaint of injury to his right great toe.  Patient states that he was running through the yard to break up a fight between his dogs last night when he stubbed his toe on a tree stump.  Patient reports pain in the right great toe only, pain is worse with trying to ambulate.  Denies any bleeding or skin injuries.  Past medical history of hypertension, CHF.  Patient is not anticoagulated. ? ? ?  ? ?Home Medications ?Prior to Admission medications   ?Medication Sig Start Date End Date Taking? Authorizing Provider  ?atorvastatin (LIPITOR) 20 MG tablet Take 1 tablet (20 mg total) by mouth daily. 03/05/20  Yes Georgeanna Lea, MD  ?carvedilol (COREG) 25 MG tablet Take 1 tablet (25 mg total) by mouth 2 (two) times daily with a meal. 05/06/21  Yes Georgeanna Lea, MD  ?furosemide (LASIX) 40 MG tablet Take 1 tablet (40 mg total) by mouth daily. 05/06/21  Yes Georgeanna Lea, MD  ?hydrALAZINE (APRESOLINE) 100 MG tablet Take 0.5 tablets (50 mg total) by mouth 3 (three) times daily. 03/31/21  Yes Camnitz, Andree Coss, MD  ?isosorbide mononitrate (IMDUR) 60 MG 24 hr tablet Take 1 tablet (60 mg total) by mouth daily. 05/16/21  Yes Georgeanna Lea, MD  ?naproxen (NAPROSYN) 375 MG tablet Take 1 tablet (375 mg total) by mouth 2 (two) times daily. 05/03/21  Yes Linwood Dibbles, MD  ?sacubitril-valsartan (ENTRESTO) 97-103 MG Take 1 tablet by mouth 2 (two) times daily. 05/06/21  Yes Georgeanna Lea, MD  ?spironolactone (ALDACTONE) 25 MG tablet Take 1 tablet (25 mg total) by mouth daily. 01/16/21  Yes Georgeanna Lea, MD  ?amLODipine (NORVASC) 10 MG tablet Take 1 tablet (10 mg total) by mouth daily. 05/06/21   Georgeanna Lea, MD  ?   ? ?Allergies    ?Shellfish  allergy and Peanut-containing drug products   ? ?Review of Systems   ?Review of Systems ?Negative except as per HPI ?Physical Exam ?Updated Vital Signs ?BP (!) 164/108 (BP Location: Right Arm)   Pulse 74   Temp 98 ?F (36.7 ?C) (Oral)   Resp 18   Ht 6' (1.829 m)   Wt 107 kg   SpO2 96%   BMI 31.99 kg/m?  ?Physical Exam ?Vitals and nursing note reviewed.  ?Constitutional:   ?   General: He is not in acute distress. ?   Appearance: He is well-developed. He is not diaphoretic.  ?HENT:  ?   Head: Normocephalic and atraumatic.  ?Cardiovascular:  ?   Pulses: Normal pulses.  ?Pulmonary:  ?   Effort: Pulmonary effort is normal.  ?Musculoskeletal:     ?   General: Swelling and tenderness present. No deformity.  ?   Comments: Ecchymosis, swelling, tenderness to the right great toe.  Limited extension of the toe, possibly secondary to pain versus injury.  Sensation intact distally.  ?Skin: ?   General: Skin is warm and dry.  ?   Findings: Bruising present. No erythema or rash.  ?Neurological:  ?   Mental Status: He is alert and oriented to person, place, and time.  ?   Sensory: No sensory deficit.  ?   Motor: Weakness present.  ?Psychiatric:     ?  Behavior: Behavior normal.  ? ? ?ED Results / Procedures / Treatments   ?Labs ?(all labs ordered are listed, but only abnormal results are displayed) ?Labs Reviewed - No data to display ? ?EKG ?None ? ?Radiology ?DG Toe Great Right ? ?Result Date: 05/27/2021 ?CLINICAL DATA:  Right first toe injury. EXAM: RIGHT GREAT TOE COMPARISON:  Right foot films on 10/27/2019 FINDINGS: Acute fracture of the right first distal phalanx with displaced volar base of the distal phalanx best seen on the lateral view with fracture plane extending into the interphalangeal joint. Maximal degree of rotational displacement is approximately 4 mm. There may be an additional nondisplaced component of fracture along the base of the distal phalanx towards the second toe. No associated dislocation or other  injuries. No soft tissue foreign body. IMPRESSION: Acute, displaced fracture of the right first distal phalanx along its volar base with intra-articular extension. Suspect additional nondisplaced component of fracture along the base of the distal phalanx towards the second toe. Electronically Signed   By: Irish Lack M.D.   On: 05/27/2021 13:50   ? ?Procedures ?Procedures  ? ? ?Medications Ordered in ED ?Medications - No data to display ? ?ED Course/ Medical Decision Making/ A&P ?  ?                        ?Medical Decision Making ?Amount and/or Complexity of Data Reviewed ?Radiology: ordered. ? ? ?51 year old male presents with injury to the right great toe as above.  He has sensation intact with brisk upper refill present, does have weakness on extension.  X-ray shows an acute displaced fracture of the right distal phalanx with intra-articular extension.  Patient is placed in a postop shoe and given crutches, advised nonweightbearing and referred to orthopedics for follow-up.  Discussed importance of follow-up with orthopedics to ensure proper healing of his injury. ? ? ? ? ? ? ? ?Final Clinical Impression(s) / ED Diagnoses ?Final diagnoses:  ?Displaced fracture of distal phalanx of right great toe, initial encounter for closed fracture  ? ? ?Rx / DC Orders ?ED Discharge Orders   ? ? None  ? ?  ? ? ?  ?Jeannie Fend, PA-C ?05/27/21 1451 ? ?  ?Alvira Monday, MD ?05/28/21 2259 ? ?

## 2021-05-27 NOTE — ED Triage Notes (Signed)
Right great toe injury. He tripped over a tree stump. ?

## 2021-05-27 NOTE — Discharge Instructions (Signed)
Use crutches. ?Follow up with orthopedics, call to schedule an appointment.  ?

## 2021-05-28 ENCOUNTER — Ambulatory Visit (INDEPENDENT_AMBULATORY_CARE_PROVIDER_SITE_OTHER): Payer: 59 | Admitting: Orthopaedic Surgery

## 2021-05-28 ENCOUNTER — Encounter: Payer: Self-pay | Admitting: Orthopaedic Surgery

## 2021-05-28 DIAGNOSIS — S92421A Displaced fracture of distal phalanx of right great toe, initial encounter for closed fracture: Secondary | ICD-10-CM | POA: Diagnosis not present

## 2021-05-28 NOTE — Progress Notes (Signed)
Remote ICD transmission.   

## 2021-05-28 NOTE — Progress Notes (Signed)
The patient is a 51 year old gentleman who is referred from emergency room after fracturing his right great toe of the proximal phalanx.  The injury happened 2 days ago.  He was in croq type of shoes when he accidentally kicked against a stump.  He then went to the emergency room yesterday and was found to have a fracture of the proximal veins of the great toe.  He was placed appropriate in a postop shoe.  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  Due to the swelling of his foot he cannot get in the boot yet.  He only has pain with some weightbearing. ? ?Examination of his right foot shows the nail is intact on the great toe.  He can flex and extend the great toe.  It appears ligamentously stable but painful and swollen. ? ?X-rays on the canopy system of the right great to show a distal phalanx fracture of the great toe that is on the dorsal aspect.  It is almost as if there is a mallet type of fracture.  It does involve the articular surface but is a small piece.  The MTP joint is congruent and intact. ? ?A fracture such as this should do well.  I explained to him that we put all her weight through the MTP joint and that even a fracture such as this usually does well with nonoperative treatment.  He can weight-bear as tolerated in a postop shoe.  I will give him a note to keep him out of work for the next 4 weeks since he will not be able to get in his work boots yet.  We will see him back in 4 weeks with a repeat 2 views of the right great toe.  All question concerns were answered and addressed. ?

## 2021-06-02 ENCOUNTER — Telehealth: Payer: Self-pay | Admitting: Orthopaedic Surgery

## 2021-06-02 NOTE — Telephone Encounter (Signed)
Received $25.00 cash,medical records release form and FMLA paperwork from patient/Forwarding to [employer] today 

## 2021-06-07 ENCOUNTER — Other Ambulatory Visit: Payer: Self-pay

## 2021-06-07 ENCOUNTER — Emergency Department (HOSPITAL_BASED_OUTPATIENT_CLINIC_OR_DEPARTMENT_OTHER)
Admission: EM | Admit: 2021-06-07 | Discharge: 2021-06-07 | Disposition: A | Discharge status: Home / Self Care | Payer: Medicaid Other | Attending: Emergency Medicine | Admitting: Emergency Medicine

## 2021-06-07 ENCOUNTER — Encounter (HOSPITAL_BASED_OUTPATIENT_CLINIC_OR_DEPARTMENT_OTHER): Payer: Self-pay | Admitting: Emergency Medicine

## 2021-06-07 DIAGNOSIS — K029 Dental caries, unspecified: Secondary | ICD-10-CM | POA: Insufficient documentation

## 2021-06-07 DIAGNOSIS — Z91010 Allergy to peanuts: Secondary | ICD-10-CM | POA: Diagnosis not present

## 2021-06-07 DIAGNOSIS — K047 Periapical abscess without sinus: Secondary | ICD-10-CM | POA: Diagnosis not present

## 2021-06-07 DIAGNOSIS — R220 Localized swelling, mass and lump, head: Secondary | ICD-10-CM | POA: Diagnosis present

## 2021-06-07 MED ORDER — CLINDAMYCIN PHOSPHATE 900 MG/50ML IV SOLN
900.0000 mg | Freq: Once | INTRAVENOUS | Status: AC
  Administered 2021-06-07: 900 mg via INTRAVENOUS
  Filled 2021-06-07: qty 50

## 2021-06-07 MED ORDER — HYDROCODONE-ACETAMINOPHEN 5-325 MG PO TABS: 1.0000 | ORAL_TABLET | ORAL | 0 refills | Status: DC | PRN

## 2021-06-07 MED ORDER — CLINDAMYCIN HCL 150 MG PO CAPS: 300.0000 mg | ORAL_CAPSULE | Freq: Four times a day (QID) | ORAL | 0 refills | Status: DC

## 2021-06-07 NOTE — ED Triage Notes (Signed)
Pt woke up and left cheek very swollen- has rotten tooth on that side. States it was fine last night just mild discomfort.  ?

## 2021-06-07 NOTE — ED Provider Notes (Signed)
?[facility] EMERGENCY DEPARTMENT ?Provider Note ? ? ?CSN: [id number] ?Arrival date & time: 06/07/21  [id number] ? ?  ? ?History ? ?Chief Complaint  ?Patient presents with  ? Facial Swelling  ? ? ?Jeffery Glenn is a 51 y.o. male. ? ?Patient presents to the emergency department for evaluation of left facial swelling.  Patient reports that he has a history of dental problems.  He has a known left upper tooth that needs to be removed.  He has had problems with it from time to time.  Patient reports he woke up this morning with significant swelling of the left side of his face. ? ? ?  ? ?Home Medications ?Prior to Admission medications   ?Medication Sig Start Date End Date Taking? Authorizing Provider  ?clindamycin (CLEOCIN) 150 MG capsule Take 2 capsules (300 mg total) by mouth 4 (four) times daily. 06/07/21  Yes Taniqua Issa, Gwenyth Allegra, MD  ?HYDROcodone-acetaminophen (NORCO/VICODIN) 5-325 MG tablet Take 1 tablet by mouth every 4 (four) hours as needed for moderate pain. 06/07/21  Yes Andrew Soria, Gwenyth Allegra, MD  ?amLODipine (NORVASC) 10 MG tablet Take 1 tablet (10 mg total) by mouth daily. 05/06/21   Park Liter, MD  ?atorvastatin (LIPITOR) 20 MG tablet Take 1 tablet (20 mg total) by mouth daily. 03/05/20   Park Liter, MD  ?carvedilol (COREG) 25 MG tablet Take 1 tablet (25 mg total) by mouth 2 (two) times daily with a meal. 05/06/21   Park Liter, MD  ?furosemide (LASIX) 40 MG tablet Take 1 tablet (40 mg total) by mouth daily. 05/06/21   Park Liter, MD  ?hydrALAZINE (APRESOLINE) 100 MG tablet Take 0.5 tablets (50 mg total) by mouth 3 (three) times daily. 03/31/21   Camnitz, Ocie Doyne, MD  ?isosorbide mononitrate (IMDUR) 60 MG 24 hr tablet Take 1 tablet (60 mg total) by mouth daily. 05/16/21   Park Liter, MD  ?naproxen (NAPROSYN) 375 MG tablet Take 1 tablet (375 mg total) by mouth 2 (two) times daily. 05/03/21   Dorie Rank, MD  ?sacubitril-valsartan (ENTRESTO) 97-103 MG Take 1 tablet by  mouth 2 (two) times daily. 05/06/21   Park Liter, MD  ?spironolactone (ALDACTONE) 25 MG tablet Take 1 tablet (25 mg total) by mouth daily. 01/16/21   Park Liter, MD  ?   ? ?Allergies    ?Shellfish allergy and Peanut-containing drug products   ? ?Review of Systems   ?Review of Systems  ?HENT:  Positive for dental problem.   ? ?Physical Exam ?Updated Vital Signs ?BP (!) 161/106 (BP Location: Right Arm)   Pulse 72   Temp 98.1 ?F (36.7 ?C) (Oral)   Resp 18   SpO2 98%  ?Physical Exam ?Vitals and nursing note reviewed.  ?Constitutional:   ?   General: He is not in acute distress. ?   Appearance: He is well-developed.  ?HENT:  ?   Head: Normocephalic and atraumatic.  ?   Jaw: There is normal jaw occlusion.  ?   Mouth/Throat:  ?   Mouth: Mucous membranes are moist.  ?   Dentition: Abnormal dentition. Dental tenderness and dental caries present.  ? ?   Comments: Left maxillary swelling, no fluctuance ?Eyes:  ?   General: Vision grossly intact. Gaze aligned appropriately.  ?   Extraocular Movements: Extraocular movements intact.  ?   Conjunctiva/sclera: Conjunctivae normal.  ?Cardiovascular:  ?   Rate and Rhythm: Normal rate and regular rhythm.  ?   Pulses: Normal pulses.  ?  Heart sounds: Normal heart sounds, S1 normal and S2 normal. No murmur heard. ?  No friction rub. No gallop.  ?Pulmonary:  ?   Effort: Pulmonary effort is normal. No respiratory distress.  ?   Breath sounds: Normal breath sounds.  ?Abdominal:  ?   Palpations: Abdomen is soft.  ?   Tenderness: There is no abdominal tenderness. There is no guarding or rebound.  ?   Hernia: No hernia is present.  ?Musculoskeletal:     ?   General: No swelling.  ?   Cervical back: Full passive range of motion without pain, normal range of motion and neck supple. No pain with movement, spinous process tenderness or muscular tenderness. Normal range of motion.  ?   Right lower leg: No edema.  ?   Left lower leg: No edema.  ?Skin: ?   General: Skin is warm  and dry.  ?   Capillary Refill: Capillary refill takes less than 2 seconds.  ?   Findings: No ecchymosis, erythema, lesion or wound.  ?Neurological:  ?   Mental Status: He is alert and oriented to person, place, and time.  ?   GCS: GCS eye subscore is 4. GCS verbal subscore is 5. GCS motor subscore is 6.  ?   Cranial Nerves: Cranial nerves 2-12 are intact.  ?   Sensory: Sensation is intact.  ?   Motor: Motor function is intact. No weakness or abnormal muscle tone.  ?   Coordination: Coordination is intact.  ?Psychiatric:     ?   Mood and Affect: Mood normal.     ?   Speech: Speech normal.     ?   Behavior: Behavior normal.  ? ? ?ED Results / Procedures / Treatments   ?Labs ?(all labs ordered are listed, but only abnormal results are displayed) ?Labs Reviewed - No data to display ? ?EKG ?None ? ?Radiology ?No results found. ? ?Procedures ?Procedures  ? ? ?Medications Ordered in ED ?Medications  ?clindamycin (CLEOCIN) IVPB 900 mg (has no administration in time range)  ? ? ?ED Course/ Medical Decision Making/ A&P ?  ?                        ?Medical Decision Making ? ?Patient presents to the emergency department for evaluation of facial swelling. ? ?Dental abscess, Ludewig's angina, facial cellulitis considered. ? ?Oral examination reveals poor dentition.  There is gingival swelling of the left upper adjacent to dental carry.  No fluctuance that would facilitate drainage.  Floor of the mouth is normal, no swelling of the tongue, under the tongue, no concern for Ludewig's angina.  We will treat for dental abscess. ? ? ? ? ? ? ? ?Final Clinical Impression(s) / ED Diagnoses ?Final diagnoses:  ?None  ? ? ?Rx / DC Orders ?ED Discharge Orders   ? ?      Ordered  ?  HYDROcodone-acetaminophen (NORCO/VICODIN) 5-325 MG tablet  Every 4 hours PRN       ? 06/07/21 0653  ?  clindamycin (CLEOCIN) 150 MG capsule  4 times daily       ? 06/07/21 0653  ? ?  ?  ? ?  ? ? ?  ?Orpah Greek, MD ?06/07/21 [phone] ? ?

## 2021-06-23 ENCOUNTER — Ambulatory Visit (INDEPENDENT_AMBULATORY_CARE_PROVIDER_SITE_OTHER): Payer: Medicaid Other

## 2021-06-23 ENCOUNTER — Ambulatory Visit (INDEPENDENT_AMBULATORY_CARE_PROVIDER_SITE_OTHER): Payer: Medicaid Other | Admitting: Orthopaedic Surgery

## 2021-06-23 ENCOUNTER — Encounter: Payer: Self-pay | Admitting: Orthopaedic Surgery

## 2021-06-23 DIAGNOSIS — S92421A Displaced fracture of distal phalanx of right great toe, initial encounter for closed fracture: Secondary | ICD-10-CM

## 2021-06-23 NOTE — Progress Notes (Signed)
The patient is now 3 weeks into an injury of his right great toe in which she sustained a fracture of the distal phalanx.  He is ambulating in regular crocs but he cannot put on his work boots yet. ? ?There is still some swelling at the IP joint of his right great toe.  It is clinically intact in terms of no dislocation.  His nail is intact as well.  There is no pain at the MTP joint. ? ?2 views of the right great toe show that the fracture is being to heal but it is displaced.  This is not worrisome based on this joint compared to the MTP joint. ? ?We will keep him out of work until May 1 when he will hopefully be able to put on his work boots easier and can resume work activities then.  I will see him back for final visit in about 4 weeks with a repeat 2 views of the right great toe. ?

## 2021-07-07 ENCOUNTER — Telehealth: Payer: Self-pay | Admitting: Orthopaedic Surgery

## 2021-07-07 NOTE — Telephone Encounter (Signed)
Patient's wife called. Patient has not been able to work do to his foot being swollen. Would like to know if he could get a work note to be out until next appointment, her call back number is [phone] ?

## 2021-07-07 NOTE — Telephone Encounter (Signed)
Wife aware note written  ?

## 2021-07-21 ENCOUNTER — Encounter: Payer: Self-pay | Admitting: Orthopaedic Surgery

## 2021-07-21 ENCOUNTER — Ambulatory Visit: Payer: Medicaid Other | Admitting: Orthopaedic Surgery

## 2021-07-21 ENCOUNTER — Ambulatory Visit (INDEPENDENT_AMBULATORY_CARE_PROVIDER_SITE_OTHER): Payer: Medicaid Other

## 2021-07-21 DIAGNOSIS — S92421A Displaced fracture of distal phalanx of right great toe, initial encounter for closed fracture: Secondary | ICD-10-CM

## 2021-07-21 NOTE — Progress Notes (Signed)
The patient is returning for follow-up having sustained a fracture to his right great toe of the distal phalanx.  He has been out of work since he could not put his steel toed boot on due to foot swelling.  He says he is doing great at this point is ready to return to work.  He denies any difficulty with his foot. ? ?His right great toe looks good overall.  Nail is intact.  There is no motion deficits and minimal pain. ? ?X-rays of the great toe show the fracture is healing nicely. ? ?At this point I did give him a note to allow him to return to full work duties starting today without restrictions.  All question concerns were answered and addressed.  Follow-up is as needed. ?

## 2021-08-01 ENCOUNTER — Emergency Department (HOSPITAL_BASED_OUTPATIENT_CLINIC_OR_DEPARTMENT_OTHER)
Admission: EM | Admit: 2021-08-01 | Discharge: 2021-08-01 | Disposition: A | Discharge status: Home / Self Care | Payer: Medicaid Other | Attending: Emergency Medicine | Admitting: Emergency Medicine

## 2021-08-01 ENCOUNTER — Encounter (HOSPITAL_BASED_OUTPATIENT_CLINIC_OR_DEPARTMENT_OTHER): Payer: Self-pay | Admitting: Emergency Medicine

## 2021-08-01 DIAGNOSIS — Z91010 Allergy to peanuts: Secondary | ICD-10-CM | POA: Insufficient documentation

## 2021-08-01 DIAGNOSIS — L02214 Cutaneous abscess of groin: Secondary | ICD-10-CM | POA: Diagnosis present

## 2021-08-01 DIAGNOSIS — I10 Essential (primary) hypertension: Secondary | ICD-10-CM | POA: Diagnosis not present

## 2021-08-01 DIAGNOSIS — Z79899 Other long term (current) drug therapy: Secondary | ICD-10-CM | POA: Insufficient documentation

## 2021-08-01 DIAGNOSIS — N5089 Other specified disorders of the male genital organs: Secondary | ICD-10-CM | POA: Diagnosis not present

## 2021-08-01 DIAGNOSIS — R590 Localized enlarged lymph nodes: Secondary | ICD-10-CM | POA: Insufficient documentation

## 2021-08-01 LAB — URINALYSIS, ROUTINE W REFLEX MICROSCOPIC
Bilirubin Urine: NEGATIVE
Glucose, UA: NEGATIVE mg/dL
Hgb urine dipstick: NEGATIVE
Ketones, ur: NEGATIVE mg/dL
Leukocytes,Ua: NEGATIVE
Nitrite: NEGATIVE
Protein, ur: NEGATIVE mg/dL
Specific Gravity, Urine: 1.025 (ref 1.005–1.030)
pH: 5.5 (ref 5.0–8.0)

## 2021-08-01 LAB — RPR
RPR Ser Ql: REACTIVE — AB
RPR Titer: 1:8 {titer}

## 2021-08-01 LAB — HIV ANTIBODY (ROUTINE TESTING W REFLEX): HIV Screen 4th Generation wRfx: NONREACTIVE

## 2021-08-01 MED ORDER — DOXYCYCLINE HYCLATE 100 MG PO CAPS: 100.0000 mg | ORAL_CAPSULE | Freq: Two times a day (BID) | ORAL | 0 refills | Status: DC

## 2021-08-01 NOTE — ED Provider Notes (Signed)
[facility] EMERGENCY DEPARTMENT Provider Note   CSN: [id number] Arrival date & time: 08/01/21  5009     History  Chief Complaint  Patient presents with   Abscess    Jeffery Glenn is a 51 y.o. male.  The history is provided by the patient and medical records.  Abscess Jeffery Glenn is a 51 y.o. male who presents to the Emergency Department complaining of bumping groin.  He presents to the emergency department complaining of a bump in his groin that he noticed about 1 week ago.  He noticed this about the same time he noticed a bump on the side of his penis.  He states that this is mildly tender but not significantly painful.  No associated fever, chest pain, shortness of breath, nausea, vomiting, abdominal pain, penile discharge, night sweats.  No new sexual partners.  He has a history of cardiomyopathy, hypertension.  No prior similar symptoms.    Home Medications Prior to Admission medications   Medication Sig Start Date End Date Taking? Authorizing Provider  doxycycline (VIBRAMYCIN) 100 MG capsule Take 1 capsule (100 mg total) by mouth 2 (two) times daily. 08/01/21  Yes Tilden Fossa, MD  amLODipine (NORVASC) 10 MG tablet Take 1 tablet (10 mg total) by mouth daily. 05/06/21   Georgeanna Lea, MD  atorvastatin (LIPITOR) 20 MG tablet Take 1 tablet (20 mg total) by mouth daily. 03/05/20   Georgeanna Lea, MD  carvedilol (COREG) 25 MG tablet Take 1 tablet (25 mg total) by mouth 2 (two) times daily with a meal. 05/06/21   Georgeanna Lea, MD  clindamycin (CLEOCIN) 150 MG capsule Take 2 capsules (300 mg total) by mouth 4 (four) times daily. 06/07/21   Gilda Crease, MD  furosemide (LASIX) 40 MG tablet Take 1 tablet (40 mg total) by mouth daily. 05/06/21   Georgeanna Lea, MD  hydrALAZINE (APRESOLINE) 100 MG tablet Take 0.5 tablets (50 mg total) by mouth 3 (three) times daily. 03/31/21   Camnitz, Andree Coss, MD  HYDROcodone-acetaminophen (NORCO/VICODIN) 5-325 MG  tablet Take 1 tablet by mouth every 4 (four) hours as needed for moderate pain. 06/07/21   Gilda Crease, MD  isosorbide mononitrate (IMDUR) 60 MG 24 hr tablet Take 1 tablet (60 mg total) by mouth daily. 05/16/21   Georgeanna Lea, MD  naproxen (NAPROSYN) 375 MG tablet Take 1 tablet (375 mg total) by mouth 2 (two) times daily. 05/03/21   Linwood Dibbles, MD  sacubitril-valsartan (ENTRESTO) 97-103 MG Take 1 tablet by mouth 2 (two) times daily. 05/06/21   Georgeanna Lea, MD  spironolactone (ALDACTONE) 25 MG tablet Take 1 tablet (25 mg total) by mouth daily. 01/16/21   Georgeanna Lea, MD      Allergies    Shellfish allergy and Peanut-containing drug products    Review of Systems   Review of Systems  All other systems reviewed and are negative.  Physical Exam Updated Vital Signs BP (!) 184/122 (BP Location: Right Arm)   Pulse 72   Temp 97.8 F (36.6 C) (Oral)   Resp 18   Ht 6' (1.829 m)   Wt 104.3 kg   SpO2 100%   BMI 31.19 kg/m  Physical Exam Vitals and nursing note reviewed.  Constitutional:      Appearance: He is well-developed.  HENT:     Head: Normocephalic and atraumatic.  Cardiovascular:     Rate and Rhythm: Normal rate and regular rhythm.  Pulmonary:     Effort:  Pulmonary effort is normal. No respiratory distress.  Abdominal:     Palpations: Abdomen is soft.     Tenderness: There is no abdominal tenderness. There is no guarding or rebound.  Genitourinary:    Comments: There is a 1 cm papule with central clearing to the right lateral aspect of the penile shaft that is minimally tender to palpation.  There is no penile discharge.  There is a left inguinal nodule that is approximately 2 to 3 cm in size consistent with enlarged lymph node.  No overlying erythema. Musculoskeletal:        General: No tenderness.  Skin:    General: Skin is warm and dry.  Neurological:     Mental Status: He is alert and oriented to person, place, and time.  Psychiatric:         Behavior: Behavior normal.    ED Results / Procedures / Treatments   Labs (all labs ordered are listed, but only abnormal results are displayed) Labs Reviewed  URINALYSIS, ROUTINE W REFLEX MICROSCOPIC  RPR  HIV ANTIBODY (ROUTINE TESTING W REFLEX)  GC/CHLAMYDIA PROBE AMP ([place]) NOT AT [facility]    EKG None  Radiology No results found.  Procedures Procedures    Medications Ordered in ED Medications - No data to display  ED Course/ Medical Decision Making/ A&P                           Medical Decision Making Amount and/or Complexity of Data Reviewed Labs: ordered.  Risk Prescription drug management.   Patient here for evaluation of genital lesion, swelling in the groin.  On examination he has an isolated papule to the lateral aspect of the penis with a central clearing that is not exactly an ulceration.  He also has a large and tender inguinal lymph node.  No evidence of abscess.  Discussed with patient concern for STI.  Discussed variety of different diagnoses.  Will prescribe doxycycline that he is to take if his syphilis test is negative or if his symptoms worsen for empiric treatment for possible LGV.  Discussed close return precautions if he has progressive or concerning symptoms.        Final Clinical Impression(s) / ED Diagnoses Final diagnoses:  Inguinal lymphadenopathy  Genital lesion, male    Rx / DC Orders ED Discharge Orders          Ordered    doxycycline (VIBRAMYCIN) 100 MG capsule  2 times daily        08/01/21 0604              Tilden Fossa, MD 08/01/21 [phone]

## 2021-08-01 NOTE — Discharge Instructions (Addendum)
The cause of your enlarged lymph node is not clear today.  It is important to get rechecked if your symptoms worsen.  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  You were given a prescription for doxycycline.  Start this if you are positive for chlamydia or if your symptoms begin to worsen.  If you develop fevers or new concerning symptoms please get rechecked immediately.  If you do not start to improve with treatment please get rechecked by your family doctor.

## 2021-08-01 NOTE — ED Triage Notes (Addendum)
Knott on groin left in crease of leg. States also had a bump on penis would like STD test as well.

## 2021-08-05 LAB — GC/CHLAMYDIA PROBE AMP (~~LOC~~) NOT AT ARMC
Chlamydia: NEGATIVE
Comment: NEGATIVE
Comment: NORMAL
Neisseria Gonorrhea: NEGATIVE

## 2021-08-05 LAB — T.PALLIDUM AB, TOTAL: T Pallidum Abs: REACTIVE — AB

## 2021-08-19 ENCOUNTER — Other Ambulatory Visit: Payer: Self-pay

## 2021-08-19 ENCOUNTER — Emergency Department (HOSPITAL_BASED_OUTPATIENT_CLINIC_OR_DEPARTMENT_OTHER): Payer: Medicaid Other

## 2021-08-19 ENCOUNTER — Encounter (HOSPITAL_BASED_OUTPATIENT_CLINIC_OR_DEPARTMENT_OTHER): Payer: Self-pay

## 2021-08-19 ENCOUNTER — Emergency Department (HOSPITAL_BASED_OUTPATIENT_CLINIC_OR_DEPARTMENT_OTHER)
Admission: EM | Admit: 2021-08-19 | Discharge: 2021-08-19 | Disposition: A | Discharge status: Home / Self Care | Payer: Medicaid Other | Attending: Emergency Medicine | Admitting: Emergency Medicine

## 2021-08-19 DIAGNOSIS — Z79899 Other long term (current) drug therapy: Secondary | ICD-10-CM | POA: Insufficient documentation

## 2021-08-19 DIAGNOSIS — Z20822 Contact with and (suspected) exposure to covid-19: Secondary | ICD-10-CM | POA: Insufficient documentation

## 2021-08-19 DIAGNOSIS — R7989 Other specified abnormal findings of blood chemistry: Secondary | ICD-10-CM | POA: Diagnosis not present

## 2021-08-19 DIAGNOSIS — Z91010 Allergy to peanuts: Secondary | ICD-10-CM | POA: Diagnosis not present

## 2021-08-19 DIAGNOSIS — R0789 Other chest pain: Secondary | ICD-10-CM | POA: Insufficient documentation

## 2021-08-19 DIAGNOSIS — I5030 Unspecified diastolic (congestive) heart failure: Secondary | ICD-10-CM | POA: Diagnosis not present

## 2021-08-19 DIAGNOSIS — I110 Hypertensive heart disease with heart failure: Secondary | ICD-10-CM | POA: Diagnosis not present

## 2021-08-19 DIAGNOSIS — Z950 Presence of cardiac pacemaker: Secondary | ICD-10-CM | POA: Diagnosis not present

## 2021-08-19 DIAGNOSIS — R0602 Shortness of breath: Secondary | ICD-10-CM | POA: Insufficient documentation

## 2021-08-19 DIAGNOSIS — R778 Other specified abnormalities of plasma proteins: Secondary | ICD-10-CM | POA: Diagnosis not present

## 2021-08-19 LAB — CBC WITH DIFFERENTIAL/PLATELET
Abs Immature Granulocytes: 0.01 10*3/uL (ref 0.00–0.07)
Basophils Absolute: 0.1 10*3/uL (ref 0.0–0.1)
Basophils Relative: 1 %
Eosinophils Absolute: 0.4 10*3/uL (ref 0.0–0.5)
Eosinophils Relative: 7 %
HCT: 39.1 % (ref 39.0–52.0)
Hemoglobin: 12.7 g/dL — ABNORMAL LOW (ref 13.0–17.0)
Immature Granulocytes: 0 %
Lymphocytes Relative: 29 %
Lymphs Abs: 1.9 10*3/uL (ref 0.7–4.0)
MCH: 26.7 pg (ref 26.0–34.0)
MCHC: 32.5 g/dL (ref 30.0–36.0)
MCV: 82.1 fL (ref 80.0–100.0)
Monocytes Absolute: 0.6 10*3/uL (ref 0.1–1.0)
Monocytes Relative: 9 %
Neutro Abs: 3.6 10*3/uL (ref 1.7–7.7)
Neutrophils Relative %: 54 %
Platelets: 246 10*3/uL (ref 150–400)
RBC: 4.76 MIL/uL (ref 4.22–5.81)
RDW: 15.2 % (ref 11.5–15.5)
WBC: 6.6 10*3/uL (ref 4.0–10.5)
nRBC: 0 % (ref 0.0–0.2)

## 2021-08-19 LAB — BASIC METABOLIC PANEL
Anion gap: 7 (ref 5–15)
BUN: 16 mg/dL (ref 6–20)
CO2: 26 mmol/L (ref 22–32)
Calcium: 9.1 mg/dL (ref 8.9–10.3)
Chloride: 105 mmol/L (ref 98–111)
Creatinine, Ser: 0.99 mg/dL (ref 0.61–1.24)
GFR, Estimated: >60 mL/min (ref >60)
Glucose, Bld: 103 mg/dL — ABNORMAL HIGH (ref 70–99)
Potassium: 4 mmol/L (ref 3.5–5.1)
Sodium: 138 mmol/L (ref 135–145)

## 2021-08-19 LAB — TROPONIN I (HIGH SENSITIVITY)
Troponin I (High Sensitivity): 42 ng/L — ABNORMAL HIGH (ref <18)
Troponin I (High Sensitivity): 30 ng/L — ABNORMAL HIGH (ref <18)

## 2021-08-19 LAB — BRAIN NATRIURETIC PEPTIDE: B Natriuretic Peptide: 803.3 pg/mL — ABNORMAL HIGH (ref 0.0–100.0)

## 2021-08-19 LAB — SARS CORONAVIRUS 2 BY RT PCR: SARS Coronavirus 2 by RT PCR: NEGATIVE

## 2021-08-19 MED ORDER — FUROSEMIDE 40 MG PO TABS
40.0000 mg | ORAL_TABLET | Freq: Once | ORAL | Status: AC
  Administered 2021-08-19: 40 mg via ORAL
  Filled 2021-08-19: qty 1

## 2021-08-19 NOTE — ED Triage Notes (Addendum)
States was lying in bed and started feeling SOB this morning. States has had generalized body pain, denies other symptoms.  States hasnt been taking diuretics.

## 2021-08-19 NOTE — ED Provider Notes (Signed)
[facility] EMERGENCY DEPARTMENT Provider Note   CSN: [id number] Arrival date & time: 08/19/21  1534     History  Chief Complaint  Patient presents with   Shortness of Breath    Jeffery Glenn is a 51 y.o. male with history of atypical chest pain, essential hypertension, cardiomyopathy with ejection fraction 30 to 35% as of January 2021, CHF currently on 40 mg furosemide daily, ICD implant in September 2022.  Patient reports ED for evaluation of shortness of breath.  The patient reports that he works 12-hour shifts overnight.  Patient states that this morning upon getting off of work, he went home to lay down to sleep when he had sudden onset shortness of breath and "chest heaviness".  Patient reports that he has not been compliant on his diuretic medication daily and consistently due to the fact that it makes him urinate excessively.  Patient adds that he feels as if his lower extremities/ankles have been more swollen recently.  Patient reports he last took his diuretic medication on Sunday.  The patient also states that at this time he had sudden onset of body aches and chills.  The patient denies any fevers, cough, sore throat, sinus pressure congestion.  Patient denies any chest pain, nausea, vomiting, diarrhea, abdominal pain, blurred vision, headache.   Shortness of Breath Associated symptoms: no abdominal pain, no chest pain, no cough, no fever, no headaches, no sore throat and no vomiting        Home Medications Prior to Admission medications   Medication Sig Start Date End Date Taking? Authorizing Provider  amLODipine (NORVASC) 10 MG tablet Take 1 tablet (10 mg total) by mouth daily. 05/06/21   Georgeanna Lea, MD  atorvastatin (LIPITOR) 20 MG tablet Take 1 tablet (20 mg total) by mouth daily. 03/05/20   Georgeanna Lea, MD  carvedilol (COREG) 25 MG tablet Take 1 tablet (25 mg total) by mouth 2 (two) times daily with a meal. 05/06/21   Georgeanna Lea, MD   clindamycin (CLEOCIN) 150 MG capsule Take 2 capsules (300 mg total) by mouth 4 (four) times daily. 06/07/21   Gilda Crease, MD  doxycycline (VIBRAMYCIN) 100 MG capsule Take 1 capsule (100 mg total) by mouth 2 (two) times daily. 08/01/21   Tilden Fossa, MD  furosemide (LASIX) 40 MG tablet Take 1 tablet (40 mg total) by mouth daily. 05/06/21   Georgeanna Lea, MD  hydrALAZINE (APRESOLINE) 100 MG tablet Take 0.5 tablets (50 mg total) by mouth 3 (three) times daily. 03/31/21   Camnitz, Andree Coss, MD  HYDROcodone-acetaminophen (NORCO/VICODIN) 5-325 MG tablet Take 1 tablet by mouth every 4 (four) hours as needed for moderate pain. 06/07/21   Gilda Crease, MD  isosorbide mononitrate (IMDUR) 60 MG 24 hr tablet Take 1 tablet (60 mg total) by mouth daily. 05/16/21   Georgeanna Lea, MD  naproxen (NAPROSYN) 375 MG tablet Take 1 tablet (375 mg total) by mouth 2 (two) times daily. 05/03/21   Linwood Dibbles, MD  sacubitril-valsartan (ENTRESTO) 97-103 MG Take 1 tablet by mouth 2 (two) times daily. 05/06/21   Georgeanna Lea, MD  spironolactone (ALDACTONE) 25 MG tablet Take 1 tablet (25 mg total) by mouth daily. 01/16/21   Georgeanna Lea, MD      Allergies    Shellfish allergy and Peanut-containing drug products    Review of Systems   Review of Systems  Constitutional:  Positive for chills. Negative for fever.  HENT:  Negative for  sinus pressure and sore throat.   Eyes:  Negative for visual disturbance.  Respiratory:  Positive for shortness of breath. Negative for cough.   Cardiovascular:  Positive for leg swelling. Negative for chest pain.  Gastrointestinal:  Negative for abdominal pain, diarrhea, nausea and vomiting.  Neurological:  Negative for headaches.  All other systems reviewed and are negative.   Physical Exam Updated Vital Signs BP (!) 181/114   Pulse 98   Temp 98.5 F (36.9 C) (Oral)   Resp (!) 30   Ht 6' (1.829 m)   Wt 104.3 kg   SpO2 99%   BMI 31.19  kg/m  Physical Exam Vitals and nursing note reviewed.  Constitutional:      General: He is not in acute distress.    Appearance: Normal appearance. He is not ill-appearing, toxic-appearing or diaphoretic.  HENT:     Head: Normocephalic and atraumatic.     Nose: Nose normal. No congestion.     Mouth/Throat:     Mouth: Mucous membranes are moist.     Pharynx: Oropharynx is clear. No oropharyngeal exudate.  Eyes:     Extraocular Movements: Extraocular movements intact.     Conjunctiva/sclera: Conjunctivae normal.     Pupils: Pupils are equal, round, and reactive to light.  Cardiovascular:     Rate and Rhythm: Normal rate and regular rhythm.  Pulmonary:     Effort: Pulmonary effort is normal.     Breath sounds: No stridor. No wheezing, rhonchi or rales.  Abdominal:     General: Abdomen is flat. Bowel sounds are normal.     Palpations: Abdomen is soft.     Tenderness: There is no abdominal tenderness. There is no guarding or rebound.  Musculoskeletal:     Cervical back: Normal range of motion and neck supple. No tenderness.  Skin:    General: Skin is warm and dry.     Capillary Refill: Capillary refill takes less than 2 seconds.  Neurological:     Mental Status: He is alert and oriented to person, place, and time.     GCS: GCS eye subscore is 4. GCS verbal subscore is 5. GCS motor subscore is 6.     Cranial Nerves: Cranial nerves 2-12 are intact. No cranial nerve deficit.     Sensory: Sensation is intact. No sensory deficit.     Motor: Motor function is intact. No weakness.     Coordination: Coordination is intact. Heel to [facility] Test normal.     ED Results / Procedures / Treatments   Labs (all labs ordered are listed, but only abnormal results are displayed) Labs Reviewed  CBC WITH DIFFERENTIAL/PLATELET - Abnormal; Notable for the following components:      Result Value   Hemoglobin 12.7 (*)    All other components within normal limits  BASIC METABOLIC PANEL - Abnormal;  Notable for the following components:   Glucose, Bld 103 (*)    All other components within normal limits  BRAIN NATRIURETIC PEPTIDE - Abnormal; Notable for the following components:   B Natriuretic Peptide 803.3 (*)    All other components within normal limits  TROPONIN I (HIGH SENSITIVITY) - Abnormal; Notable for the following components:   Troponin I (High Sensitivity) 42 (*)    All other components within normal limits  TROPONIN I (HIGH SENSITIVITY) - Abnormal; Notable for the following components:   Troponin I (High Sensitivity) 30 (*)    All other components within normal limits  SARS CORONAVIRUS 2 BY RT  PCR    EKG EKG Interpretation  Date/Time:  Tuesday August 19 2021 15:45:03 EDT Ventricular Rate:  96 PR Interval:  176 QRS Duration: 100 QT Interval:  388 QTC Calculation: 490 R Axis:   227 Text Interpretation: Normal sinus rhythm Possible Left atrial enlargement Right superior axis deviation Incomplete right bundle branch block Minimal voltage criteria for LVH, may be normal variant ( Cornell product ) Possible Anterior infarct , age undetermined Abnormal ECG When compared with ECG of 03-May-2021 15:33, PREVIOUS ECG IS PRESENT Confirmed by Virgina Norfolk [phone]) on 08/19/2021 3:49:00 PM  Radiology DG Chest 2 View  Result Date: 08/19/2021 CLINICAL DATA:  Shortness of breath, pacemaker EXAM: CHEST - 2 VIEW COMPARISON:  12/10/2020 FINDINGS: Cardiomegaly. Left chest single lead pacer defibrillator. Both lungs are clear. The visualized skeletal structures are unremarkable. IMPRESSION: Cardiomegaly without acute abnormality of the lungs. Electronically Signed   By: Jearld Lesch M.D.   On: 08/19/2021 15:57    Procedures Procedures   Medications Ordered in ED Medications  furosemide (LASIX) tablet 40 mg (40 mg Oral Given 08/19/21 1808)    ED Course/ Medical Decision Making/ A&P                           Medical Decision Making Amount and/or Complexity of Data Reviewed Labs:  ordered. Radiology: ordered.  Risk Prescription drug management.   51-year male presents to the ED for evaluation.  Please see HPI for further details.  On examination, the patient is afebrile and nontachycardic.  The patient lung sounds are clear bilaterally, there is no rhonchi, rales or wheezes.  The patient is nonhypoxic on room air, the patient does not desaturate when he ambulates, the patient does not desaturate when he lies flat on his back.  The patient's abdomen is soft and compressible, no signs of distention.  The patient neurological examination shows no focal neurodeficits.  The patient has very minimal edema to his bilateral lower extremities.  Patient worked up utilizing the following labs and imaging studies interpreted by me personally: - BMP shows elevated glucose to 103 however noncontributory patient symptoms - CBC shows slightly decreased hemoglobin at 12.7 which is consistent with the patient baseline - BNP elevated 803 - Troponin elevated at 42, will collect delta troponin - Coronavirus negative - Chest x-ray shows cardiomegaly without pulmonary edema, pulmonary vascular congestion - Third troponin resulted at 30 which is decreased from original of 42  Patient provided with 40 mg Lasix. We will collect second troponin on this patient out of abundance of caution.   Patient delta troponin decreased.  Patient states that his shortness of breath has decreased as well.  The patient will be discharged at this time after discussing the patient with my attending Dr. Lockie Mola.  Patient will be advised to take 40 mg of Lasix twice daily for the next 2 days and follow-up with his cardiologist.  The patient was given return precautions and he voiced understanding.  The patient had all of his questions answered to his satisfaction.  The patient is stable at this time for discharge home.   Final Clinical Impression(s) / ED Diagnoses Final diagnoses:  Shortness of breath     Rx / DC Orders ED Discharge Orders     None         Al Decant, PA-C 08/19/21 1930    Virgina Norfolk, DO 08/19/21 2121

## 2021-08-19 NOTE — Discharge Instructions (Addendum)
Return to the ED with any new or returning symptoms such as shortness of breath, chest pain Please take 40 mg of Lasix twice daily for the next 2 days.  After 2 days, you can return to taking 40 mg of Lasix once daily.  I believe that your shortness of breath was exacerbated by not taking her Lasix consistently. Please follow-up with your cardiologist for further management

## 2021-08-19 NOTE — ED Notes (Signed)
Pt states he got sob this am states has had CHF before but does not take his Lasix as he should , he only takes it on his days off not when he works

## 2021-08-20 ENCOUNTER — Telehealth: Payer: Self-pay | Admitting: Cardiology

## 2021-08-20 NOTE — Telephone Encounter (Signed)
Letter sent to pt for work

## 2021-08-20 NOTE — Telephone Encounter (Signed)
Pt c/o medication issue:  1. Name of Medication:  furosemide (LASIX) 40 MG tablet  2. How are you currently taking this medication (dosage and times per day)?   3. Are you having a reaction (difficulty breathing--STAT)?   4. What is your medication issue?   Patient is requesting to speak with Dr. Wendy Poet nurse. Yesterday he was admitted to the ED and they are wanting him to increase Lasix 40 MG from once daily to twice daily for the next few days. However, patient states the increase will be difficult due to him having to work. He assumes this will cause frequent urination. Please advise.

## 2021-08-25 ENCOUNTER — Emergency Department (HOSPITAL_BASED_OUTPATIENT_CLINIC_OR_DEPARTMENT_OTHER): Payer: Medicaid Other

## 2021-08-25 ENCOUNTER — Observation Stay (HOSPITAL_BASED_OUTPATIENT_CLINIC_OR_DEPARTMENT_OTHER)
Admission: EM | Admit: 2021-08-25 | Discharge: 2021-08-28 | Disposition: A | Discharge status: Home / Self Care | Payer: Medicaid Other | Attending: Family Medicine | Admitting: Family Medicine

## 2021-08-25 ENCOUNTER — Other Ambulatory Visit: Payer: Self-pay | Admitting: Cardiology

## 2021-08-25 ENCOUNTER — Other Ambulatory Visit: Payer: Self-pay

## 2021-08-25 ENCOUNTER — Encounter (HOSPITAL_BASED_OUTPATIENT_CLINIC_OR_DEPARTMENT_OTHER): Payer: Self-pay | Admitting: Urology

## 2021-08-25 ENCOUNTER — Ambulatory Visit (INDEPENDENT_AMBULATORY_CARE_PROVIDER_SITE_OTHER): Payer: Medicaid Other

## 2021-08-25 DIAGNOSIS — Z91010 Allergy to peanuts: Secondary | ICD-10-CM | POA: Diagnosis not present

## 2021-08-25 DIAGNOSIS — I110 Hypertensive heart disease with heart failure: Secondary | ICD-10-CM | POA: Diagnosis not present

## 2021-08-25 DIAGNOSIS — I959 Hypotension, unspecified: Secondary | ICD-10-CM | POA: Insufficient documentation

## 2021-08-25 DIAGNOSIS — I5020 Unspecified systolic (congestive) heart failure: Secondary | ICD-10-CM | POA: Insufficient documentation

## 2021-08-25 DIAGNOSIS — I159 Secondary hypertension, unspecified: Secondary | ICD-10-CM

## 2021-08-25 DIAGNOSIS — I420 Dilated cardiomyopathy: Secondary | ICD-10-CM

## 2021-08-25 DIAGNOSIS — I509 Heart failure, unspecified: Secondary | ICD-10-CM

## 2021-08-25 DIAGNOSIS — I5042 Chronic combined systolic (congestive) and diastolic (congestive) heart failure: Secondary | ICD-10-CM | POA: Diagnosis not present

## 2021-08-25 DIAGNOSIS — Z95810 Presence of automatic (implantable) cardiac defibrillator: Secondary | ICD-10-CM | POA: Diagnosis not present

## 2021-08-25 DIAGNOSIS — R55 Syncope and collapse: Principal | ICD-10-CM | POA: Insufficient documentation

## 2021-08-25 DIAGNOSIS — Z79899 Other long term (current) drug therapy: Secondary | ICD-10-CM | POA: Diagnosis not present

## 2021-08-25 DIAGNOSIS — R531 Weakness: Secondary | ICD-10-CM | POA: Diagnosis present

## 2021-08-25 DIAGNOSIS — I10 Essential (primary) hypertension: Secondary | ICD-10-CM | POA: Diagnosis present

## 2021-08-25 LAB — CBC WITH DIFFERENTIAL/PLATELET
Abs Immature Granulocytes: 0.01 10*3/uL (ref 0.00–0.07)
Basophils Absolute: 0.1 10*3/uL (ref 0.0–0.1)
Basophils Relative: 1 %
Eosinophils Absolute: 0.6 10*3/uL — ABNORMAL HIGH (ref 0.0–0.5)
Eosinophils Relative: 8 %
HCT: 42.4 % (ref 39.0–52.0)
Hemoglobin: 13.9 g/dL (ref 13.0–17.0)
Immature Granulocytes: 0 %
Lymphocytes Relative: 38 %
Lymphs Abs: 2.6 10*3/uL (ref 0.7–4.0)
MCH: 26.9 pg (ref 26.0–34.0)
MCHC: 32.8 g/dL (ref 30.0–36.0)
MCV: 82 fL (ref 80.0–100.0)
Monocytes Absolute: 0.5 10*3/uL (ref 0.1–1.0)
Monocytes Relative: 7 %
Neutro Abs: 3.3 10*3/uL (ref 1.7–7.7)
Neutrophils Relative %: 46 %
Platelets: 303 10*3/uL (ref 150–400)
RBC: 5.17 MIL/uL (ref 4.22–5.81)
RDW: 14.9 % (ref 11.5–15.5)
WBC: 7 10*3/uL (ref 4.0–10.5)
nRBC: 0 % (ref 0.0–0.2)

## 2021-08-25 LAB — BASIC METABOLIC PANEL
Anion gap: 8 (ref 5–15)
BUN: 18 mg/dL (ref 6–20)
CO2: 24 mmol/L (ref 22–32)
Calcium: 8.8 mg/dL — ABNORMAL LOW (ref 8.9–10.3)
Chloride: 104 mmol/L (ref 98–111)
Creatinine, Ser: 1.14 mg/dL (ref 0.61–1.24)
GFR, Estimated: >60 mL/min (ref >60)
Glucose, Bld: 147 mg/dL — ABNORMAL HIGH (ref 70–99)
Potassium: 3.8 mmol/L (ref 3.5–5.1)
Sodium: 136 mmol/L (ref 135–145)

## 2021-08-25 LAB — BRAIN NATRIURETIC PEPTIDE: B Natriuretic Peptide: 393.7 pg/mL — ABNORMAL HIGH (ref 0.0–100.0)

## 2021-08-25 LAB — LIPASE, BLOOD: Lipase: 68 U/L — ABNORMAL HIGH (ref 11–51)

## 2021-08-25 LAB — D-DIMER, QUANTITATIVE: D-Dimer, Quant: 1.01 ug/mL-FEU — ABNORMAL HIGH (ref 0.00–0.50)

## 2021-08-25 LAB — TROPONIN I (HIGH SENSITIVITY)
Troponin I (High Sensitivity): 20 ng/L — ABNORMAL HIGH (ref <18)
Troponin I (High Sensitivity): 24 ng/L — ABNORMAL HIGH (ref <18)

## 2021-08-25 MED ORDER — SODIUM CHLORIDE 0.9 % IV BOLUS
500.0000 mL | Freq: Once | INTRAVENOUS | Status: AC
  Administered 2021-08-25: 500 mL via INTRAVENOUS

## 2021-08-25 MED ORDER — IOHEXOL 350 MG/ML SOLN
100.0000 mL | Freq: Once | INTRAVENOUS | Status: AC | PRN
  Administered 2021-08-25: 100 mL via INTRAVENOUS

## 2021-08-25 NOTE — ED Notes (Signed)
Pt hypotensive in triage, Dr Renaye Rakers notified

## 2021-08-25 NOTE — ED Provider Notes (Signed)
[facility] EMERGENCY DEPARTMENT Provider Note   CSN: [id number] Arrival date & time: 08/25/21  1811     History {Add pertinent medical, surgical, social history, OB history to HPI:1} Chief Complaint  Patient presents with   Extremity Heaviness   Hypotension    Jeffery Glenn is a 51 y.o. male with a history of nonischemic cardiomyopathy presenting to ED with generalized weakness and hypotension.  Patient reports that on his way to work today began to feel that his arms and legs were heavy, and he felt very hungry, and tried to eat some food.  He stopped at a [employer] because he was continue to have leg heaviness, went to use the bathroom, became nauseated and vomited.  He said he fell like he was going to pass out.  Came to the ER instead.  He reports that he was in the ED last week for shortness of breath, felt to be related to congestive heart failure, and they increased his torsemide dosing at home, which he has been compliant with.  He is not on blood thinners.  He denies any chest pain or pressure.  He currently feels mildly lightheaded.  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  He is felt to have nonischemic cardiomyopathy.  HPI     Home Medications Prior to Admission medications   Medication Sig Start Date End Date Taking? Authorizing Provider  amLODipine (NORVASC) 10 MG tablet Take 1 tablet (10 mg total) by mouth daily. 05/06/21   Georgeanna Lea, MD  atorvastatin (LIPITOR) 20 MG tablet Take 1 tablet (20 mg total) by mouth daily. 03/05/20   Georgeanna Lea, MD  carvedilol (COREG) 25 MG tablet Take 1 tablet (25 mg total) by mouth 2 (two) times daily with a meal. 05/06/21   Georgeanna Lea, MD  clindamycin (CLEOCIN) 150 MG capsule Take 2 capsules (300 mg total) by mouth 4 (four) times daily. 06/07/21   Gilda Crease, MD   doxycycline (VIBRAMYCIN) 100 MG capsule Take 1 capsule (100 mg total) by mouth 2 (two) times daily. 08/01/21   Tilden Fossa, MD  furosemide (LASIX) 40 MG tablet Take 1 tablet (40 mg total) by mouth daily. 05/06/21   Georgeanna Lea, MD  hydrALAZINE (APRESOLINE) 100 MG tablet Take 0.5 tablets (50 mg total) by mouth 3 (three) times daily. 03/31/21   Camnitz, Andree Coss, MD  HYDROcodone-acetaminophen (NORCO/VICODIN) 5-325 MG tablet Take 1 tablet by mouth every 4 (four) hours as needed for moderate pain. 06/07/21   Gilda Crease, MD  isosorbide mononitrate (IMDUR) 60 MG 24 hr tablet Take 1 tablet (60 mg total) by mouth daily. 05/16/21   Georgeanna Lea, MD  naproxen (NAPROSYN) 375 MG tablet Take 1 tablet (375 mg total) by mouth 2 (two) times daily. 05/03/21   Linwood Dibbles, MD  sacubitril-valsartan (ENTRESTO) 97-103 MG Take 1 tablet by mouth 2 (two) times daily. 05/06/21   Georgeanna Lea, MD  spironolactone (ALDACTONE) 25 MG tablet Take 1 tablet (25 mg total) by mouth daily. 01/16/21   Georgeanna Lea, MD      Allergies    Shellfish allergy and Peanut-containing drug products    Review of Systems   Review of Systems  Physical Exam Updated Vital Signs BP 102/62   Pulse 63   Temp 97.7 F (36.5 C) (Oral)  Resp (!) 25   Ht 6' (1.829 m)   Wt 104.8 kg   SpO2 95%   BMI 31.33 kg/m  Physical Exam Constitutional:      General: He is not in acute distress. HENT:     Head: Normocephalic and atraumatic.  Eyes:     Conjunctiva/sclera: Conjunctivae normal.     Pupils: Pupils are equal, round, and reactive to light.  Cardiovascular:     Rate and Rhythm: Normal rate and regular rhythm.  Pulmonary:     Effort: Pulmonary effort is normal. No respiratory distress.  Abdominal:     General: There is no distension.     Tenderness: There is no abdominal tenderness.  Skin:    General: Skin is warm and dry.  Neurological:     General: No focal deficit present.     Mental  Status: He is alert. Mental status is at baseline.  Psychiatric:        Mood and Affect: Mood normal.        Behavior: Behavior normal.     ED Results / Procedures / Treatments   Labs (all labs ordered are listed, but only abnormal results are displayed) Labs Reviewed  BASIC METABOLIC PANEL  CBC WITH DIFFERENTIAL/PLATELET  BRAIN NATRIURETIC PEPTIDE  LIPASE, BLOOD  TROPONIN I (HIGH SENSITIVITY)    EKG None  Radiology No results found.  Procedures Procedures  {Document cardiac monitor, telemetry assessment procedure when appropriate:1}  Medications Ordered in ED Medications - No data to display  ED Course/ Medical Decision Making/ A&P                           Medical Decision Making Amount and/or Complexity of Data Reviewed Labs: ordered. Radiology: ordered.   This patient presents to the ED with concern for hypotension, near syncope. This involves an extensive number of treatment options, and is a complaint that carries with it a high risk of complications and morbidity.  The differential diagnosis includes overdiuresis from diuretics versus orthostatic hypotension versus arrhythmia versus pulmonary embolism versus other  Lower suspicion with ACS with a coronary calcium score of 0 on CT imaging of the coronaries  2 years ago.  Co-morbidities that complicate the patient evaluation: History of nonischemic cardiomyopathy  Additional history obtained from patient's family member at bedside  External records from outside source obtained and reviewed including coronary CT scan as noted above  I ordered and personally interpreted labs.  The pertinent results include:  ***  I ordered imaging studies including x-ray of the chest I independently visualized and interpreted imaging which showed *** I agree with the radiologist interpretation  The patient was maintained on a cardiac monitor.  I personally viewed and interpreted the cardiac monitored which showed an  underlying rhythm of: Normal sinus rhythm  Per my interpretation the patient's ECG shows left ventricular hypertrophy as seen on prior imaging, no other significant acute changes noted  I ordered medication including ***  for *** I have reviewed the patients home medicines and have made adjustments as needed  Test Considered: ***  I requested consultation with the ***,  and discussed lab and imaging findings as well as pertinent plan - they recommend: ***  After the interventions noted above, I reevaluated the patient and found that they have: {resolved/improved/worsened:23923::"improved"}  Social Determinants of Health:***  Dispostion:  After consideration of the diagnostic results and the patients response to treatment, I feel that the patent would benefit  from ***.   {Document critical care time when appropriate:1} {Document review of labs and clinical decision tools ie heart score, Chads2Vasc2 etc:1}  {Document your independent review of radiology images, and any outside records:1} {Document your discussion with family members, caretakers, and with consultants:1} {Document social determinants of health affecting pt's care:1} {Document your decision making why or why not admission, treatments were needed:1} Final Clinical Impression(s) / ED Diagnoses Final diagnoses:  None    Rx / DC Orders ED Discharge Orders     None

## 2021-08-25 NOTE — ED Notes (Signed)
Pt ambulatory to restroom with steady gait.

## 2021-08-25 NOTE — ED Notes (Signed)
X-ray at bedside

## 2021-08-25 NOTE — ED Notes (Signed)
Spoke with Selena Batten at [facility] regarding hospitalist consult for admission

## 2021-08-25 NOTE — ED Triage Notes (Signed)
Pt states at approx 1630 arms and legs got heavy and states he got drowsy Reports nausea and vomiting  Denies fever

## 2021-08-26 ENCOUNTER — Encounter (HOSPITAL_COMMUNITY): Payer: Self-pay | Admitting: Internal Medicine

## 2021-08-26 DIAGNOSIS — R55 Syncope and collapse: Secondary | ICD-10-CM

## 2021-08-26 DIAGNOSIS — I5042 Chronic combined systolic (congestive) and diastolic (congestive) heart failure: Secondary | ICD-10-CM | POA: Diagnosis not present

## 2021-08-26 DIAGNOSIS — I959 Hypotension, unspecified: Secondary | ICD-10-CM | POA: Diagnosis not present

## 2021-08-26 DIAGNOSIS — I10 Essential (primary) hypertension: Secondary | ICD-10-CM | POA: Diagnosis not present

## 2021-08-26 LAB — CBC
HCT: 41.9 % (ref 39.0–52.0)
Hemoglobin: 13.9 g/dL (ref 13.0–17.0)
MCH: 27.2 pg (ref 26.0–34.0)
MCHC: 33.2 g/dL (ref 30.0–36.0)
MCV: 82 fL (ref 80.0–100.0)
Platelets: 322 10*3/uL (ref 150–400)
RBC: 5.11 MIL/uL (ref 4.22–5.81)
RDW: 14.7 % (ref 11.5–15.5)
WBC: 6.6 10*3/uL (ref 4.0–10.5)
nRBC: 0 % (ref 0.0–0.2)

## 2021-08-26 LAB — BASIC METABOLIC PANEL
Anion gap: 11 (ref 5–15)
BUN: 17 mg/dL (ref 6–20)
CO2: 24 mmol/L (ref 22–32)
Calcium: 9.2 mg/dL (ref 8.9–10.3)
Chloride: 104 mmol/L (ref 98–111)
Creatinine, Ser: 1.03 mg/dL (ref 0.61–1.24)
GFR, Estimated: >60 mL/min (ref >60)
Glucose, Bld: 100 mg/dL — ABNORMAL HIGH (ref 70–99)
Potassium: 4.4 mmol/L (ref 3.5–5.1)
Sodium: 139 mmol/L (ref 135–145)

## 2021-08-26 LAB — MAGNESIUM: Magnesium: 2.1 mg/dL (ref 1.7–2.4)

## 2021-08-26 MED ORDER — ACETAMINOPHEN 650 MG RE SUPP: 650.0000 mg | Freq: Four times a day (QID) | RECTAL | Status: DC | PRN

## 2021-08-26 MED ORDER — ACETAMINOPHEN 325 MG PO TABS
650.0000 mg | ORAL_TABLET | Freq: Once | ORAL | Status: AC
  Administered 2021-08-26: 650 mg via ORAL
  Filled 2021-08-26: qty 2

## 2021-08-26 MED ORDER — POLYETHYLENE GLYCOL 3350 17 G PO PACK: 17.0000 g | PACK | Freq: Every day | ORAL | Status: DC | PRN

## 2021-08-26 MED ORDER — ENOXAPARIN SODIUM 40 MG/0.4ML IJ SOSY
40.0000 mg | PREFILLED_SYRINGE | INTRAMUSCULAR | Status: DC
  Administered 2021-08-26 – 2021-08-27 (×2): 40 mg via SUBCUTANEOUS
  Filled 2021-08-26: qty 0.4

## 2021-08-26 MED ORDER — ACETAMINOPHEN 325 MG PO TABS: 650.0000 mg | ORAL_TABLET | Freq: Four times a day (QID) | ORAL | Status: DC | PRN

## 2021-08-26 MED ORDER — SODIUM CHLORIDE 0.9% FLUSH
3.0000 mL | Freq: Two times a day (BID) | INTRAVENOUS | Status: DC
  Administered 2021-08-26 – 2021-08-28 (×4): 3 mL via INTRAVENOUS

## 2021-08-26 MED ORDER — ATORVASTATIN CALCIUM 10 MG PO TABS
20.0000 mg | ORAL_TABLET | Freq: Every day | ORAL | Status: DC
  Administered 2021-08-27 – 2021-08-28 (×2): 20 mg via ORAL
  Filled 2021-08-26 (×2): qty 2

## 2021-08-26 MED ORDER — ISOSORBIDE MONONITRATE ER 60 MG PO TB24: 60.0000 mg | ORAL_TABLET | Freq: Every day | ORAL | Status: DC

## 2021-08-26 NOTE — H&P (Signed)
History and Physical   Jeffery Glenn BOF:[id number] DOB: May 04, 1970 DOA: 08/25/2021  PCP: [facility]   Patient coming from: Home  Chief Complaint: Weakness, lightheadedness  HPI: Jeffery Glenn is a 51 y.o. male with medical history significant of CHF, hypertension presenting with weakness and lightheadedness.  Patient reports that on the day he presented to the ED he felt weak and like his limbs were heavy while he was on his way to work.  He also felt hungry and try to eat something.  Because he is feeling well he stopped off at a fast food restaurant to use the bathroom, he began to feel lightheaded, like he might pass out, after going to the bathroom and went to call his wife.  After this he began to feel nausea and went to the bathroom and threw up.  He went to the ED for further evaluation.  Found to have initial hypotension as below.  He reports a week ago he was in the ED for shortness of breath thought to be due to his CHF and he had his dose of diuretic increased at that time.  He denies fevers, chills, chest pain, shortness of breath, abdominal pain, constipation, diarrhea.  ED Course: Vital signs in the ED significant for initial blood pressure of 78/40 with improvement to the 100s systolic without any interventions.  Since then its been in the 100s to 130s systolic.  Lab work-up included BMP with glucose 147, calcium 8.8.  CBC within normal limits.  Lipase mildly elevated at 68.  D-dimer mildly elevated at 1.01.  Troponin flat at 20 and then 24 on repeat.  BNP mildly elevated at 393.  Chest x-ray without acute normality.  CTA PE study was negative for PE and showed no acute abnormality.  Patient received Tylenol and a liter of fluids in the ED.  Review of Systems: As per HPI otherwise all other systems reviewed and are negative.  Past Medical History:  Diagnosis Date   Atypical chest pain 05/17/2018   Atypical chest pain 05/17/2018   Cardiomyopathy (HCC)  ejection fraction 25 to 30% in February 2020, improvement to 30 to 35% in January 2021 06/09/2018   Ejection fraction 25 to 30% on echocardiogram from February 2020   Cardiomyopathy [facility]) ejection fraction 25 to 30% in February 2020, improvement to 30 to 35% in January 2021 06/09/2018   Ejection fraction 25 to 30% on echocardiogram from February 2020   Congestive heart failure (CHF) (HCC) 05/06/2018   Dyspnea on exertion 05/17/2018   Essential hypertension 05/17/2018   Hypertension     Past Surgical History:  Procedure Laterality Date   HAND SURGERY     NASAL SEPTUM SURGERY     SUBQ ICD IMPLANT N/A 11/08/2020   Procedure: SUBQ ICD IMPLANT;  Surgeon: Regan Lemming, MD;  Location: [facility] INVASIVE CV LAB;  Service: Cardiovascular;  Laterality: N/A;    Social History  reports that he has never smoked. He has never used smokeless tobacco. He reports that he does not currently use alcohol. He reports that he does not use drugs.  Allergies  Allergen Reactions   Shellfish Allergy Other (See Comments)    hives   Peanut-Containing Drug Products Other (See Comments)    Allergy test/ Patient eats peanuts    Family History  Problem Relation Age of Onset   Heart attack Mother    Hypertension Mother    Heart attack Father    Hypertension Father   Reviewed on  admission  Prior to Admission medications   Medication Sig Start Date End Date Taking? Authorizing Provider  amLODipine (NORVASC) 10 MG tablet Take 1 tablet (10 mg total) by mouth daily. 05/06/21   Park Liter, MD  atorvastatin (LIPITOR) 20 MG tablet Take 1 tablet (20 mg total) by mouth daily. 03/05/20   Park Liter, MD  carvedilol (COREG) 25 MG tablet Take 1 tablet (25 mg total) by mouth 2 (two) times daily with a meal. 05/06/21   Park Liter, MD  clindamycin (CLEOCIN) 150 MG capsule Take 2 capsules (300 mg total) by mouth 4 (four) times daily. 06/07/21   Orpah Greek, MD  doxycycline (VIBRAMYCIN) 100 MG  capsule Take 1 capsule (100 mg total) by mouth 2 (two) times daily. 08/01/21   Quintella Reichert, MD  furosemide (LASIX) 40 MG tablet Take 1 tablet (40 mg total) by mouth daily. 05/06/21   Park Liter, MD  hydrALAZINE (APRESOLINE) 100 MG tablet Take 0.5 tablets (50 mg total) by mouth 3 (three) times daily. 03/31/21   Camnitz, Ocie Doyne, MD  HYDROcodone-acetaminophen (NORCO/VICODIN) 5-325 MG tablet Take 1 tablet by mouth every 4 (four) hours as needed for moderate pain. 06/07/21   Orpah Greek, MD  isosorbide mononitrate (IMDUR) 60 MG 24 hr tablet Take 1 tablet (60 mg total) by mouth daily. 05/16/21   Park Liter, MD  naproxen (NAPROSYN) 375 MG tablet Take 1 tablet (375 mg total) by mouth 2 (two) times daily. 05/03/21   Dorie Rank, MD  sacubitril-valsartan (ENTRESTO) 97-103 MG Take 1 tablet by mouth 2 (two) times daily. 05/06/21   Park Liter, MD  spironolactone (ALDACTONE) 25 MG tablet Take 1 tablet (25 mg total) by mouth daily. 01/16/21   Park Liter, MD    Physical Exam: Vitals:   08/26/21 1322 08/26/21 1600 08/26/21 1630 08/26/21 1750  BP: 132/76 106/71 106/71 (!) 135/99  Pulse: 70 69 71 73  Resp: 16 (!) 22 18 17   Temp: 98 F (36.7 C) 97.6 F (36.4 C) 97.6 F (36.4 C) 98 F (36.7 C)  TempSrc: Oral   Oral  SpO2: 99% 98% 95% 94%  Weight:      Height:        Physical Exam Constitutional:      General: He is not in acute distress.    Appearance: Normal appearance.  HENT:     Head: Normocephalic and atraumatic.     Mouth/Throat:     Mouth: Mucous membranes are moist.     Pharynx: Oropharynx is clear.  Eyes:     Extraocular Movements: Extraocular movements intact.     Pupils: Pupils are equal, round, and reactive to light.  Cardiovascular:     Rate and Rhythm: Normal rate and regular rhythm.     Pulses: Normal pulses.     Heart sounds: Normal heart sounds.  Pulmonary:     Effort: Pulmonary effort is normal. No respiratory distress.      Breath sounds: Normal breath sounds.  Abdominal:     General: Bowel sounds are normal. There is no distension.     Palpations: Abdomen is soft.     Tenderness: There is no abdominal tenderness.  Musculoskeletal:        General: No swelling or deformity.  Skin:    General: Skin is warm and dry.  Neurological:     General: No focal deficit present.     Mental Status: Mental status is at baseline.  Labs on Admission: I have personally reviewed following labs and imaging studies  CBC: Recent Labs  Lab 08/25/21 1833 08/26/21 1836  WBC 7.0 6.6  NEUTROABS 3.3  --   HGB 13.9 13.9  HCT 42.4 41.9  MCV 82.0 82.0  PLT 303 [id number]    Basic Metabolic Panel: Recent Labs  Lab 08/25/21 1833  NA 136  K 3.8  CL 104  CO2 24  GLUCOSE 147*  BUN 18  CREATININE 1.14  CALCIUM 8.8*    GFR: Estimated Creatinine Clearance: 98.1 mL/min (by C-G formula based on SCr of 1.14 mg/dL).  Liver Function Tests: No results for input(s): "AST", "ALT", "ALKPHOS", "BILITOT", "PROT", "ALBUMIN" in the last 168 hours.  Urine analysis:    Component Value Date/Time   COLORURINE YELLOW 08/01/2021 [place] 08/01/2021 0531   LABSPEC 1.025 08/01/2021 0531   PHURINE 5.5 08/01/2021 [place] 08/01/2021 0531   HGBUR NEGATIVE 08/01/2021 0531   BILIRUBINUR NEGATIVE 08/01/2021 0531   KETONESUR NEGATIVE 08/01/2021 0531   PROTEINUR NEGATIVE 08/01/2021 0531   NITRITE NEGATIVE 08/01/2021 0531   LEUKOCYTESUR NEGATIVE 08/01/2021 0531    Radiological Exams on Admission: CT Angio Chest PE W and/or Wo Contrast  Result Date: 08/25/2021 CLINICAL DATA:  Short of breath concern for pulmonary embolism EXAM: CT ANGIOGRAPHY CHEST WITH CONTRAST TECHNIQUE: Multidetector CT imaging of the chest was performed using the standard protocol during bolus administration of intravenous contrast. Multiplanar CT image reconstructions and MIPs were obtained to evaluate the vascular anatomy. RADIATION DOSE  REDUCTION: This exam was performed according to the departmental dose-optimization program which includes automated exposure control, adjustment of the mA and/or kV according to patient size and/or use of iterative reconstruction technique. CONTRAST:  171mL OMNIPAQUE IOHEXOL 350 MG/ML SOLN COMPARISON:  None Available. FINDINGS: Cardiovascular: No filling defects within the pulmonary arteries to suggest acute pulmonary embolism. Mediastinum/Nodes: No axillary or supraclavicular adenopathy. No mediastinal or hilar adenopathy. No pericardial fluid. Esophagus normal. Is Lungs/Pleura: No pulmonary infarction. No airspace disease. Airways normal. Upper Abdomen: Limited view of the liver, kidneys, pancreas are unremarkable. Normal adrenal glands. Musculoskeletal: Generator pack in the LEFT chest wall. Lead extends along the sternum superficially. Review of the MIP images confirms the above findings. IMPRESSION: 1. No evidence of acute pulmonary embolism. 2. No acute pulmonary parenchymal findings. Electronically Signed   By: Suzy Bouchard M.D.   On: 08/25/2021 20:11   DG Chest 2 View  Result Date: 08/25/2021 CLINICAL DATA:  Near syncope EXAM: CHEST - 2 VIEW COMPARISON:  Chest x-ray 08/19/2021 FINDINGS: Heart is mildly enlarged. Mediastinum appears stable and within normal limits. Left-sided cardiac device stable. Pulmonary vasculature is normal. No focal consolidation, pleural effusion or pneumothorax identified. IMPRESSION: No acute process identified. Electronically Signed   By: Ofilia Neas M.D.   On: 08/25/2021 19:07    EKG: Independently reviewed.  Sinus rhythm at 61 bpm.  Nonspecific intraventricular conduction delay.  LVH.  T wave inversions in V4 through V6.  Similar to previous with T wave versions appearing to be new.  Assessment/Plan Principal Problem:   Hypotension Active Problems:   Congestive heart failure (CHF) (HCC)   Essential hypertension   Near syncope   Near  syncope Hypotension > As per HPI had weakness and lightheadedness with some nausea on the day of presentation and was found to have hypotension when initially presented in the Q000111Q systolic which improved without significant intervention to the 100s now in the [id number] to Q000111Q systolic. >  Is on multiple blood pressure medications as below which are being held.  Had his diuretic dose increased a week ago which quickly oral. > Negative orthostatics in the ED.  No significant electrolyte abnormalities.  Will check magnesium. > Although he does have mild troponin elevation is largely flat at 20 and 24 on repeat and he has had a recent CT coronary study and 2021 which showed no evidence of CAD. > Unclear exact etiology possible that overdiuresis and/or vasovagal syndrome could be playing a role (given symptoms occurring after using the bathroom). > Received 500 cc in the ED.  On [facility] to [facility] blood pressure now in the 130s systolic. - Monitor on telemetry - Repeat echocardiogram - Hold antihypertensives until patient's blood pressure improves as below, will slowly begin to add back starting with carvedilol and Imdur, likely will build to add amlodipine back tomorrow. - Check magnesium  CHF Hypertension > Last echo around a year ago with EF 20-25% grade 1 diastolic dysfunction and mildly reduced RV function. > Did have diuretic increased during recent ED evaluation there is concern for worsening CHF leading to shortness of breath.  At that time his Lasix was increased from 40 mg daily to more 40 mg twice daily. > Episode of hypotension as above possibly related to overdiuresis.  No evidence of AKI on lab work-up. > Received 500 cc in the ED. Still without any evidence of volume overload on exam. - Continue to monitor on telemetry as above - Check magnesium as above - Trend renal function electrolytes - Repeat echocardiogram as above - Holding home Lasix, spironolactone, Entresto - Holding home  amlodipine, hydralazine - Continue home carvedilol - Hold off on Imdur because he states he never really started - Continue home atorvastatin - Continue to slowly add back antihypertensives as tolerated  DVT prophylaxis: Lovenox Code Status:   Full Family Communication:  None on admission, he states his wife is aware of admission. Disposition Plan:   Patient is from:  Home  Anticipated DC to:  Home  Anticipated DC date:  2 to 3 days  Anticipated DC barriers: None  Consults called:  None Admission status:  Inpatient, progressive  Severity of Illness: The appropriate patient status for this patient is INPATIENT. Inpatient status is judged to be reasonable and necessary in order to provide the required intensity of service to ensure the patient's safety. The patient's presenting symptoms, physical exam findings, and initial radiographic and laboratory data in the context of their chronic comorbidities is felt to place them at high risk for further clinical deterioration. Furthermore, it is not anticipated that the patient will be medically stable for discharge from the hospital within 2 midnights of admission.   * I certify that at the point of admission it is my clinical judgment that the patient will require inpatient hospital care spanning beyond 2 midnights from the point of admission due to high intensity of service, high risk for further deterioration and high frequency of surveillance required.Synetta Fail MD [facility]  How to contact the [facility] Attending or Consulting provider 7A - 7P or covering provider during after hours 7P -7A, for this patient?   Check the care team in [facility] and look for a) attending/consulting [facility] provider listed and b) the [facility] team listed Log into [email or web address] and use 's universal password to access. If you do not have the password, please contact the hospital operator. Locate the [facility] provider you are looking for under  [facility] and page to a number that you can be directly reached. If you still have difficulty reaching the provider, please page the [facility] (Director on Call) for the Hospitalists listed on amion for assistance.  08/26/2021, 7:13 PM

## 2021-08-26 NOTE — ED Notes (Signed)
[facility] at bedside. Pt stable for transport. 

## 2021-08-26 NOTE — ED Notes (Signed)
Ambulatory to bathroom. Gait steady. Neuro check WNL. GCS 15. Meal provided. Patient sitting up in chair eating, tolerating well.

## 2021-08-26 NOTE — Progress Notes (Signed)
Transferring facility: [facility] Requesting provider: Dr. Renaye Rakers (EDP at [facility]) Reason for transfer: admission for further evaluation and management of hypotension.    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  Not associate with any chest pain, shortness of breath, diaphoresis, nausea, vomiting, or recent preceding trauma.  His generalized weakness has not been associate any acute focal neurologic deficits.  He has a history of chronic combined systolic/diastolic heart failure, with most recent echocardiogram performed in July 2022, which is notable for LVEF 20 to 25%, grade 1 diastolic dysfunction.  This is reportedly felt to be in the setting of nonischemic cardiomyopathy, the patient reportedly undergoing coronary angiography approximately 2 years ago, demonstrating no evidence of obstructive CAD.  He reports recent increase in dose of torsemide as an outpatient approximately 1 week ago.   He also notes a history of hypertension, and conveys that he is on several additional antihypertensive medications at home in addition to his torsemide.  He notes that his outpatient antihypertensive medications include Norvasc, Coreg, hydralazine, Entresto, spironolactone.  In spite of outstanding compliance with these 6 antihypertensive medications, the patient's baseline blood pressure reportedly is in the range of the 160s to 180s mmHg.   Vital signs in the ED were notable for the following: Initial blood pressure noted to be 78/41.  Without interval IV fluids, blood pressure subsequently increased into the systolic values of 100 - 110 mmHg.   Labs were notable for for an elevated D-dimer, prompting CT chest which showed  no evidence of acute pulmonary embolism.   However, in the setting of ongoing generalized weakness, intermittent dizziness/lightheaded this, and relative hypotension relative to baseline systolic blood pressures in this patient who cannot be managed with aggressive IV fluids given his history of chronic combined heart failure , subsequently, I accepted this patient for transfer for inpatient admission to a pcu bed at [facility] for further work-up and management of the above.  [facility] was chosen specifically (as opposed to [employer]) in case cardiology consultation is required given his complex cardiac history that may be contributing to the above presentation.      Check [email or web address] for on-call coverage.   Nursing staff, Please call [facility] Admits & Consults System-Wide number on Amion as soon as patient's arrival, so appropriate admitting provider can evaluate the pt.     Newton Pigg, DO Hospitalist

## 2021-08-27 ENCOUNTER — Inpatient Hospital Stay (HOSPITAL_BASED_OUTPATIENT_CLINIC_OR_DEPARTMENT_OTHER): Payer: Medicaid Other

## 2021-08-27 DIAGNOSIS — R55 Syncope and collapse: Secondary | ICD-10-CM | POA: Diagnosis not present

## 2021-08-27 DIAGNOSIS — I959 Hypotension, unspecified: Secondary | ICD-10-CM | POA: Diagnosis not present

## 2021-08-27 DIAGNOSIS — I10 Essential (primary) hypertension: Secondary | ICD-10-CM | POA: Diagnosis not present

## 2021-08-27 DIAGNOSIS — I5042 Chronic combined systolic (congestive) and diastolic (congestive) heart failure: Secondary | ICD-10-CM | POA: Diagnosis not present

## 2021-08-27 DIAGNOSIS — Z95810 Presence of automatic (implantable) cardiac defibrillator: Secondary | ICD-10-CM

## 2021-08-27 LAB — GLUCOSE, CAPILLARY: Glucose-Capillary: 112 mg/dL — ABNORMAL HIGH (ref 70–99)

## 2021-08-27 LAB — ECHOCARDIOGRAM COMPLETE
AR max vel: 2.61 cm2
AV Peak grad: 5.1 mmHg
Ao pk vel: 1.13 m/s
Area-P 1/2: 4.29 cm2
Calc EF: 28.9 %
Height: 72 in
MV M vel: 4.34 m/s
MV Peak grad: 75.3 mmHg
S' Lateral: 5.9 cm
Single Plane A2C EF: 27.1 %
Single Plane A4C EF: 29.2 %
Weight: 3739 oz

## 2021-08-27 LAB — CBC
HCT: 41.8 % (ref 39.0–52.0)
Hemoglobin: 13.4 g/dL (ref 13.0–17.0)
MCH: 26.6 pg (ref 26.0–34.0)
MCHC: 32.1 g/dL (ref 30.0–36.0)
MCV: 82.9 fL (ref 80.0–100.0)
Platelets: 285 10*3/uL (ref 150–400)
RBC: 5.04 MIL/uL (ref 4.22–5.81)
RDW: 15.1 % (ref 11.5–15.5)
WBC: 6.1 10*3/uL (ref 4.0–10.5)
nRBC: 0 % (ref 0.0–0.2)

## 2021-08-27 LAB — BASIC METABOLIC PANEL
Anion gap: 6 (ref 5–15)
BUN: 19 mg/dL (ref 6–20)
CO2: 26 mmol/L (ref 22–32)
Calcium: 9.2 mg/dL (ref 8.9–10.3)
Chloride: 106 mmol/L (ref 98–111)
Creatinine, Ser: 0.86 mg/dL (ref 0.61–1.24)
GFR, Estimated: >60 mL/min (ref >60)
Glucose, Bld: 137 mg/dL — ABNORMAL HIGH (ref 70–99)
Potassium: 3.6 mmol/L (ref 3.5–5.1)
Sodium: 138 mmol/L (ref 135–145)

## 2021-08-27 MED ORDER — HYDROCODONE-ACETAMINOPHEN 5-325 MG PO TABS
1.0000 | ORAL_TABLET | Freq: Once | ORAL | Status: AC
  Administered 2021-08-27: 1 via ORAL
  Filled 2021-08-27: qty 1

## 2021-08-27 NOTE — Care Management (Signed)
  Transition of Care [facility]) Screening Note   Patient Details  Name: Jeffery Glenn Date of Birth: [birth year]   Transition of Care [facility]) CM/SW Contact:    Lawerance Sabal, RN Phone Number: 08/27/2021, 7:53 AM    Transition of Care Department [facility]) has reviewed patient and we will continue to monitor patient advancement through interdisciplinary progression rounds.

## 2021-08-27 NOTE — Assessment & Plan Note (Signed)
--  no reported history of shock --will request interrogation

## 2021-08-27 NOTE — Assessment & Plan Note (Signed)
--   Resolved.  Etiology unclear, suspect secondary to overdiuresis given clinical history of increased Lasix. -- Currently appears euvolemic.  As blood pressure is normally poorly controlled per patient, will not resume antihypertensives at this point but rather monitor

## 2021-08-27 NOTE — Progress Notes (Signed)
Progress Note   Patient: Jeffery Glenn OFB:[id number] DOB: [birth year] DOA: 08/25/2021     1 DOS: the patient was seen and examined on 08/27/2021   Brief hospital course: 51 year old man PMH CHF, hypertension presented with lightheadedness, "heavy limbs", presyncope, hypotension with systolic blood pressure 78, improved spontaneously.  Work-up unrevealing.  Admitted for near syncope, hypotension and patient normally on multiple blood pressure medications with difficult to control hypertension. --6/21 feels better, no nausea or vomiting.  Endorses compliance with blood pressure medications at home.  Blood pressure typically runs high systolic 160-180.  Currently not on any blood pressure medications given substantial drop will monitor today and not start back any antihypertensives given symptomatic presentation.  Reevaluate tomorrow.  Assessment and Plan: * Near syncope -- By history and presentation consistent with hypotension induced, probably from overdiuresis secondary to Lasix.  No recurrence.  We will follow-up repeat echocardiogram.  Hold antihypertensives for now given blood pressure is substantially better than baseline without antihypertensives.  No signs or symptoms to suggest acute cardiac event.  Hypotension -- Resolved.  Etiology unclear, suspect secondary to overdiuresis given clinical history of increased Lasix. -- Currently appears euvolemic.  As blood pressure is normally poorly controlled per patient, will not resume antihypertensives at this point but rather monitor  Chronic combined systolic and diastolic CHF (congestive heart failure) (HCC) --echo 09/2020 w/ LVEF 20-25%, grade I diastolic dysfunction, mildly reduced RV systolic function; pt is s/p ICD followed by Dr. Elberta Fortis --primary cardiologist saw 05/2021, noted uncontrolled HTN, started Imdur, which pt is not taking, meds: Norvasc 10, Coreg 25, hydralazine 50 TID, Entresto 97/103, Aldactone 25 daily -- Appears euvolemic  ICD  (implantable cardioverter-defibrillator) in place --no reported history of shock --will request interrogation  Essential hypertension -- Usually poorly controlled per patient was systolic 160-180.  Plan as above.        Subjective:  Feels better today No lightheadedness now No nausea, getting ready to eat Lasix was increased as outpt prior to admission in the last week, albeit for 2 days Endorses compliance with medications  Physical Exam: Vitals:   08/26/21 1750 08/26/21 2016 08/27/21 0520 08/27/21 0743  BP: (!) 135/99 (!) 140/96 129/85 133/79  Pulse: 73 71 74 83  Resp: 17 (!) 23 (!) 24 20  Temp: 98 F (36.7 C) 97.7 F (36.5 C) 98.1 F (36.7 C) 98 F (36.7 C)  TempSrc: Oral Oral Oral   SpO2: 94% 98% 100% 100%  Weight:   106 kg   Height:       Physical Exam Vitals reviewed.  Constitutional:      General: He is not in acute distress.    Appearance: He is not ill-appearing or toxic-appearing.  Cardiovascular:     Rate and Rhythm: Normal rate and regular rhythm.     Heart sounds: No murmur heard. Pulmonary:     Effort: Pulmonary effort is normal. No respiratory distress.     Breath sounds: No rhonchi or rales.  Musculoskeletal:     Right lower leg: No edema.     Left lower leg: No edema.  Neurological:     Mental Status: He is alert.  Psychiatric:        Mood and Affect: Mood normal.        Behavior: Behavior normal.     Data Reviewed:  No I/O recorded BMP noted CBC noted Fasting glucose 137 CTA chest negative EKG SR with LVH and lateral t-wave inversion (old)  Family Communication: wife at bedside  Disposition: Status is: Inpatient Remains inpatient appropriate because: low BP off meds in context of presyncope  Planned Discharge Destination: Home    Time spent: 35 minutes  Author: Brendia Sacks, MD 08/27/2021 10:49 AM  For on call review [email or web address].

## 2021-08-27 NOTE — Assessment & Plan Note (Signed)
--   Usually poorly controlled per patient was systolic 160-180.  Plan as above.

## 2021-08-27 NOTE — Assessment & Plan Note (Signed)
--   By history and presentation consistent with hypotension induced, probably from overdiuresis secondary to Lasix.  No recurrence.  We will follow-up repeat echocardiogram.  Hold antihypertensives for now given blood pressure is substantially better than baseline without antihypertensives.  No signs or symptoms to suggest acute cardiac event.

## 2021-08-27 NOTE — Assessment & Plan Note (Addendum)
--  echo 09/2020 w/ LVEF 20-25%, grade I diastolic dysfunction, mildly reduced RV systolic function; pt is s/p ICD followed by Dr. Elberta Fortis --primary cardiologist saw 05/2021, noted uncontrolled HTN, started Imdur, which pt is not taking, meds: Norvasc 10, Coreg 25, hydralazine 50 TID, Entresto 97/103, Aldactone 25 daily -- Appears euvolemic

## 2021-08-27 NOTE — Hospital Course (Signed)
51 year old man PMH CHF, hypertension presented with lightheadedness, "heavy limbs", presyncope, hypotension with systolic blood pressure 78, improved spontaneously.  Work-up unrevealing.  Admitted for near syncope, hypotension and patient normally on multiple blood pressure medications with difficult to control hypertension. --6/21 feels better, no nausea or vomiting.  Endorses compliance with blood pressure medications at home.  Blood pressure typically runs high systolic 160-180.  Currently not on any blood pressure medications given substantial drop will monitor today and not start back any antihypertensives given symptomatic presentation.  Reevaluate tomorrow.

## 2021-08-28 DIAGNOSIS — R55 Syncope and collapse: Secondary | ICD-10-CM | POA: Diagnosis not present

## 2021-08-28 DIAGNOSIS — Z95810 Presence of automatic (implantable) cardiac defibrillator: Secondary | ICD-10-CM | POA: Diagnosis not present

## 2021-08-28 DIAGNOSIS — I5042 Chronic combined systolic (congestive) and diastolic (congestive) heart failure: Secondary | ICD-10-CM | POA: Diagnosis not present

## 2021-08-28 LAB — CUP PACEART REMOTE DEVICE CHECK
Battery Remaining Percentage: 92 %
Date Time Interrogation Session: 20230614123800
Implantable Lead Implant Date: 20220902
Implantable Lead Location: 753860
Implantable Lead Model: 3501
Implantable Lead Serial Number: 225042
Implantable Pulse Generator Implant Date: 20220902
Pulse Gen Serial Number: 166628

## 2021-08-28 LAB — GLUCOSE, CAPILLARY: Glucose-Capillary: 109 mg/dL — ABNORMAL HIGH (ref 70–99)

## 2021-08-28 NOTE — Discharge Summary (Signed)
Physician Discharge Summary   Patient: Jeffery Glenn MRN: [id number] DOB: June 19, 1970  Admit date:     08/25/2021  Discharge date: 08/28/21  Discharge Physician: Jeffery Glenn   PCP: [facility]  Cardiologist: Dr. Agustin Glenn -- inbox message sent  Recommendations at discharge:   * Near syncope -- By history and presentation consistent with hypotension induced, probably from overdiuresis secondary to Lasix.  No recurrence.  Echocardiogram without significant change.  Will hold amlodipine, Entresto, hydralazine for now given blood pressure is substantially better than baseline without antihypertensives.  No signs or symptoms to suggest acute cardiac event. Will resume carvedilol and furosemide --close outpatient follow-up with cardiologist recommended. Hopefully can resume Entresto in near future.  Discharge Diagnoses: Principal Problem:   Near syncope Active Problems:   Hypotension   Chronic combined systolic and diastolic CHF (congestive heart failure) (HCC)   Essential hypertension   ICD (implantable cardioverter-defibrillator) in place  Resolved Problems:   * No resolved hospital problems. *  Hospital Course: 51 year old man PMH CHF, hypertension presented with lightheadedness, "heavy limbs", presyncope, hypotension with systolic blood pressure 78, improved spontaneously.  Work-up unrevealing.  Admitted for near syncope, hypotension and patient normally on multiple blood pressure medications with difficult to control hypertension. --6/22 feels better, no nausea or vomiting.  Endorses compliance with blood pressure medications at home.  Blood pressure typically runs high systolic 0000000.  Currently not on any blood pressure medications and SBP 130s.  ICD interrogated, no abnormalities noted.  Unclear why blood pressure is better controlled in the absence of medication.  2D echocardiogram without significant changes.  Will narrow medications for now and  recommend outpatient follow-up with cardiologist.  * Near syncope -- By history and presentation consistent with hypotension induced, probably from overdiuresis secondary to Lasix.  No recurrence.  Echocardiogram without significant change.  Will hold amlodipine, Entresto, hydralazine for now given blood pressure is substantially better than baseline without antihypertensives.  No signs or symptoms to suggest acute cardiac event. Will resume carvedilol and furosemide --close outpatient follow-up with cardiologist recommended.  Hypotension -- Resolved.  Etiology unclear, suspect secondary to overdiuresis given clinical history of increased Lasix. -- Currently appears euvolemic.  As blood pressure is normally poorly controlled per patien  Chronic combined systolic and diastolic CHF (congestive heart failure) ([place]) --echo 09/2020 w/ LVEF 0000000, grade I diastolic dysfunction, mildly reduced RV systolic function; pt is s/p ICD followed by Dr. Curt Glenn --primary cardiologist saw 05/2021, noted uncontrolled HTN, started Imdur, which pt is not taking. At that time meds: Norvasc 10, Coreg 25, hydralazine 50 TID, Entresto 97/103, Aldactone 25 daily -- Appears euvolemic  ICD (implantable cardioverter-defibrillator) in place --no reported history of shock --interrogation unremarkable  Essential hypertension -- Usually poorly controlled per patient was systolic 0000000.  Plan as above.     Consultants:  None  Procedures performed:  None  Disposition: Home Diet recommendation:  Discharge Diet Orders (From admission, onward)     Start     Ordered   08/28/21 0000  Diet - low sodium heart healthy        08/28/21 1453           Cardiac diet DISCHARGE MEDICATION: Allergies as of 08/28/2021       Reactions   Shellfish Allergy Other (See Comments)   hives   Peanut-containing Drug Products Other (See Comments)   Allergy test/ Patient eats peanuts        Medication List     STOP  taking these medications    amLODipine 10 MG tablet Commonly known as: NORVASC   Entresto 97-103 MG Generic drug: sacubitril-valsartan   hydrALAZINE 100 MG tablet Commonly known as: APRESOLINE   isosorbide mononitrate 60 MG 24 hr tablet Commonly known as: IMDUR   spironolactone 25 MG tablet Commonly known as: ALDACTONE       TAKE these medications    atorvastatin 20 MG tablet Commonly known as: LIPITOR Take 1 tablet (20 mg total) by mouth daily.   carvedilol 25 MG tablet Commonly known as: COREG Take 1 tablet (25 mg total) by mouth 2 (two) times daily with a meal.   furosemide 40 MG tablet Commonly known as: LASIX Take 1 tablet (40 mg total) by mouth daily.        Follow-up Information     Jeffery Lea, MD. Schedule an appointment as soon as possible for a visit in 1 week(s).   Specialty: Cardiology Contact information: [address] [place] [address] [phone]                Feels fine, no dizziness  Discharge Exam: Filed Weights   08/25/21 1819 08/27/21 0520 08/28/21 0500  Weight: 104.8 kg 106 kg 107.1 kg   Physical Exam Vitals reviewed.  Constitutional:      General: He is not in acute distress.    Appearance: He is not ill-appearing or toxic-appearing.  Cardiovascular:     Rate and Rhythm: Normal rate and regular rhythm.     Heart sounds: No murmur heard.    Comments: Telemetry SR Pulmonary:     Effort: Pulmonary effort is normal. No respiratory distress.     Breath sounds: No wheezing, rhonchi or rales.  Neurological:     Mental Status: He is alert.  Psychiatric:        Mood and Affect: Mood normal.        Behavior: Behavior normal.      Condition at discharge: good  The results of significant diagnostics from this hospitalization (including imaging, microbiology, ancillary and laboratory) are listed below for reference.   Imaging Studies: CUP PACEART REMOTE DEVICE CHECK  Result Date:  08/28/2021 Scheduled remote reviewed. Normal device function.  Next remote 91 days. LASensing Configuration: Secondary Gain Setting: 1X Post Shock Pacing: OFF  ECHOCARDIOGRAM COMPLETE  Result Date: 08/27/2021    ECHOCARDIOGRAM REPORT   Patient Name:   Jeffery Glenn Date of Exam: 08/27/2021 Medical Rec #:  [id number]   Height:       72.0 in Accession #:    [id number]  Weight:       233.7 lb Date of Birth:  1971/01/08   BSA:          2.276 m Patient Age:    51 years    BP:           133/79 mmHg Patient Gender: M           HR:           72 bpm. Exam Location:  Inpatient Procedure: 2D Echo, Cardiac Doppler and Color Doppler Indications:    CHF  History:        Patient has prior history of Echocardiogram examinations. CHF;                 Risk Factors:Hypertension.  Sonographer:    Cleatis Polka Referring Phys: [id number] Cecille Po MELVIN IMPRESSIONS  1. Left ventricular ejection fraction, by estimation, is 20 to 25%. The left ventricle  has severely decreased function. The left ventricle demonstrates global hypokinesis. There is mild concentric left ventricular hypertrophy. Left ventricular diastolic  parameters are consistent with Grade I diastolic dysfunction (impaired relaxation).  2. Right ventricular systolic function is mildly reduced. The right ventricular size is normal.  3. Left atrial size was moderately dilated.  4. Right atrial size was moderately dilated.  5. The mitral valve is normal in structure. Mild mitral valve regurgitation. No evidence of mitral stenosis.  6. The aortic valve is tricuspid. There is mild calcification of the aortic valve. Aortic valve regurgitation is trivial. No aortic stenosis is present.  7. The inferior vena cava is normal in size with greater than 50% respiratory variability, suggesting right atrial pressure of 3 mmHg. FINDINGS  Left Ventricle: Left ventricular ejection fraction, by estimation, is 20 to 25%. The left ventricle has severely decreased function. The left ventricle  demonstrates global hypokinesis. The left ventricular internal cavity size was normal in size. There is mild concentric left ventricular hypertrophy. Left ventricular diastolic parameters are consistent with Grade I diastolic dysfunction (impaired relaxation). Right Ventricle: The right ventricular size is normal. No increase in right ventricular wall thickness. Right ventricular systolic function is mildly reduced. Left Atrium: Left atrial size was moderately dilated. Right Atrium: Right atrial size was moderately dilated. Pericardium: There is no evidence of pericardial effusion. Mitral Valve: The mitral valve is normal in structure. Mild mitral valve regurgitation. No evidence of mitral valve stenosis. Tricuspid Valve: The tricuspid valve is normal in structure. Tricuspid valve regurgitation is not demonstrated. No evidence of tricuspid stenosis. Aortic Valve: The aortic valve is tricuspid. There is mild calcification of the aortic valve. Aortic valve regurgitation is trivial. No aortic stenosis is present. Aortic valve peak gradient measures 5.1 mmHg. Pulmonic Valve: The pulmonic valve was normal in structure. Pulmonic valve regurgitation is trivial. No evidence of pulmonic stenosis. Aorta: The aortic root is normal in size and structure. Ascending aorta measurements are within normal limits for age when indexed to body surface area. Venous: The inferior vena cava is normal in size with greater than 50% respiratory variability, suggesting right atrial pressure of 3 mmHg. IAS/Shunts: No atrial level shunt detected by color flow Doppler.  LEFT VENTRICLE PLAX 2D LVIDd:         7.30 cm      Diastology LVIDs:         5.90 cm      LV e' medial:    5.43 cm/s LV PW:         1.50 cm      LV E/e' medial:  11.3 LV IVS:        1.20 cm      LV e' lateral:   6.74 cm/s LVOT diam:     2.20 cm      LV E/e' lateral: 9.1 LV SV:         51 LV SV Index:   23 LVOT Area:     3.80 cm                              3D Volume EF: LV  Volumes (MOD)            3D EF:        34 % LV vol d, MOD A2C: 306.0 ml LV EDV:       350 ml LV vol d, MOD A4C: 295.0 ml LV ESV:  232 ml LV vol s, MOD A2C: 223.0 ml LV SV:        119 ml LV vol s, MOD A4C: 209.0 ml LV SV MOD A2C:     83.0 ml LV SV MOD A4C:     295.0 ml LV SV MOD BP:      88.5 ml RIGHT VENTRICLE            IVC RV Basal diam:  4.10 cm    IVC diam: 1.40 cm RV Mid diam:    3.10 cm RV S prime:     9.29 cm/s TAPSE (M-mode): 1.7 cm LEFT ATRIUM             Index        RIGHT ATRIUM           Index LA diam:        4.80 cm 2.11 cm/m   RA Area:     23.50 cm LA Vol (A2C):   94.8 ml 41.65 ml/m  RA Volume:   76.00 ml  33.39 ml/m LA Vol (A4C):   83.5 ml 36.68 ml/m LA Biplane Vol: 89.4 ml 39.27 ml/m  AORTIC VALVE AV Area (Vmax): 2.61 cm AV Vmax:        113.00 cm/s AV Peak Grad:   5.1 mmHg LVOT Vmax:      77.70 cm/s LVOT Vmean:     60.200 cm/s LVOT VTI:       0.135 m  AORTA Ao Root diam: 3.10 cm Ao Asc diam:  3.40 cm MITRAL VALVE MV Area (PHT): 4.29 cm    SHUNTS MV Decel Time: 177 msec    Systemic VTI:  0.14 m MR Peak grad: 75.3 mmHg    Systemic Diam: 2.20 cm MR Vmax:      434.00 cm/s MV E velocity: 61.40 cm/s MV A velocity: 56.40 cm/s MV E/A ratio:  1.09 Arvilla Meres MD Electronically signed by Arvilla Meres MD Signature Date/Time: 08/27/2021/6:58:38 PM    Final    CT Angio Chest PE W and/or Wo Contrast  Result Date: 08/25/2021 CLINICAL DATA:  Short of breath concern for pulmonary embolism EXAM: CT ANGIOGRAPHY CHEST WITH CONTRAST TECHNIQUE: Multidetector CT imaging of the chest was performed using the standard protocol during bolus administration of intravenous contrast. Multiplanar CT image reconstructions and MIPs were obtained to evaluate the vascular anatomy. RADIATION DOSE REDUCTION: This exam was performed according to the departmental dose-optimization program which includes automated exposure control, adjustment of the mA and/or kV according to patient size and/or use of iterative  reconstruction technique. CONTRAST:  OMNIPAQUE IOHEXOL 350 MG/ML SOLN COMPARISON:  None Available. FINDINGS: Cardiovascular: No filling defects within the pulmonary arteries to suggest acute pulmonary embolism. Mediastinum/Nodes: No axillary or supraclavicular adenopathy. No mediastinal or hilar adenopathy. No pericardial fluid. Esophagus normal. Is Lungs/Pleura: No pulmonary infarction. No airspace disease. Airways normal. Upper Abdomen: Limited view of the liver, kidneys, pancreas are unremarkable. Normal adrenal glands. Musculoskeletal: Generator pack in the LEFT chest wall. Lead extends along the sternum superficially. Review of the MIP images confirms the above findings. IMPRESSION: 1. No evidence of acute pulmonary embolism. 2. No acute pulmonary parenchymal findings. Electronically Signed   By: Genevive Bi M.D.   On: 08/25/2021 20:11   DG Chest 2 View  Result Date: 08/25/2021 CLINICAL DATA:  Near syncope EXAM: CHEST - 2 VIEW COMPARISON:  Chest x-ray 08/19/2021 FINDINGS: Heart is mildly enlarged. Mediastinum appears stable and within normal limits. Left-sided cardiac device stable. Pulmonary vasculature  is normal. No focal consolidation, pleural effusion or pneumothorax identified. IMPRESSION: No acute process identified. Electronically Signed   By: Ofilia Neas M.D.   On: 08/25/2021 19:07   DG Chest 2 View  Result Date: 08/19/2021 CLINICAL DATA:  Shortness of breath, pacemaker EXAM: CHEST - 2 VIEW COMPARISON:  12/10/2020 FINDINGS: Cardiomegaly. Left chest single lead pacer defibrillator. Both lungs are clear. The visualized skeletal structures are unremarkable. IMPRESSION: Cardiomegaly without acute abnormality of the lungs. Electronically Signed   By: Delanna Ahmadi M.D.   On: 08/19/2021 15:57    Microbiology: Results for orders placed or performed during the hospital encounter of 08/19/21  SARS Coronavirus 2 by RT PCR (hospital order, performed in [facility] hospital lab)  *cepheid single result test* Anterior Nasal Swab     Status: None   Collection Time: 08/19/21  6:30 PM   Specimen: Anterior Nasal Swab  Result Value Ref Range Status   SARS Coronavirus 2 by RT PCR NEGATIVE NEGATIVE Final    Comment: (NOTE) SARS-CoV-2 target nucleic acids are NOT DETECTED.  The SARS-CoV-2 RNA is generally detectable in upper and lower respiratory specimens during the acute phase of infection. The lowest concentration of SARS-CoV-2 viral copies this assay can detect is 250 copies / mL. A negative result does not preclude SARS-CoV-2 infection and should not be used as the sole basis for treatment or other patient management decisions.  A negative result may occur with improper specimen collection / handling, submission of specimen other than nasopharyngeal swab, presence of viral mutation(s) within the areas targeted by this assay, and inadequate number of viral copies (<250 copies / mL). A negative result must be combined with clinical observations, patient history, and epidemiological information.  Fact Sheet for Patients:   https://www.patel.info/  Fact Sheet for Healthcare Providers: https://hall.com/  This test is not yet approved or  cleared by the [place] FDA and has been authorized for detection and/or diagnosis of SARS-CoV-2 by FDA under an Emergency Use Authorization (EUA).  This EUA will remain in effect (meaning this test can be used) for the duration of the COVID-19 declaration under Section 564(b)(1) of the Act, 21 U.S.C. section 360bbb-3(b)(1), unless the authorization is terminated or revoked sooner.  Performed at [facility], [address] [place], [place] [address]     Labs: CBC: Recent Labs  Lab 08/25/21 1833 08/26/21 1836 08/27/21 0223  WBC 7.0 6.6 6.1  NEUTROABS 3.3  --   --   HGB 13.9 13.9 13.4  HCT 42.4 41.9 41.8  MCV 82.0 82.0 82.9  PLT 303 322 [id number]   Basic Metabolic  Panel: Recent Labs  Lab 08/25/21 1833 08/26/21 1836 08/27/21 0223  NA 136 139 138  K 3.8 4.4 3.6  CL 104 104 106  CO2 24 24 26   GLUCOSE 147* 100* 137*  BUN 18 17 19   CREATININE 1.14 1.03 0.86  CALCIUM 8.8* 9.2 9.2  MG  --  2.1  --    Liver Function Tests: No results for input(s): "AST", "ALT", "ALKPHOS", "BILITOT", "PROT", "ALBUMIN" in the last 168 hours. CBG: Recent Labs  Lab 08/27/21 0603 08/28/21 0527  GLUCAP 112* 109*    Discharge time spent: greater than 30 minutes.  Signed: Murray Hodgkins, MD [facility] 08/28/2021

## 2021-08-28 NOTE — Progress Notes (Signed)
Joey from [employer] has done an interrogation on pt's ICD, reports no abnormalities or events reported or noted. ICD in good working condition.

## 2021-08-28 NOTE — Progress Notes (Signed)
Reviewed d/c instructions with pt and wife at bedside, no distress noted at the time of d/c. MD also has seen pt right before d/c. VS wnl, pt insists on walking to d/c. PIV d/c, pressure dressing applied.

## 2021-09-15 NOTE — Progress Notes (Signed)
Remote ICD transmission.   

## 2021-10-06 ENCOUNTER — Telehealth: Payer: Self-pay | Admitting: Cardiology

## 2021-10-06 NOTE — Telephone Encounter (Signed)
Pt c/o Shortness Of Breath: STAT if SOB developed within the last 24 hours or pt is noticeably SOB on the phone  1. Are you currently SOB (can you hear that pt is SOB on the phone)? Yes, with his PCP right now   2. How long have you been experiencing SOB? A week   3. Are you SOB when sitting or when up moving around? Both   4. Are you currently experiencing any other symptoms? Pt wife states pt has been fatigue and SOB right now. Pt wife states that PCP said we need to get on the same page and wants him to be seen sooner. Pt made an appt with Dayna Dunn 10/10/21 at 10:55am

## 2021-10-06 NOTE — Telephone Encounter (Signed)
Pt called stated that he has a new PCP and is wanting to be sure Dr. Bing Matter and his PCP work together. He is having some Shortness of breath at night and she is sending him for a pulmonary referral and a sleep study. She will send notes from her visit.

## 2021-10-08 ENCOUNTER — Encounter: Payer: Self-pay | Admitting: Physician Assistant

## 2021-10-08 NOTE — Progress Notes (Deleted)
Cardiology Office Note    Date:  10/08/2021   ID:  Jeffery Glenn, DOB May 29, 1970, MRN [id number]  PCP:  [facility]  Cardiologist:  Gypsy Balsam, MD  Electrophysiologist:  None   Chief Complaint: ***  History of Present Illness:   Jeffery Glenn is a 51 y.o. male with history of HTN, NICM, chronic HFrEF, [employer] SubQ ICD placed 11/2020, obesity, ?syphilis 07/2021 who is seen for evaluation of shortness of breath. He came into Dr. Vanetta Shawl care in 05/2018 following an ER visit for shortness of breath and marked hypertension. Subsequent echo 05/2018 showed EF 25-30%. Looking back he actually had had a prior echo in 01/2008 with EF 30-40% as well. He was started on medical therapy. Coronary CTA 06/2019 had shown no evidence of CAD, + LVH. Despite medical therapy his EF remained low so underwent ICD implantation 11/2020. At last visit, his HF regimen included amlodipine, carvedilol, Lasix, hydralazine, Imdur, Entresto, and spironolactone. Imdur was added at that time. He was readmitted 08/2021 by the IM team with near-syncope with presentation felt due to hypotension and overdiuresis. His amlodipine, Entresto, hydralazine, Imdur, and spironolactone were all discontinued. Per notes, interrogation of ICD did not show any abnormalities or events. Echo showed EF 20-25%, mild LHV, G1DD, mild RV dysfunction, moderate BAE, mild MR. F/u notes also indicate referral to pulm for sleep study. Of note, he also had labs done in ED 07/2021 for inguinal lymphadenopathy with reactive RPR & T. Pallidum Ab and note indicated results faxed to [facility], do not have access to what else was performed ***  cMRI AHF clinic Hyperglycemia Suspected sleep apnea Renal duplex, TSH, UA EP due 12/2021  Shortness of breath Acute on chronic HFrEF/NICM Essential HTN with recent near-syncope suspected due to hypotension Mild mitral regurgitation Hyperglycemia  Labwork independently  reviewed: 08/2021 CBC wnl, K 3.6, Cr 0.86, Mg 2.1, trop marginally up peak 42 - chronically elevated, d-dimer up, lipase 68 07/2021 + reactive RPR & T. Pallidum Ab, neg HIV No prior TSH or lipid profile on chart   Cardiology Studies:   Studies reviewed are outlined and summarized above. Reports included below if pertinent.   2D echo 08/27/21    1. Left ventricular ejection fraction, by estimation, is 20 to 25%. The  left ventricle has severely decreased function. The left ventricle  demonstrates global hypokinesis. There is mild concentric left ventricular  hypertrophy. Left ventricular diastolic   parameters are consistent with Grade I diastolic dysfunction (impaired  relaxation).   2. Right ventricular systolic function is mildly reduced. The right  ventricular size is normal.   3. Left atrial size was moderately dilated.   4. Right atrial size was moderately dilated.   5. The mitral valve is normal in structure. Mild mitral valve  regurgitation. No evidence of mitral stenosis.   6. The aortic valve is tricuspid. There is mild calcification of the  aortic valve. Aortic valve regurgitation is trivial. No aortic stenosis is  present.   7. The inferior vena cava is normal in size with greater than 50%  respiratory variability, suggesting right atrial pressure of 3 mmHg.   Cor CT 06/2019 IMPRESSION: 1. Coronary calcium score of 0. This was 0 percentile for age and sex matched control.   2. Normal coronary origin with right dominance.  Ramus branch noted.   3. No evidence of CAD.   4. There is left ventricular hypertrophy (20 mm septum and 16 mm lateral wall).   5.  Non ischemic cardiomyopathy, probably hypertensive in etiology.   Candee Furbish, MD [facility]  IMPRESSION: No significant extra cardiac findings.   Electronically Signed: By: Suzy Bouchard M.D. On: 06/15/2019 11:18        Past Medical History:  Diagnosis Date   Atypical chest pain 05/17/2018   Atypical chest  pain 05/17/2018   Cardiomyopathy ([place]) ejection fraction 25 to 30% in February 2020, improvement to 30 to 35% in January 2021 06/09/2018   Ejection fraction 25 to 30% on echocardiogram from February 2020   Cardiomyopathy [facility]) ejection fraction 25 to 30% in February 2020, improvement to 30 to 35% in January 2021 06/09/2018   Ejection fraction 25 to 30% on echocardiogram from February 2020   Congestive heart failure (CHF) ([place]) 05/06/2018   Dyspnea on exertion 05/17/2018   Essential hypertension 05/17/2018   Hypertension     Past Surgical History:  Procedure Laterality Date   HAND SURGERY     NASAL SEPTUM SURGERY     SUBQ ICD IMPLANT N/A 11/08/2020   Procedure: SUBQ ICD IMPLANT;  Surgeon: Constance Haw, MD;  Location: [place] CV LAB;  Service: Cardiovascular;  Laterality: N/A;    Current Medications: No outpatient medications have been marked as taking for the 10/10/21 encounter (Appointment) with Charlie Pitter, PA-C.   ***   Allergies:   Shellfish allergy and Peanut-containing drug products   Social History   Socioeconomic History   Marital status: Married    Spouse name: Not on file   Number of children: Not on file   Years of education: Not on file   Highest education level: Not on file  Occupational History   Not on file  Tobacco Use   Smoking status: Never   Smokeless tobacco: Never  Vaping Use   Vaping Use: Never used  Substance and Sexual Activity   Alcohol use: Not Currently   Drug use: No   Sexual activity: Not on file  Other Topics Concern   Not on file  Social History Narrative   Not on file   Social Determinants of Health   Financial Resource Strain: Not on file  Food Insecurity: Not on file  Transportation Needs: Not on file  Physical Activity: Not on file  Stress: Not on file  Social Connections: Not on file     Family History:  The patient's ***family history includes Heart attack in his father and mother; Hypertension in his father and  mother.  ROS:   Please see the history of present illness. Otherwise, review of systems is positive for ***.  All other systems are reviewed and otherwise negative.    EKG(s)/Additional Labs   EKG:  EKG is ordered today, personally reviewed, demonstrating ***  Recent Labs: 11/30/2020: ALT 13 08/25/2021: B Natriuretic Peptide 393.7 08/26/2021: Magnesium 2.1 08/27/2021: BUN 19; Creatinine, Ser 0.86; Hemoglobin 13.4; Platelets 285; Potassium 3.6; Sodium 138  Recent Lipid Panel    Component Value Date/Time   CHOL 184 01/31/2008 2114   TRIG 62 01/31/2008 2114   HDL 52 01/31/2008 2114   CHOLHDL 3.5 Ratio 01/31/2008 2114   VLDL 12 01/31/2008 2114   LDLCALC 120 (H) 01/31/2008 2114    PHYSICAL EXAM:    VS:  There were no vitals taken for this visit.  BMI: There is no height or weight on file to calculate BMI.  GEN: Well nourished, well developed male in no acute distress HEENT: normocephalic, atraumatic Neck: no JVD, carotid bruits, or masses Cardiac: ***RRR; no murmurs,  rubs, or gallops, no edema  Respiratory:  clear to auscultation bilaterally, normal work of breathing GI: soft, nontender, nondistended, + BS MS: no deformity or atrophy Skin: warm and dry, no rash Neuro:  Alert and Oriented x 3, Strength and sensation are intact, follows commands Psych: euthymic mood, full affect  Wt Readings from Last 3 Encounters:  08/28/21 236 lb 1.8 oz (107.1 kg)  08/19/21 230 lb (104.3 kg)  08/01/21 230 lb (104.3 kg)     ASSESSMENT & PLAN:   ***     Disposition: F/u with ***   Medication Adjustments/Labs and Tests Ordered: Current medicines are reviewed at length with the patient today.  Concerns regarding medicines are outlined above. Medication changes, Labs and Tests ordered today are summarized above and listed in the Patient Instructions accessible in Encounters.   Signed, Laurann Montana, PA-C  10/08/2021 8:36 AM    [facility] Phone: [phone]; Fax: [phone]

## 2021-10-10 ENCOUNTER — Ambulatory Visit: Payer: Medicaid Other | Admitting: Physician Assistant

## 2021-10-10 DIAGNOSIS — R0602 Shortness of breath: Secondary | ICD-10-CM

## 2021-10-10 DIAGNOSIS — I10 Essential (primary) hypertension: Secondary | ICD-10-CM

## 2021-10-10 DIAGNOSIS — R739 Hyperglycemia, unspecified: Secondary | ICD-10-CM

## 2021-10-10 DIAGNOSIS — I5023 Acute on chronic systolic (congestive) heart failure: Secondary | ICD-10-CM

## 2021-10-10 DIAGNOSIS — I340 Nonrheumatic mitral (valve) insufficiency: Secondary | ICD-10-CM

## 2021-10-10 DIAGNOSIS — I428 Other cardiomyopathies: Secondary | ICD-10-CM

## 2021-10-13 ENCOUNTER — Encounter: Payer: Self-pay | Admitting: Physician Assistant

## 2021-10-13 ENCOUNTER — Encounter: Payer: Self-pay | Admitting: *Deleted

## 2021-10-13 ENCOUNTER — Ambulatory Visit (INDEPENDENT_AMBULATORY_CARE_PROVIDER_SITE_OTHER): Payer: Medicaid Other | Admitting: Physician Assistant

## 2021-10-13 VITALS — BP 178/124 | HR 84 | Ht 72.0 in | Wt 233.0 lb

## 2021-10-13 DIAGNOSIS — I160 Hypertensive urgency: Secondary | ICD-10-CM | POA: Diagnosis not present

## 2021-10-13 DIAGNOSIS — I5042 Chronic combined systolic (congestive) and diastolic (congestive) heart failure: Secondary | ICD-10-CM

## 2021-10-13 MED ORDER — ENTRESTO 97-103 MG PO TABS: 1.0000 | ORAL_TABLET | Freq: Two times a day (BID) | ORAL | 0 refills | Status: DC

## 2021-10-13 NOTE — Patient Instructions (Addendum)
Medication Instructions:  Your physician has recommended you make the following change in your medication:   RESTART Entresto 97-103 taking 1 tablet twice a day   *If you need a refill on your cardiac medications before your next appointment, please call your pharmacy*   Lab Work: None ordered  If you have labs (blood work) drawn today and your tests are completely normal, you will receive your results only by: MyChart Message (if you have MyChart) OR A paper copy in the mail If you have any lab test that is abnormal or we need to change your treatment, we will call you to review the results.   Testing/Procedures: None ordered   Follow-Up: At [facility], you and your health needs are our priority.  As part of our continuing mission to provide you with exceptional heart care, we have created designated Provider Care Teams.  These Care Teams include your primary Cardiologist (physician) and Advanced Practice Providers (APPs -  Physician Assistants and Nurse Practitioners) who all work together to provide you with the care you need, when you need it.  We recommend signing up for the patient portal called "MyChart".  Sign up information is provided on this After Visit Summary.  MyChart is used to connect with patients for Virtual Visits (Telemedicine).  Patients are able to view lab/test results, encounter notes, upcoming appointments, etc.  Non-urgent messages can be sent to your provider as well.   To learn more about what you can do with MyChart, go to [email or web address].    Your next appointment:   2 week(s)  10/28/21 ARRIVE AT 2:00   The format for your next appointment:   In Person  Provider:   Chelsea Aus, PA-C         Other Instructions   Important Information About Sugar

## 2021-10-13 NOTE — Progress Notes (Signed)
Cardiology Office Note:    Date:  10/13/2021   ID:  Jeffery Glenn, DOB February 06, 1971, MRN [id number]  PCP:  [facility]  [facility] Cardiologist:  Gypsy Balsam, MD  [facility] Electrophysiologist:  None   Chief Complaint:   History of Present Illness:    Jeffery Glenn is a 51 y.o. male with a hx of nonischemic cardiomyopathy with ejection fraction 25 to 30% s/p ICD, chronic combined congestive heart failure, hypertension which appears to be difficult to control, dyspnea on exertion seen for dyspnea.   Echo 05/2018: LVEF 25-30% Echo 03/2019: LVEF 30-35% and grade II DD Coronary CT 06/2019: no CAD. Calcium score of 0.  Echo 01/2020: LVEF 30-35% Echo 09/2020: LVEF 20-25%  Underwent ICD implantation for NICM.   Admitted 08/2020 for near syncope and found to have hypotension.  Felt probably from overdiuresis.  Echocardiogram without significant changes.  His amlodipine 10 mg daily, Entresto 97/103 mg twice daily, spironolactone 25 mg daily, isosorbide 60 mg daily and hydralazine 50 mg 3 times daily were discontinued with improved blood pressure.  Outpatient cardiology follow-up recommended but he never followed up.  He is coming for follow-up.  He reported 1 to 2-week history of dyspnea on exertion.  No exertional chest pressure.  He denies orthopnea, PND, syncope, lower extremity edema or melena.  He compliant with his Lasix and carvedilol.  Uses low-sodium diet.  Past Medical History:  Diagnosis Date   Dyspnea on exertion 05/17/2018   Essential hypertension 05/17/2018   HFrEF (heart failure with reduced ejection fraction) (HCC)    Hypertension    ICD (implantable cardioverter-defibrillator) in place    Mitral regurgitation    NICM (nonischemic cardiomyopathy) (HCC)     Past Surgical History:  Procedure Laterality Date   HAND SURGERY     NASAL SEPTUM SURGERY     SUBQ ICD IMPLANT N/A 11/08/2020   Procedure: SUBQ ICD IMPLANT;  Surgeon: Regan Lemming, MD;  Location: [facility] INVASIVE CV LAB;  Service: Cardiovascular;  Laterality: N/A;    Current Medications: Current Meds  Medication Sig   atorvastatin (LIPITOR) 20 MG tablet Take 1 tablet (20 mg total) by mouth daily.   carvedilol (COREG) 25 MG tablet Take 1 tablet (25 mg total) by mouth 2 (two) times daily with a meal.   furosemide (LASIX) 40 MG tablet Take 1 tablet (40 mg total) by mouth daily.   sacubitril-valsartan (ENTRESTO) 97-103 MG Take 1 tablet by mouth 2 (two) times daily.     Allergies:   Shellfish allergy and Peanut-containing drug products   Social History   Socioeconomic History   Marital status: Married    Spouse name: Not on file   Number of children: Not on file   Years of education: Not on file   Highest education level: Not on file  Occupational History   Not on file  Tobacco Use   Smoking status: Never   Smokeless tobacco: Never  Vaping Use   Vaping Use: Never used  Substance and Sexual Activity   Alcohol use: Not Currently   Drug use: No   Sexual activity: Not on file  Other Topics Concern   Not on file  Social History Narrative   Not on file   Social Determinants of Health   Financial Resource Strain: Not on file  Food Insecurity: Not on file  Transportation Needs: Not on file  Physical Activity: Not on file  Stress: Not on file  Social Connections: Not on  file     Family History: The patient's family history includes Heart attack in his father and mother; Hypertension in his father and mother.    ROS:   Please see the history of present illness.    All other systems reviewed and are negative.   EKGs/Labs/Other Studies Reviewed:    The following studies were reviewed today:  Echo 08/27/2021 1. Left ventricular ejection fraction, by estimation, is 20 to 25%. The  left ventricle has severely decreased function. The left ventricle  demonstrates global hypokinesis. There is mild concentric left ventricular  hypertrophy. Left  ventricular diastolic   parameters are consistent with Grade I diastolic dysfunction (impaired  relaxation).   2. Right ventricular systolic function is mildly reduced. The right  ventricular size is normal.   3. Left atrial size was moderately dilated.   4. Right atrial size was moderately dilated.   5. The mitral valve is normal in structure. Mild mitral valve  regurgitation. No evidence of mitral stenosis.   6. The aortic valve is tricuspid. There is mild calcification of the  aortic valve. Aortic valve regurgitation is trivial. No aortic stenosis is  present.   7. The inferior vena cava is normal in size with greater than 50%  respiratory variability, suggesting right atrial pressure of 3 mmHg.   Coronary CT 06/2019 IMPRESSION: 1. Coronary calcium score of 0. This was 0 percentile for age and sex matched control.   2. Normal coronary origin with right dominance.  Ramus branch noted.   3. No evidence of CAD.   4. There is left ventricular hypertrophy (20 mm septum and 16 mm lateral wall).   5. Non ischemic cardiomyopathy, probably hypertensive in etiology.   Donato Schultz, MD [facility]  EKG:  EKG is noy ordered today.   Recent Labs: 11/30/2020: ALT 13 08/25/2021: B Natriuretic Peptide 393.7 08/26/2021: Magnesium 2.1 08/27/2021: BUN 19; Creatinine, Ser 0.86; Hemoglobin 13.4; Platelets 285; Potassium 3.6; Sodium 138  Recent Lipid Panel    Component Value Date/Time   CHOL 184 01/31/2008 2114   TRIG 62 01/31/2008 2114   HDL 52 01/31/2008 2114   CHOLHDL 3.5 Ratio 01/31/2008 2114   VLDL 12 01/31/2008 2114   LDLCALC 120 (H) 01/31/2008 2114    Physical Exam:    VS:  BP (!) 178/124   Pulse 84   Ht 6' (1.829 m)   Wt 233 lb (105.7 kg)   BMI 31.60 kg/m     Wt Readings from Last 3 Encounters:  10/13/21 233 lb (105.7 kg)  08/28/21 236 lb 1.8 oz (107.1 kg)  08/19/21 230 lb (104.3 kg)     GEN:  Well nourished, well developed in no acute distress  HEENT: Normal NECK: No JVD;  No carotid bruits LYMPHATICS: No lymphadenopathy CARDIAC: RRR, no murmurs, rubs, gallops RESPIRATORY:  Clear to auscultation without rales, wheezing or rhonchi  ABDOMEN: Soft, non-tender, non-distended MUSCULOSKELETAL:  No edema; No deformity  SKIN: Warm and dry NEUROLOGIC:  Alert and oriented x 3 PSYCHIATRIC:  Normal affect   ASSESSMENT AND PLAN:    Chronic combined CHF Euvolemic.  I think his dyspnea on exertion due to elevated blood pressure.  See below.  Continue carvedilol.  Add Entresto. Recent echo with LVEF of 20-25% (stable  2.  Hypertensive urgency 3.  Dyspnea on exertion -5 antihypertensive were discontinued while admitted with near syncope in June.  No medication adjusted afterwards.  He is currently taking carvedilol and Lasix.  I think his dyspnea on exertion likely  due to elevated blood pressure.  He has no exertional chest pressure.  Prior coronary CTA without evidence of CAD. -At prior dose of Entresto -If blood pressure remains elevated I have advised to add amlodipine 5 mg -He has his old medication. -Follow-up in clinic with blood work in 2 weeks.  Medication Adjustments/Labs and Tests Ordered: Current medicines are reviewed at length with the patient today.  Concerns regarding medicines are outlined above.  No orders of the defined types were placed in this encounter.  Meds ordered this encounter  Medications   sacubitril-valsartan (ENTRESTO) 97-103 MG    Sig: Take 1 tablet by mouth 2 (two) times daily.    Dispense:  60 tablet    Refill:  0    Patient Instructions  Medication Instructions:  Your physician has recommended you make the following change in your medication:   RESTART Entresto 97-103 taking 1 tablet twice a day   *If you need a refill on your cardiac medications before your next appointment, please call your pharmacy*   Lab Work: None ordered  If you have labs (blood work) drawn today and your tests are completely normal, you will  receive your results only by: [place] (if you have MyChart) OR A paper copy in the mail If you have any lab test that is abnormal or we need to change your treatment, we will call you to review the results.   Testing/Procedures: None ordered   Follow-Up: At [facility], you and your health needs are our priority.  As part of our continuing mission to provide you with exceptional heart care, we have created designated Provider Care Teams.  These Care Teams include your primary Cardiologist (physician) and Advanced Practice Providers (APPs -  Physician Assistants and Nurse Practitioners) who all work together to provide you with the care you need, when you need it.  We recommend signing up for the patient portal called "MyChart".  Sign up information is provided on this After Visit Summary.  MyChart is used to connect with patients for Virtual Visits (Telemedicine).  Patients are able to view lab/test results, encounter notes, upcoming appointments, etc.  Non-urgent messages can be sent to your provider as well.   To learn more about what you can do with MyChart, go to [email or web address].    Your next appointment:   2 week(s)  10/28/21 ARRIVE AT 2:00   The format for your next appointment:   In Person  Provider:   Robbie Lis, PA-C         Other Instructions   Important Information About Sugar         Jarrett Soho, PA  10/13/2021 11:59 AM    [place]

## 2021-10-15 ENCOUNTER — Telehealth: Payer: Self-pay | Admitting: Physician Assistant

## 2021-10-15 ENCOUNTER — Encounter: Payer: Self-pay | Admitting: *Deleted

## 2021-10-15 NOTE — Telephone Encounter (Signed)
Bhagat, Bhavinkumar, PA  You 1 hour ago (9:29 AM)    Yes, we discussed  during office visit that he can go back to work today or tomorrow.  What's his BP readings?    I spoke with patient's wife. She checked patient's BP last night and systolic was 189.  Does not know diastolic number.  Has not checked this AM as patient was still lying down when she left.  Wife reports patient has not been feeling well the last 2 days.  He has had a headache, felt lightheaded and sleepy

## 2021-10-15 NOTE — Telephone Encounter (Signed)
Bhagat, Bhavinkumar, PA  You 8 minutes ago (12:51 PM)    That should be fine   Patient notified.  He would like to pick letter up in the office.  Letter left at front desk.

## 2021-10-15 NOTE — Telephone Encounter (Signed)
Bhagat, Bhavinkumar, PA  You 48 minutes ago (11:42 AM)    Would recommend to follow up with PCP.   I have advised to start high does of Entresto during visit. If taking and BP still high, start Amlodipine 5 mg qd   I spoke with patient's wife and asked her to check patient's BP for the next 5 days and call readings to office.  I let her know a new medication may need to be added based on those readings. I advised patient's wife to have patient follow up with PCP for recent complaints.  Patient was to return to work this evening.  He does not feel he can return tonight.  He is asking for letter extending his time out of work. He is hoping to return to work on Monday

## 2021-10-15 NOTE — Telephone Encounter (Signed)
Pt spouse called requesting a letter saying the pt can go back to work since his last visit 10/13/21

## 2021-10-24 ENCOUNTER — Encounter: Payer: Self-pay | Admitting: Adult Health

## 2021-10-24 ENCOUNTER — Ambulatory Visit (INDEPENDENT_AMBULATORY_CARE_PROVIDER_SITE_OTHER): Payer: Medicaid Other | Admitting: Adult Health

## 2021-10-24 DIAGNOSIS — R0683 Snoring: Secondary | ICD-10-CM

## 2021-10-24 NOTE — Assessment & Plan Note (Signed)
Snoring , daytime sleepiness , restless sleep,hx of CHF and BMI 31 all concerning for OSA  Set up for split night sleep study  - discussed how weight can impact sleep and risk for sleep disordered breathing - discussed options to assist with weight loss: combination of diet modification, cardiovascular and strength training exercises   - had an extensive discussion regarding the adverse health consequences related to untreated sleep disordered breathing - specifically discussed the risks for hypertension, coronary artery disease, cardiac dysrhythmias, cerebrovascular disease, and diabetes - lifestyle modification discussed   - discussed how sleep disruption can increase risk of accidents, particularly when driving - safe driving practices were discussed    Plan  Patient Instructions  Set up for split-night sleep study Work on healthy weight loss Healthy sleep regimen Do not drive if sleepy Can try Melatonin for insomnia As needed   Follow-up in 2 months to discuss sleep study results and treatment plan

## 2021-10-24 NOTE — Progress Notes (Signed)
Received the first [place] covid vaccine.

## 2021-10-24 NOTE — Patient Instructions (Addendum)
Set up for split-night sleep study Work on healthy weight loss Healthy sleep regimen Do not drive if sleepy Can try Melatonin for insomnia As needed   Follow-up in 2 months to discuss sleep study results and treatment plan

## 2021-10-24 NOTE — Progress Notes (Signed)
We asked  @Patient  ID: Jeffery Glenn, male    DOB: [birth year], 51 y.o.   MRN: [id number]  Chief Complaint  Patient presents with   Consult    Referring provider: [id number], Norm Salt  HPI: 51 year old male seen for sleep consult October 24, 2021 for daytime sleepiness, snoring, restless sleep  TEST/EVENTS :   10/24/2021 Sleep consult  Presents for a sleep consult today.  Kindly referred by primary care provider 10/26/2021, PA.  Patient complains of snoring, restless sleep and daytime sleepiness.  Feels tired when he wakes up. Patient says he works night shifts.  Typically goes to bed about 10 AM.  Takes only about 15 minutes to go to sleep.  Is up at least 2 times throughout the night.  And gets up about 3 PM.  Epworth score is 8 out of 24.  Typically gets sleepy in the end if a passenger of a car.  Also gets sleepy after eating.  Patient denies any removable dental work suspicious for cataplexy or sleep paralysis.  Has never had a sleep study 4.  Weight has been unchanged over the last 2 years.  Current weight is at 234 pounds with a BMI at 31 Works night shift 12 hr 6 pm to 6 am. On days off he keeps same schedule. No sleep aides . Rare caffeine.  Having trouble getting blood pressure under control. PCP worried sleep apnea could be causing issues.   Medical history significant for hypertension, congestive heart failure, hypertension, nonischemic cardiomyopathy status post ICD,  hyperlipidemia  Family history positive for coronary artery disease and hypertension  Surgical history nasal septum surgery and ICD implant (9/202)   Social history patient lives alone.  He works as a 08-11-1988.  Has 7 children (ages 90 to 53) .  He is married. Is a never smoker.  Social alcohol.  No drug use.    Allergies  Allergen Reactions   Shellfish Allergy Other (See Comments)    hives   Peanut-Containing Drug Products Other (See Comments)    Allergy test/ Patient eats peanuts     Immunization History  Administered Date(s) Administered   Influenza,inj,Quad PF,6+ Mos 12/06/2020   Tdap 08/20/2015    Past Medical History:  Diagnosis Date   Dyspnea on exertion 05/17/2018   Essential hypertension 05/17/2018   HFrEF (heart failure with reduced ejection fraction) (HCC)    Hypertension    ICD (implantable cardioverter-defibrillator) in place    Mitral regurgitation    NICM (nonischemic cardiomyopathy) (HCC)     Tobacco History: Social History   Tobacco Use  Smoking Status Never   Passive exposure: Past  Smokeless Tobacco Never   Counseling given: Not Answered   Outpatient Medications Prior to Visit  Medication Sig Dispense Refill   atorvastatin (LIPITOR) 20 MG tablet Take 1 tablet (20 mg total) by mouth daily. 90 tablet 3   carvedilol (COREG) 25 MG tablet Take 1 tablet (25 mg total) by mouth 2 (two) times daily with a meal. 180 tablet 2   furosemide (LASIX) 40 MG tablet Take 1 tablet (40 mg total) by mouth daily. 90 tablet 2   sacubitril-valsartan (ENTRESTO) 97-103 MG Take 1 tablet by mouth 2 (two) times daily. 60 tablet 0   No facility-administered medications prior to visit.     Review of Systems:   Constitutional:   No  weight loss, night sweats,  Fevers, chills,  +fatigue, or  lassitude.  HEENT:   No headaches,  Difficulty swallowing,  Tooth/dental  problems, or  Sore throat,                No sneezing, itching, ear ache, nasal congestion, post nasal drip,   CV:  No chest pain,  Orthopnea, PND, swelling in lower extremities, anasarca, dizziness, palpitations, syncope.   GI  No heartburn, indigestion, abdominal pain, nausea, vomiting, diarrhea, change in bowel habits, loss of appetite, bloody stools.   Resp: No shortness of breath with exertion or at rest.  No excess mucus, no productive cough,  No non-productive cough,  No coughing up of blood.  No change in color of mucus.  No wheezing.  No chest wall deformity  Skin: no rash or  lesions.  GU: no dysuria, change in color of urine, no urgency or frequency.  No flank pain, no hematuria   MS:  No joint pain or swelling.  No decreased range of motion.  No back pain.    Physical Exam  BP (!) 140/100 (BP Location: Left Arm, Patient Position: Sitting, Cuff Size: Large)   Pulse 92   Temp 98.2 F (36.8 C) (Oral)   Ht 6' (1.829 m)   Wt 234 lb 9.6 oz (106.4 kg)   SpO2 99%   BMI 31.82 kg/m   GEN: A/Ox3; pleasant , NAD, well nourished    HEENT:  [place]/AT,  NOSE-clear, THROAT-clear, no lesions, no postnasal drip or exudate noted. Class 3 MP airway   NECK:  Supple w/ fair ROM; no JVD; normal carotid impulses w/o bruits; no thyromegaly or nodules palpated; no lymphadenopathy.    RESP  Clear  P & A; w/o, wheezes/ rales/ or rhonchi. no accessory muscle use, no dullness to percussion  CARD:  RRR, no m/r/g, no peripheral edema, pulses intact, no cyanosis or clubbing.  GI:   Soft & nt; nml bowel sounds; no organomegaly or masses detected.   Musco: Warm bil, no deformities or joint swelling noted.   Neuro: alert, no focal deficits noted.    Skin: Warm, no lesions or rashes    Lab Results:  CBC   BMET   BNP    Imaging: No results found.        No data to display          No results found for: "NITRICOXIDE"      Assessment & Plan:   Snoring Snoring , daytime sleepiness , restless sleep,hx of CHF and BMI 31 all concerning for OSA  Set up for split night sleep study  - discussed how weight can impact sleep and risk for sleep disordered breathing - discussed options to assist with weight loss: combination of diet modification, cardiovascular and strength training exercises   - had an extensive discussion regarding the adverse health consequences related to untreated sleep disordered breathing - specifically discussed the risks for hypertension, coronary artery disease, cardiac dysrhythmias, cerebrovascular disease, and diabetes - lifestyle  modification discussed   - discussed how sleep disruption can increase risk of accidents, particularly when driving - safe driving practices were discussed    Plan  Patient Instructions  Set up for split-night sleep study Work on healthy weight loss Healthy sleep regimen Do not drive if sleepy Can try Melatonin for insomnia As needed   Follow-up in 2 months to discuss sleep study results and treatment plan        Rubye Oaks, NP 10/24/2021

## 2021-10-28 ENCOUNTER — Ambulatory Visit: Payer: Medicaid Other | Admitting: Physician Assistant

## 2021-10-28 NOTE — Progress Notes (Deleted)
Cardiology Office Note:    Date:  10/28/2021   ID:  Jeffery Glenn, DOB [birth year], MRN [id number]  PCP:  [facility]  [facility] Cardiologist:  Gypsy Balsam, MD  [facility] Electrophysiologist:  None   Chief Complaint: Blood pressure management  History of Present Illness:    Jeffery Glenn is a 51 y.o. male with a hx of nonischemic cardiomyopathy with ejection fraction 25 to 30% s/p ICD, chronic combined congestive heart failure, hypertension which appears to be difficult to control  and dyspnea on exertion seen for follow up.   Echo 05/2018: LVEF 25-30% Echo 03/2019: LVEF 30-35% and grade II DD Coronary CT 06/2019: no CAD. Calcium score of 0.  Echo 01/2020: LVEF 30-35% Echo 09/2020: LVEF 20-25%   Underwent ICD implantation for NICM.    Admitted 08/2020 for near syncope and found to have hypotension.  Felt probably from overdiuresis.  Echocardiogram without significant changes.  His amlodipine 10 mg daily, Entresto 97/103 mg twice daily, spironolactone 25 mg daily, isosorbide 60 mg daily and hydralazine 50 mg 3 times daily were discontinued with improved blood pressure. Outpatient cardiology follow-up recommended but he never followed up.   Seem by me 10/13/21 for reported 1 to 2-week history of dyspnea on exertion.  No exertional chest pressure.  Elevated blood pressure.  He compliant with his Lasix and carvedilol.  Uses low-sodium diet.  Restarted Entresto.  Seen by pulmonary 8/19 and has pending sleep study.  Past Medical History:  Diagnosis Date   Dyspnea on exertion 05/17/2018   Essential hypertension 05/17/2018   HFrEF (heart failure with reduced ejection fraction) (HCC)    Hypertension    ICD (implantable cardioverter-defibrillator) in place    Mitral regurgitation    NICM (nonischemic cardiomyopathy) (HCC)     Past Surgical History:  Procedure Laterality Date   HAND SURGERY     NASAL SEPTUM SURGERY     SUBQ ICD IMPLANT N/A 11/08/2020    Procedure: SUBQ ICD IMPLANT;  Surgeon: Regan Lemming, MD;  Location: [facility] INVASIVE CV LAB;  Service: Cardiovascular;  Laterality: N/A;    Current Medications: No outpatient medications have been marked as taking for the 10/28/21 encounter (Appointment) with Manson Passey, PA.     Allergies:   Shellfish allergy and Peanut-containing drug products   Social History   Socioeconomic History   Marital status: Married    Spouse name: Not on file   Number of children: Not on file   Years of education: Not on file   Highest education level: Not on file  Occupational History   Not on file  Tobacco Use   Smoking status: Never    Passive exposure: Past   Smokeless tobacco: Never  Vaping Use   Vaping Use: Never used  Substance and Sexual Activity   Alcohol use: Not Currently   Drug use: No   Sexual activity: Not on file  Other Topics Concern   Not on file  Social History Narrative   Not on file   Social Determinants of Health   Financial Resource Strain: Not on file  Food Insecurity: Not on file  Transportation Needs: Not on file  Physical Activity: Not on file  Stress: Not on file  Social Connections: Not on file     Family History: The patient's family history includes Heart attack in his father and mother; Hypertension in his father and mother.  ***  ROS:   Please see the history of present illness.  All other systems reviewed and are negative. ***  EKGs/Labs/Other Studies Reviewed:    The following studies were reviewed today: Echo 08/27/2021 1. Left ventricular ejection fraction, by estimation, is 20 to 25%. The  left ventricle has severely decreased function. The left ventricle  demonstrates global hypokinesis. There is mild concentric left ventricular  hypertrophy. Left ventricular diastolic   parameters are consistent with Grade I diastolic dysfunction (impaired  relaxation).   2. Right ventricular systolic function is mildly reduced. The right   ventricular size is normal.   3. Left atrial size was moderately dilated.   4. Right atrial size was moderately dilated.   5. The mitral valve is normal in structure. Mild mitral valve  regurgitation. No evidence of mitral stenosis.   6. The aortic valve is tricuspid. There is mild calcification of the  aortic valve. Aortic valve regurgitation is trivial. No aortic stenosis is  present.   7. The inferior vena cava is normal in size with greater than 50%  respiratory variability, suggesting right atrial pressure of 3 mmHg.    Coronary CT 06/2019 IMPRESSION: 1. Coronary calcium score of 0. This was 0 percentile for age and sex matched control.   2. Normal coronary origin with right dominance.  Ramus branch noted.   3. No evidence of CAD.   4. There is left ventricular hypertrophy (20 mm septum and 16 mm lateral wall).   5. Non ischemic cardiomyopathy, probably hypertensive in etiology.   Donato Schultz, MD [facility]  EKG:  EKG is *** ordered today.  The ekg ordered today demonstrates ***  Recent Labs: 11/30/2020: ALT 13 08/25/2021: B Natriuretic Peptide 393.7 08/26/2021: Magnesium 2.1 08/27/2021: BUN 19; Creatinine, Ser 0.86; Hemoglobin 13.4; Platelets 285; Potassium 3.6; Sodium 138  Recent Lipid Panel    Component Value Date/Time   CHOL 184 01/31/2008 2114   TRIG 62 01/31/2008 2114   HDL 52 01/31/2008 2114   CHOLHDL 3.5 Ratio 01/31/2008 2114   VLDL 12 01/31/2008 2114   LDLCALC 120 (H) 01/31/2008 2114     Risk Assessment/Calculations:   {Does this patient have ATRIAL FIBRILLATION?[phone]}   Physical Exam:    VS:  There were no vitals taken for this visit.    Wt Readings from Last 3 Encounters:  10/24/21 234 lb 9.6 oz (106.4 kg)  10/13/21 233 lb (105.7 kg)  08/28/21 236 lb 1.8 oz (107.1 kg)     GEN: *** Well nourished, well developed in no acute distress HEENT: Normal NECK: No JVD; No carotid bruits LYMPHATICS: No lymphadenopathy CARDIAC: ***RRR, no murmurs,  rubs, gallops RESPIRATORY:  Clear to auscultation without rales, wheezing or rhonchi  ABDOMEN: Soft, non-tender, non-distended MUSCULOSKELETAL:  No edema; No deformity  SKIN: Warm and dry NEUROLOGIC:  Alert and oriented x 3 PSYCHIATRIC:  Normal affect   ASSESSMENT AND PLAN:   Chronic combined CHF Euvolemic.  I think his dyspnea on exertion due to elevated blood pressure.  See below.  Continue carvedilol.  Add Entresto. Recent echo with LVEF of 20-25% (stable   2.  Hypertensive urgency 3.  Dyspnea on exertion -5 antihypertensive were discontinued while admitted with near syncope in June.  No medication adjusted afterwards.  He is currently taking carvedilol and Lasix.  I think his dyspnea on exertion likely due to elevated blood pressure.  He has no exertional chest pressure.  Prior coronary CTA without evidence of CAD. -At prior dose of Entresto -If blood pressure remains elevated I have advised to add amlodipine 5 mg -He  has his old medication. -Follow-up in clinic with blood work in 2 weeks.  Medication Adjustments/Labs and Tests Ordered: Current medicines are reviewed at length with the patient today.  Concerns regarding medicines are outlined above.  No orders of the defined types were placed in this encounter.  No orders of the defined types were placed in this encounter.   There are no Patient Instructions on file for this visit.   Lorelei Pont, [place]  10/28/2021 10:39 AM    [facility]

## 2021-10-29 ENCOUNTER — Telehealth: Payer: Self-pay | Admitting: Cardiology

## 2021-10-29 NOTE — Telephone Encounter (Signed)
Pt c/o Shortness Of Breath: STAT if SOB developed within the last 24 hours or pt is noticeably SOB on the phone  1. Are you currently SOB (can you hear that pt is SOB on the phone)?   No  2. How long have you been experiencing SOB?  Comes and goes  3. Are you SOB when sitting or when up moving around? Moving around  4. Are you currently experiencing any other symptoms? No  Wife called stating patient is concerned about his shortness of breath and would like a sooner appointment.

## 2021-10-29 NOTE — Telephone Encounter (Signed)
Spoke with spouse. She stated that pt has increased Shortness of breath and would like to see Dr. Bing Matter. Will send to front desk for work in appt. Pt will come in Friday Aug 25 at 10:00 AM in the [place] office. Spouse notified of appt.

## 2021-10-31 ENCOUNTER — Ambulatory Visit: Payer: Medicaid Other | Admitting: Cardiology

## 2021-11-01 NOTE — Progress Notes (Signed)
Reviewed and agree with assessment/plan.   Tamsen Reist, MD [facility] 11/01/2021, 2:26 PM Pager:  [phone]  

## 2021-11-11 ENCOUNTER — Ambulatory Visit: Payer: Medicaid Other | Attending: Physician Assistant | Admitting: Physician Assistant

## 2021-11-11 ENCOUNTER — Encounter: Payer: Self-pay | Admitting: Physician Assistant

## 2021-11-11 VITALS — BP 110/74 | HR 65 | Ht 72.0 in | Wt 235.0 lb

## 2021-11-11 DIAGNOSIS — I5042 Chronic combined systolic (congestive) and diastolic (congestive) heart failure: Secondary | ICD-10-CM

## 2021-11-11 DIAGNOSIS — I10 Essential (primary) hypertension: Secondary | ICD-10-CM | POA: Diagnosis not present

## 2021-11-11 DIAGNOSIS — I420 Dilated cardiomyopathy: Secondary | ICD-10-CM

## 2021-11-11 DIAGNOSIS — I428 Other cardiomyopathies: Secondary | ICD-10-CM | POA: Diagnosis not present

## 2021-11-11 NOTE — Patient Instructions (Addendum)
Medication Instructions:  Your physician recommends that you continue on your current medications as directed. Please refer to the Current Medication list given to you today. *If you need a refill on your cardiac medications before your next appointment, please call your pharmacy*   Lab Work: TODAY-BMET If you have labs (blood work) drawn today and your tests are completely normal, you will receive your results only by: MyChart Message (if you have MyChart) OR A paper copy in the mail If you have any lab test that is abnormal or we need to change your treatment, we will call you to review the results.   Testing/Procedures: NONE ORDERED   Follow-Up: At [facility], you and your health needs are our priority.  As part of our continuing mission to provide you with exceptional heart care, we have created designated Provider Care Teams.  These Care Teams include your primary Cardiologist (physician) and Advanced Practice Providers (APPs -  Physician Assistants and Nurse Practitioners) who all work together to provide you with the care you need, when you need it.  We recommend signing up for the patient portal called "MyChart".  Sign up information is provided on this After Visit Summary.  MyChart is used to connect with patients for Virtual Visits (Telemedicine).  Patients are able to view lab/test results, encounter notes, upcoming appointments, etc.  Non-urgent messages can be sent to your provider as well.   To learn more about what you can do with MyChart, go to [email or web address].    Your next appointment:   4 month(s)  The format for your next appointment:   In Person  Provider:   Gypsy Balsam, MD  Other Instructions   Important Information About Sugar

## 2021-11-11 NOTE — Progress Notes (Signed)
Cardiology Office Note:    Date:  11/11/2021   ID:  Jeffery Glenn, DOB 1971-02-28, MRN [id number]  PCP:  [facility]  [facility] Cardiologist:  Gypsy Balsam, MD  [facility] Electrophysiologist:  None   Chief Complaint: 4 weeks follow up   History of Present Illness:    Jeffery Glenn is a 51 y.o. male with a hx of  nonischemic cardiomyopathy with ejection fraction 25 to 30% s/p ICD, chronic combined congestive heart failure, hypertension which appears to be difficult to control, dyspnea on exertion seen for follow up.   Echo 05/2018: LVEF 25-30% Echo 03/2019: LVEF 30-35% and grade II DD Coronary CT 06/2019: no CAD. Calcium score of 0.  Echo 01/2020: LVEF 30-35% Echo 09/2020: LVEF 20-25%   Underwent ICD implantation for NICM.    Admitted 08/2020 for near syncope and found to have hypotension.  Felt probably from overdiuresis.  Echocardiogram without significant changes.  His amlodipine 10 mg daily, Entresto 97/103 mg twice daily, spironolactone 25 mg daily, isosorbide 60 mg daily and hydralazine 50 mg 3 times daily were discontinued with improved blood pressure. Outpatient cardiology follow-up recommended but he never followed up.  Reported DOE when seen by me 10/13/21. BP was elevated. Added entresto.  Seen by pulmonologist 10/24/21 and has pending sleep study.   Here today for follow-up.  Since last office visit he is feeling much better.  Blood pressure is excellently controlled.  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  He only takes Lasix on nonworking days which is 3 to 4 days/week.Marland Kitchen  He denies chest pain, shortness of breath, orthopnea, PND, syncope, lower extremity edema or melena.   Past Medical History:  Diagnosis Date   Dyspnea on exertion 05/17/2018   Essential hypertension 05/17/2018   HFrEF (heart failure with reduced ejection fraction) (HCC)    Hypertension    ICD (implantable cardioverter-defibrillator) in place    Mitral  regurgitation    NICM (nonischemic cardiomyopathy) (HCC)     Past Surgical History:  Procedure Laterality Date   HAND SURGERY     NASAL SEPTUM SURGERY     SUBQ ICD IMPLANT N/A 11/08/2020   Procedure: SUBQ ICD IMPLANT;  Surgeon: Regan Lemming, MD;  Location: [facility];  Service: Cardiovascular;  Laterality: N/A;    Current Medications: Current Meds  Medication Sig   atorvastatin (LIPITOR) 20 MG tablet Take 1 tablet (20 mg total) by mouth daily.   carvedilol (COREG) 25 MG tablet Take 1 tablet (25 mg total) by mouth 2 (two) times daily with a meal.   furosemide (LASIX) 40 MG tablet Take 1 tablet (40 mg total) by mouth daily.   sacubitril-valsartan (ENTRESTO) 97-103 MG Take 1 tablet by mouth 2 (two) times daily.     Allergies:   Shellfish allergy and Peanut-containing drug products   Social History   Socioeconomic History   Marital status: Married    Spouse name: Not on file   Number of children: Not on file   Years of education: Not on file   Highest education level: Not on file  Occupational History   Not on file  Tobacco Use   Smoking status: Never    Passive exposure: Past   Smokeless tobacco: Never  Vaping Use   Vaping Use: Never used  Substance and Sexual Activity   Alcohol use: Not Currently   Drug use: No   Sexual activity: Not on file  Other Topics Concern  Not on file  Social History Narrative   Not on file   Social Determinants of Health   Financial Resource Strain: Not on file  Food Insecurity: Not on file  Transportation Needs: Not on file  Physical Activity: Not on file  Stress: Not on file  Social Connections: Not on file     Family History: The patient's family history includes Heart attack in his father and mother; Hypertension in his father and mother.    ROS:   Please see the history of present illness.    All other systems reviewed and are negative.   EKGs/Labs/Other Studies Reviewed:    The following studies were  reviewed today:  Echo 08/27/2021 1. Left ventricular ejection fraction, by estimation, is 20 to 25%. The  left ventricle has severely decreased function. The left ventricle  demonstrates global hypokinesis. There is mild concentric left ventricular  hypertrophy. Left ventricular diastolic   parameters are consistent with Grade I diastolic dysfunction (impaired  relaxation).   2. Right ventricular systolic function is mildly reduced. The right  ventricular size is normal.   3. Left atrial size was moderately dilated.   4. Right atrial size was moderately dilated.   5. The mitral valve is normal in structure. Mild mitral valve  regurgitation. No evidence of mitral stenosis.   6. The aortic valve is tricuspid. There is mild calcification of the  aortic valve. Aortic valve regurgitation is trivial. No aortic stenosis is  present.   7. The inferior vena cava is normal in size with greater than 50%  respiratory variability, suggesting right atrial pressure of 3 mmHg.    Coronary CT 06/2019 IMPRESSION: 1. Coronary calcium score of 0. This was 0 percentile for age and sex matched control.   2. Normal coronary origin with right dominance.  Ramus branch noted.   3. No evidence of CAD.   4. There is left ventricular hypertrophy (20 mm septum and 16 mm lateral wall).   5. Non ischemic cardiomyopathy, probably hypertensive in etiology.   Candee Furbish, MD [facility]  EKG:  EKG is  ordered today.   Recent Labs: 11/30/2020: ALT 13 08/25/2021: B Natriuretic Peptide 393.7 08/26/2021: Magnesium 2.1 08/27/2021: BUN 19; Creatinine, Ser 0.86; Hemoglobin 13.4; Platelets 285; Potassium 3.6; Sodium 138  Recent Lipid Panel    Component Value Date/Time   CHOL 184 01/31/2008 2114   TRIG 62 01/31/2008 2114   HDL 52 01/31/2008 2114   CHOLHDL 3.5 Ratio 01/31/2008 2114   VLDL 12 01/31/2008 2114   LDLCALC 120 (H) 01/31/2008 2114    Physical Exam:    VS:  BP 110/74   Pulse 65   Ht 6' (1.829 m)   Wt 235  lb (106.6 kg)   SpO2 97%   BMI 31.87 kg/m     Wt Readings from Last 3 Encounters:  11/11/21 235 lb (106.6 kg)  10/24/21 234 lb 9.6 oz (106.4 kg)  10/13/21 233 lb (105.7 kg)     GEN:  Well nourished, well developed in no acute distress HEENT: Normal NECK: No JVD; No carotid bruits LYMPHATICS: No lymphadenopathy CARDIAC: RRR, no murmurs, rubs, gallops RESPIRATORY:  Clear to auscultation without rales, wheezing or rhonchi  ABDOMEN: Soft, non-tender, non-distended MUSCULOSKELETAL:  No edema; No deformity  SKIN: Warm and dry NEUROLOGIC:  Alert and oriented x 3 PSYCHIATRIC:  Normal affect   ASSESSMENT AND PLAN:    Nonischemic cardiomyopathy/chronic systolic heart failure Euvolemic.  Compliant with low-sodium diet.  Continue Lasix 3 to 4  days/week depending on work schedule. Continue carvedilol 25 mg twice daily Continue Entresto 97/103 mg twice daily  2.  Hypertension Blood pressure control on current medication  3. Daytime sleepiness - Works at night 3-4 days/week. Has pending sleep study.   Medication Adjustments/Labs and Tests Ordered: Current medicines are reviewed at length with the patient today.  Concerns regarding medicines are outlined above.  Orders Placed This Encounter  Procedures   Basic Metabolic Panel (BMET)   No orders of the defined types were placed in this encounter.   Patient Instructions  Medication Instructions:  Your physician recommends that you continue on your current medications as directed. Please refer to the Current Medication list given to you today. *If you need a refill on your cardiac medications before your next appointment, please call your pharmacy*   Lab Work: TODAY-BMET If you have labs (blood work) drawn today and your tests are completely normal, you will receive your results only by: MyChart Message (if you have MyChart) OR A paper copy in the mail If you have any lab test that is abnormal or we need to change your  treatment, we will call you to review the results.   Testing/Procedures: NONE ORDERED   Follow-Up: At [facility], you and your health needs are our priority.  As part of our continuing mission to provide you with exceptional heart care, we have created designated Provider Care Teams.  These Care Teams include your primary Cardiologist (physician) and Advanced Practice Providers (APPs -  Physician Assistants and Nurse Practitioners) who all work together to provide you with the care you need, when you need it.  We recommend signing up for the patient portal called "MyChart".  Sign up information is provided on this After Visit Summary.  MyChart is used to connect with patients for Virtual Visits (Telemedicine).  Patients are able to view lab/test results, encounter notes, upcoming appointments, etc.  Non-urgent messages can be sent to your provider as well.   To learn more about what you can do with MyChart, go to [email or web address].    Your next appointment:   4 month(s)  The format for your next appointment:   In Person  Provider:   Belva Crome, MD    Other Instructions   Important Information About Sugar         Signed, Manson Passey, [place]  11/11/2021 3:24 PM    [facility]

## 2021-11-12 LAB — BASIC METABOLIC PANEL
BUN/Creatinine Ratio: 14 (ref 9–20)
BUN: 13 mg/dL (ref 6–24)
CO2: 23 mmol/L (ref 20–29)
Calcium: 9.6 mg/dL (ref 8.7–10.2)
Chloride: 103 mmol/L (ref 96–106)
Creatinine, Ser: 0.91 mg/dL (ref 0.76–1.27)
Glucose: 108 mg/dL — ABNORMAL HIGH (ref 70–99)
Potassium: 4.1 mmol/L (ref 3.5–5.2)
Sodium: 140 mmol/L (ref 134–144)
eGFR: 103 mL/min/{1.73_m2} (ref >59)

## 2021-11-14 ENCOUNTER — Ambulatory Visit: Payer: Medicaid Other | Admitting: Cardiology

## 2021-11-24 ENCOUNTER — Ambulatory Visit (INDEPENDENT_AMBULATORY_CARE_PROVIDER_SITE_OTHER): Payer: Medicaid Other

## 2021-11-24 DIAGNOSIS — I420 Dilated cardiomyopathy: Secondary | ICD-10-CM

## 2021-11-24 DIAGNOSIS — I5042 Chronic combined systolic (congestive) and diastolic (congestive) heart failure: Secondary | ICD-10-CM | POA: Diagnosis not present

## 2021-11-28 LAB — CUP PACEART REMOTE DEVICE CHECK
Battery Remaining Percentage: 88 %
Date Time Interrogation Session: 20230921161400
Implantable Lead Implant Date: 20220902
Implantable Lead Location: 753860
Implantable Lead Model: 3501
Implantable Lead Serial Number: 225042
Implantable Pulse Generator Implant Date: 20220902
Pulse Gen Serial Number: 166628

## 2021-12-04 ENCOUNTER — Other Ambulatory Visit: Payer: Self-pay

## 2021-12-04 DIAGNOSIS — R0683 Snoring: Secondary | ICD-10-CM

## 2021-12-06 NOTE — Progress Notes (Signed)
Remote ICD transmission.   

## 2021-12-23 ENCOUNTER — Encounter: Payer: Self-pay | Admitting: Adult Health

## 2021-12-24 ENCOUNTER — Telehealth: Payer: Self-pay | Admitting: *Deleted

## 2021-12-24 NOTE — Telephone Encounter (Signed)
ATC patient x1 to see if he has had his sleep study done.  No answer.  No VM.

## 2021-12-25 ENCOUNTER — Ambulatory Visit: Payer: Medicaid Other | Admitting: Adult Health

## 2021-12-25 ENCOUNTER — Telehealth: Payer: Self-pay | Admitting: Adult Health

## 2021-12-25 NOTE — Telephone Encounter (Signed)
-----   Message from Vivia Budge sent at 12/04/2021  3:30 PM EDT ----- Regarding: RE: Appeal P2P HST is awaiting scheduling, no auth required for this pt's HST. Thanks  ----- Message ----- From: Melvenia Needles, NP Sent: 12/01/2021   9:20 AM EDT To: Vanessa Barbara, RN; Vivia Budge Subject: RE: Appeal P2P                                 Will they cover a Home sleep study can put order in ????  He should have in lab with CHF hx, the insurance are going against medical standards for for OSA pt w/ CHF . But if they will not cover then patient should not have to pay out of pocket    ----- Message ----- From: Vivia Budge Sent: 11/28/2021   3:43 PM EDT To: Melvenia Needles, NP; Satira Anis Subject: Appeal P2P                                     CASE ID: [id number] This message is sent informing you that this patient's request for In lab sleep study has been denied by [employer]. Appeal department requested we schedule a peer to peer call with them directly.    Please provide the following information to proceed. Thanks   Phone number:  Preferred Appointment Date & Time:

## 2021-12-30 ENCOUNTER — Other Ambulatory Visit: Payer: Self-pay

## 2021-12-30 ENCOUNTER — Encounter (HOSPITAL_BASED_OUTPATIENT_CLINIC_OR_DEPARTMENT_OTHER): Payer: Self-pay | Admitting: Pediatrics

## 2021-12-30 ENCOUNTER — Emergency Department (HOSPITAL_BASED_OUTPATIENT_CLINIC_OR_DEPARTMENT_OTHER): Payer: Medicaid Other

## 2021-12-30 ENCOUNTER — Emergency Department (HOSPITAL_BASED_OUTPATIENT_CLINIC_OR_DEPARTMENT_OTHER)
Admission: EM | Admit: 2021-12-30 | Discharge: 2021-12-30 | Disposition: A | Discharge status: Home / Self Care | Payer: Medicaid Other | Attending: Emergency Medicine | Admitting: Emergency Medicine

## 2021-12-30 DIAGNOSIS — R2243 Localized swelling, mass and lump, lower limb, bilateral: Secondary | ICD-10-CM | POA: Diagnosis not present

## 2021-12-30 DIAGNOSIS — I110 Hypertensive heart disease with heart failure: Secondary | ICD-10-CM | POA: Insufficient documentation

## 2021-12-30 DIAGNOSIS — R42 Dizziness and giddiness: Secondary | ICD-10-CM | POA: Insufficient documentation

## 2021-12-30 DIAGNOSIS — R7989 Other specified abnormal findings of blood chemistry: Secondary | ICD-10-CM | POA: Insufficient documentation

## 2021-12-30 DIAGNOSIS — I5042 Chronic combined systolic (congestive) and diastolic (congestive) heart failure: Secondary | ICD-10-CM | POA: Diagnosis not present

## 2021-12-30 DIAGNOSIS — R0602 Shortness of breath: Secondary | ICD-10-CM | POA: Diagnosis present

## 2021-12-30 DIAGNOSIS — Z91010 Allergy to peanuts: Secondary | ICD-10-CM | POA: Diagnosis not present

## 2021-12-30 DIAGNOSIS — R519 Headache, unspecified: Secondary | ICD-10-CM | POA: Diagnosis not present

## 2021-12-30 LAB — CBC WITH DIFFERENTIAL/PLATELET
Abs Immature Granulocytes: 0 10*3/uL (ref 0.00–0.07)
Basophils Absolute: 0 10*3/uL (ref 0.0–0.1)
Basophils Relative: 1 %
Eosinophils Absolute: 0.3 10*3/uL (ref 0.0–0.5)
Eosinophils Relative: 5 %
HCT: 46.5 % (ref 39.0–52.0)
Hemoglobin: 15.3 g/dL (ref 13.0–17.0)
Immature Granulocytes: 0 %
Lymphocytes Relative: 39 %
Lymphs Abs: 2.1 10*3/uL (ref 0.7–4.0)
MCH: 26.4 pg (ref 26.0–34.0)
MCHC: 32.9 g/dL (ref 30.0–36.0)
MCV: 80.3 fL (ref 80.0–100.0)
Monocytes Absolute: 0.5 10*3/uL (ref 0.1–1.0)
Monocytes Relative: 9 %
Neutro Abs: 2.6 10*3/uL (ref 1.7–7.7)
Neutrophils Relative %: 46 %
Platelets: 190 10*3/uL (ref 150–400)
RBC: 5.79 MIL/uL (ref 4.22–5.81)
RDW: 16.4 % — ABNORMAL HIGH (ref 11.5–15.5)
Smear Review: NORMAL
WBC: 5.4 10*3/uL (ref 4.0–10.5)
nRBC: 0 % (ref 0.0–0.2)

## 2021-12-30 LAB — COMPREHENSIVE METABOLIC PANEL
ALT: 20 U/L (ref 0–44)
AST: 19 U/L (ref 15–41)
Albumin: 4.4 g/dL (ref 3.5–5.0)
Alkaline Phosphatase: 33 U/L — ABNORMAL LOW (ref 38–126)
Anion gap: 6 (ref 5–15)
BUN: 16 mg/dL (ref 6–20)
CO2: 27 mmol/L (ref 22–32)
Calcium: 9.1 mg/dL (ref 8.9–10.3)
Chloride: 106 mmol/L (ref 98–111)
Creatinine, Ser: 0.9 mg/dL (ref 0.61–1.24)
GFR, Estimated: >60 mL/min (ref >60)
Glucose, Bld: 115 mg/dL — ABNORMAL HIGH (ref 70–99)
Potassium: 3.4 mmol/L — ABNORMAL LOW (ref 3.5–5.1)
Sodium: 139 mmol/L (ref 135–145)
Total Bilirubin: 1.6 mg/dL — ABNORMAL HIGH (ref 0.3–1.2)
Total Protein: 7.9 g/dL (ref 6.5–8.1)

## 2021-12-30 LAB — BRAIN NATRIURETIC PEPTIDE: B Natriuretic Peptide: 1185.4 pg/mL — ABNORMAL HIGH (ref 0.0–100.0)

## 2021-12-30 LAB — TROPONIN I (HIGH SENSITIVITY)
Troponin I (High Sensitivity): 25 ng/L — ABNORMAL HIGH (ref <18)
Troponin I (High Sensitivity): 21 ng/L — ABNORMAL HIGH (ref <18)

## 2021-12-30 MED ORDER — ACETAMINOPHEN 325 MG PO TABS
650.0000 mg | ORAL_TABLET | Freq: Once | ORAL | Status: AC
  Administered 2021-12-30: 650 mg via ORAL
  Filled 2021-12-30: qty 2

## 2021-12-30 MED ORDER — FUROSEMIDE 10 MG/ML IJ SOLN
20.0000 mg | Freq: Once | INTRAMUSCULAR | Status: AC
  Administered 2021-12-30: 20 mg via INTRAVENOUS
  Filled 2021-12-30: qty 2

## 2021-12-30 NOTE — Discharge Instructions (Addendum)
Please read and follow all provided instructions.  Your diagnoses today include:  1. Shortness of breath    Tests performed today include: An EKG of your heart A chest x-ray: no sign of pneumonia or severe heart failure Cardiac enzymes - a blood test for heart muscle damage Blood counts and electrolytes Vital signs. See below for your results today.   Medications prescribed:  None  Take any prescribed medications only as directed.  Follow-up instructions: Please follow-up with your primary care provider as soon as you can for further evaluation of your symptoms.   Return instructions:  SEEK IMMEDIATE MEDICAL ATTENTION IF: You have severe chest pain, especially if the pain is crushing or pressure-like and spreads to the arms, back, neck, or jaw, or if you have sweating, nausea or vomiting, or trouble with breathing. THIS IS AN EMERGENCY. Do not wait to see if the pain will go away. Get medical help at once. Call 911. DO NOT drive yourself to the hospital.  Your chest pain gets worse and does not go away after a few minutes of rest.  You have an attack of chest pain lasting longer than what you usually experience.  You have significant dizziness, if you pass out, or have trouble walking.  You have chest pain not typical of your usual pain for which you originally saw your caregiver.  You have any other emergent concerns regarding your health.  Additional Information: Chest pain comes from many different causes. Your caregiver has diagnosed you as having chest pain that is not specific for one problem, but does not require admission.  You are at low risk for an acute heart condition or other serious illness.   Your vital signs today were: BP (!) 144/87 (BP Location: Left Arm)   Pulse 78   Temp 98.5 F (36.9 C) (Oral)   Resp 18   Ht 6' (1.829 m)   Wt 103.9 kg   SpO2 98%   BMI 31.06 kg/m  If your blood pressure (BP) was elevated above 135/85 this visit, please have this repeated  by your doctor within one month. --------------

## 2021-12-30 NOTE — ED Triage Notes (Signed)
C/O shortness of breath, worst with activity. Reported he took some Lasix when he got home from work, as instructed by PCP;

## 2021-12-30 NOTE — ED Provider Notes (Signed)
[facility] EMERGENCY DEPARTMENT Provider Note   CSN: [id number] Arrival date & time: 12/30/21  1550     History  Chief Complaint  Patient presents with   Shortness of Breath    Jeffery Glenn is a 51 y.o. male.  Patient with history of nonischemic cardiomyopathy with ejection fraction 25 to 30% s/p ICD, chronic combined congestive heart failure, hypertension, dyspnea on exertion, previous history of syncope and lightheadedness due to overdiuresis --presents to the emergency department today for shortness of breath.  Patient states that he works third shift, 12-hour shifts.  While at work last night developed increasing shortness of breath.  States that last evening he did have pizza and during his shift last night was drinking more water than usual.  He states that symptoms progressed to the point where he needed to stop every few feet while walking due to shortness of breath.  He took furosemide which he takes on an as-needed basis.  This caused him to urinate and helped his breathing improved.  He went home and slept for several hours.  Upon waking up he did have some dizziness which he describes as lightheadedness without full syncope.  Breathing was better.  He had a frontal headache as well.  No strokelike symptoms.  No chest pain.  He endorses mild lower extremity swelling.       Home Medications Prior to Admission medications   Medication Sig Start Date End Date Taking? Authorizing Provider  atorvastatin (LIPITOR) 20 MG tablet Take 1 tablet (20 mg total) by mouth daily. 03/05/20   Georgeanna Lea, MD  carvedilol (COREG) 25 MG tablet Take 1 tablet (25 mg total) by mouth 2 (two) times daily with a meal. 05/06/21   Georgeanna Lea, MD  furosemide (LASIX) 40 MG tablet Take 1 tablet (40 mg total) by mouth daily. 05/06/21   Georgeanna Lea, MD  sacubitril-valsartan (ENTRESTO) 97-103 MG Take 1 tablet by mouth 2 (two) times daily. 10/13/21   Dunn, Tacey Ruiz, PA-C       Allergies    Shellfish allergy and Peanut-containing drug products    Review of Systems   Review of Systems  Physical Exam Updated Vital Signs BP (!) 153/101   Pulse 70   Temp 98 F (36.7 C) (Oral)   Resp (!) 30   Ht 6' (1.829 m)   Wt 103.9 kg   SpO2 97%   BMI 31.06 kg/m   Physical Exam Vitals and nursing note reviewed.  Constitutional:      Appearance: He is well-developed. He is not diaphoretic.  HENT:     Head: Normocephalic and atraumatic.     Mouth/Throat:     Mouth: Mucous membranes are not dry.  Eyes:     Conjunctiva/sclera: Conjunctivae normal.  Neck:     Vascular: Normal carotid pulses. No carotid bruit or JVD.     Trachea: Trachea normal. No tracheal deviation.  Cardiovascular:     Rate and Rhythm: Normal rate and regular rhythm.     Pulses: No decreased pulses.          Radial pulses are 2+ on the right side and 2+ on the left side.     Heart sounds: Normal heart sounds, S1 normal and S2 normal. Heart sounds not distant. No murmur heard. Pulmonary:     Effort: Pulmonary effort is normal. No respiratory distress.     Breath sounds: Decreased breath sounds (Generally diminished breath sounds) present. No wheezing, rhonchi or rales.  Chest:     Chest wall: No tenderness.  Abdominal:     General: Bowel sounds are normal.     Palpations: Abdomen is soft.     Tenderness: There is no abdominal tenderness. There is no guarding or rebound.  Musculoskeletal:     Cervical back: Normal range of motion and neck supple. No muscular tenderness.     Right lower leg: Edema present.     Left lower leg: Edema present.     Comments: Trace symmetric bilateral lower extremity edema without pitting  Skin:    General: Skin is warm and dry.     Coloration: Skin is not pale.  Neurological:     Mental Status: He is alert. Mental status is at baseline.  Psychiatric:        Mood and Affect: Mood normal.     ED Results / Procedures / Treatments   Labs (all labs ordered  are listed, but only abnormal results are displayed) Labs Reviewed  CBC WITH DIFFERENTIAL/PLATELET - Abnormal; Notable for the following components:      Result Value   RDW 16.4 (*)    All other components within normal limits  COMPREHENSIVE METABOLIC PANEL - Abnormal; Notable for the following components:   Potassium 3.4 (*)    Glucose, Bld 115 (*)    Alkaline Phosphatase 33 (*)    Total Bilirubin 1.6 (*)    All other components within normal limits  BRAIN NATRIURETIC PEPTIDE - Abnormal; Notable for the following components:   B Natriuretic Peptide 1,185.4 (*)    All other components within normal limits  TROPONIN I (HIGH SENSITIVITY) - Abnormal; Notable for the following components:   Troponin I (High Sensitivity) 25 (*)    All other components within normal limits  TROPONIN I (HIGH SENSITIVITY) - Abnormal; Notable for the following components:   Troponin I (High Sensitivity) 21 (*)    All other components within normal limits    EKG EKG Interpretation  Date/Time:  Tuesday December 30 2021 16:14:41 EDT Ventricular Rate:  70 PR Interval:  184 QRS Duration: 100 QT Interval:  454 QTC Calculation: 490 R Axis:   246 Text Interpretation: Normal sinus rhythm Biatrial enlargement Right superior axis deviation Minimal voltage criteria for LVH, may be normal variant ( Cornell product ) Possible Anterior infarct , age undetermined T wave abnormality, consider lateral ischemia Abnormal ECG When compared with ECG of 25-Aug-2021 18:28, PREVIOUS ECG IS PRESENT No significant change was found Confirmed by Ezequiel Essex [phone]) on 12/30/2021 4:18:51 PM  Radiology DG Chest 2 View  Result Date: 12/30/2021 CLINICAL DATA:  Provided history: Shortness of breath. EXAM: CHEST - 2 VIEW COMPARISON:  CTA chest 08/25/2021. Prior chest radiographs 08/25/2021 and earlier. FINDINGS: Redemonstrated left chest single-lead cardiac device. Cardiomegaly, unchanged. No appreciable airspace consolidation or  pulmonary edema. No evidence of pleural effusion or pneumothorax. No acute bony abnormality identified. IMPRESSION: No evidence of acute cardiopulmonary abnormality. Cardiomegaly. Electronically Signed   By: Kellie Simmering D.O.   On: 12/30/2021 16:29    Procedures Procedures    Medications Ordered in ED Medications  furosemide (LASIX) injection 20 mg (20 mg Intravenous Given 12/30/21 1901)  acetaminophen (TYLENOL) tablet 650 mg (650 mg Oral Given 12/30/21 1941)    ED Course/ Medical Decision Making/ A&P    Patient seen and examined. History obtained directly from patient. Work-up including labs, imaging, EKG ordered in triage, if performed, were reviewed.    Labs/EKG: Independently reviewed and interpreted.  This included:  CBC unremarkable; CMP potassium 3.4, creatinine 0.9, otherwise unremarkable; BNP 1185 higher than typical baseline; troponin 25>21 likely related to CHF.  EKG without significant changes.  Imaging: Independently visualized and interpreted.  This included: Chest x-ray, no pulmonary edema  Medications/Fluids: Ordered: 20 mg IV Lasix given elevated BNP and recent symptoms, which seem to be improving.  Patient looks very well.  He is not tachypneic on my exam at least.  Most recent vital signs reviewed and are as follows: BP (!) 153/101   Pulse 70   Temp 98 F (36.7 C) (Oral)   Resp (!) 30   Ht 6' (1.829 m)   Wt 103.9 kg   SpO2 97%   BMI 31.06 kg/m   Initial impression: Dose of IV Lasix, ambulate and check pulse ox, Tylenol for headache.  Given his well appearance, I anticipate that he will be able to be discharged home if he continues to do well.  8:25 PM Reassessment performed. Patient appears very well.  Ambulated with out hypoxia.  Family member at bedside.  Patient has been up to the restroom 3 times to urinate after 20 of Lasix.  Blood pressure is still good.  Reviewed pertinent lab work and imaging with patient at bedside. Questions answered.   Most  current vital signs reviewed and are as follows: BP (!) 144/87 (BP Location: Left Arm)   Pulse 78   Temp 98.5 F (36.9 C) (Oral)   Resp 18   Ht 6' (1.829 m)   Wt 103.9 kg   SpO2 98%   BMI 31.06 kg/m   Plan: Discharge to home.   Prescriptions written for: None  Other home care instructions discussed: Rest  ED return instructions discussed: Worsening shortness of breath, chest pain, fevers, cough.  Follow-up instructions discussed: Patient encouraged to follow-up with their PCP in 3 days.                              Medical Decision Making Amount and/or Complexity of Data Reviewed Labs: ordered. Radiology: ordered.  Risk OTC drugs. Prescription drug management.   Patient with shortness of breath in setting of heart failure.  Patient treated himself at home today with diuretics.  Overall felt better but was encouraged to come for check by PCP.  On exam here, lungs are clear.  BNP is elevated from baseline, however patient has reassuring vital signs and is ambulatory without hypoxia.  He does have a headache but reassuring neuro exam.  Low concern for intracranial bleeding, hemorrhagic stroke or otherwise.  No concern for ACS, PE.  Likely mild heart failure exacerbation, improved during the course of the day today.  Patient stable for discharge to home on regular medications.  The patient's vital signs, pertinent lab work and imaging were reviewed and interpreted as discussed in the ED course. Hospitalization was considered for further testing, treatments, or serial exams/observation. However as patient is well-appearing, has a stable exam, and reassuring studies today, I do not feel that they warrant admission at this time. This plan was discussed with the patient who verbalizes agreement and comfort with this plan and seems reliable and able to return to the Emergency Department with worsening or changing symptoms.          Final Clinical Impression(s) / ED  Diagnoses Final diagnoses:  Shortness of breath    Rx / DC Orders ED Discharge Orders     None  Carlisle Cater, PA-C 12/30/21 2026    Ezequiel Essex, MD 12/31/21 0130

## 2021-12-30 NOTE — ED Notes (Signed)
SATURATION QUALIFICATIONS: (This note is used to comply with regulatory documentation for home oxygen)  Patient Saturations on Room Air at Rest = 98%  Patient Saturations on Room Air while Ambulating = 97%  Patient Saturations on 0 Liters of oxygen while Ambulating = 97%

## 2022-01-14 ENCOUNTER — Telehealth: Payer: Self-pay | Admitting: Adult Health

## 2022-02-23 ENCOUNTER — Ambulatory Visit (INDEPENDENT_AMBULATORY_CARE_PROVIDER_SITE_OTHER): Payer: Medicaid Other

## 2022-02-23 DIAGNOSIS — I420 Dilated cardiomyopathy: Secondary | ICD-10-CM

## 2022-02-24 LAB — CUP PACEART REMOTE DEVICE CHECK
Battery Remaining Percentage: 86 %
Date Time Interrogation Session: 20231218155300
Implantable Lead Connection Status: 753985
Implantable Lead Implant Date: 20220902
Implantable Lead Location: 753860
Implantable Lead Model: 3501
Implantable Lead Serial Number: 225042
Implantable Pulse Generator Implant Date: 20220902
Pulse Gen Serial Number: 166628

## 2022-02-25 NOTE — Telephone Encounter (Signed)
Noted. Will close this encounter.  

## 2022-03-09 ENCOUNTER — Emergency Department (HOSPITAL_BASED_OUTPATIENT_CLINIC_OR_DEPARTMENT_OTHER): Payer: Medicaid Other

## 2022-03-09 ENCOUNTER — Encounter (HOSPITAL_BASED_OUTPATIENT_CLINIC_OR_DEPARTMENT_OTHER): Payer: Self-pay

## 2022-03-09 ENCOUNTER — Emergency Department (HOSPITAL_BASED_OUTPATIENT_CLINIC_OR_DEPARTMENT_OTHER)
Admission: EM | Admit: 2022-03-09 | Discharge: 2022-03-09 | Disposition: A | Discharge status: Home / Self Care | Payer: Medicaid Other | Attending: Emergency Medicine | Admitting: Emergency Medicine

## 2022-03-09 ENCOUNTER — Other Ambulatory Visit: Payer: Self-pay

## 2022-03-09 DIAGNOSIS — I110 Hypertensive heart disease with heart failure: Secondary | ICD-10-CM | POA: Insufficient documentation

## 2022-03-09 DIAGNOSIS — I5030 Unspecified diastolic (congestive) heart failure: Secondary | ICD-10-CM | POA: Insufficient documentation

## 2022-03-09 DIAGNOSIS — R0602 Shortness of breath: Secondary | ICD-10-CM | POA: Insufficient documentation

## 2022-03-09 DIAGNOSIS — M791 Myalgia, unspecified site: Secondary | ICD-10-CM | POA: Diagnosis present

## 2022-03-09 DIAGNOSIS — Z91010 Allergy to peanuts: Secondary | ICD-10-CM | POA: Insufficient documentation

## 2022-03-09 DIAGNOSIS — U071 COVID-19: Secondary | ICD-10-CM | POA: Diagnosis not present

## 2022-03-09 LAB — PROCALCITONIN: Procalcitonin: <0.1 ng/mL

## 2022-03-09 LAB — CBC WITH DIFFERENTIAL/PLATELET
Abs Immature Granulocytes: 0.02 K/uL (ref 0.00–0.07)
Basophils Absolute: 0 K/uL (ref 0.0–0.1)
Basophils Relative: 0 %
Eosinophils Absolute: 0.3 K/uL (ref 0.0–0.5)
Eosinophils Relative: 4 %
HCT: 42.3 % (ref 39.0–52.0)
Hemoglobin: 13.8 g/dL (ref 13.0–17.0)
Immature Granulocytes: 0 %
Lymphocytes Relative: 14 %
Lymphs Abs: 0.9 K/uL (ref 0.7–4.0)
MCH: 27.1 pg (ref 26.0–34.0)
MCHC: 32.6 g/dL (ref 30.0–36.0)
MCV: 82.9 fL (ref 80.0–100.0)
Monocytes Absolute: 0.8 K/uL (ref 0.1–1.0)
Monocytes Relative: 11 %
Neutro Abs: 5 K/uL (ref 1.7–7.7)
Neutrophils Relative %: 71 %
Platelets: 175 K/uL (ref 150–400)
RBC: 5.1 MIL/uL (ref 4.22–5.81)
RDW: 15.7 % — ABNORMAL HIGH (ref 11.5–15.5)
Smear Review: NORMAL
WBC: 7 K/uL (ref 4.0–10.5)
nRBC: 0 % (ref 0.0–0.2)

## 2022-03-09 LAB — I-STAT VENOUS BLOOD GAS, ED
Acid-Base Excess: 3 mmol/L — ABNORMAL HIGH (ref 0.0–2.0)
Bicarbonate: 28.3 mmol/L — ABNORMAL HIGH (ref 20.0–28.0)
Calcium, Ion: 1.11 mmol/L — ABNORMAL LOW (ref 1.15–1.40)
HCT: 46 % (ref 39.0–52.0)
Hemoglobin: 15.6 g/dL (ref 13.0–17.0)
O2 Saturation: 73 %
Patient temperature: 99.5
Potassium: 4 mmol/L (ref 3.5–5.1)
Sodium: 139 mmol/L (ref 135–145)
TCO2: 30 mmol/L (ref 22–32)
pCO2, Ven: 46.9 mmHg (ref 44–60)
pH, Ven: 7.39 (ref 7.25–7.43)
pO2, Ven: 41 mmHg (ref 32–45)

## 2022-03-09 LAB — RESP PANEL BY RT-PCR (RSV, FLU A&B, COVID)  RVPGX2
Influenza A by PCR: NEGATIVE
Influenza B by PCR: NEGATIVE
Resp Syncytial Virus by PCR: NEGATIVE
SARS Coronavirus 2 by RT PCR: POSITIVE — AB

## 2022-03-09 LAB — TROPONIN I (HIGH SENSITIVITY)
Troponin I (High Sensitivity): 47 ng/L — ABNORMAL HIGH
Troponin I (High Sensitivity): 50 ng/L — ABNORMAL HIGH (ref <18)

## 2022-03-09 LAB — COMPREHENSIVE METABOLIC PANEL WITH GFR
ALT: 12 U/L (ref 0–44)
AST: 18 U/L (ref 15–41)
Albumin: 3.9 g/dL (ref 3.5–5.0)
Alkaline Phosphatase: 35 U/L — ABNORMAL LOW (ref 38–126)
Anion gap: 9 (ref 5–15)
BUN: 11 mg/dL (ref 6–20)
CO2: 26 mmol/L (ref 22–32)
Calcium: 8.5 mg/dL — ABNORMAL LOW (ref 8.9–10.3)
Chloride: 101 mmol/L (ref 98–111)
Creatinine, Ser: 1.04 mg/dL (ref 0.61–1.24)
GFR, Estimated: >60 mL/min
Glucose, Bld: 102 mg/dL — ABNORMAL HIGH (ref 70–99)
Potassium: 3.9 mmol/L (ref 3.5–5.1)
Sodium: 136 mmol/L (ref 135–145)
Total Bilirubin: 1.1 mg/dL (ref 0.3–1.2)
Total Protein: 7.8 g/dL (ref 6.5–8.1)

## 2022-03-09 LAB — BRAIN NATRIURETIC PEPTIDE: B Natriuretic Peptide: 989.6 pg/mL — ABNORMAL HIGH (ref 0.0–100.0)

## 2022-03-09 LAB — C-REACTIVE PROTEIN: CRP: 1.5 mg/dL — ABNORMAL HIGH (ref <1.0)

## 2022-03-09 LAB — LACTIC ACID, PLASMA: Lactic Acid, Venous: 1.1 mmol/L (ref 0.5–1.9)

## 2022-03-09 MED ORDER — ACETAMINOPHEN 325 MG PO TABS
650.0000 mg | ORAL_TABLET | Freq: Once | ORAL | Status: AC
  Administered 2022-03-09: 650 mg via ORAL
  Filled 2022-03-09: qty 2

## 2022-03-09 MED ORDER — SODIUM CHLORIDE 0.9 % IV BOLUS
250.0000 mL | Freq: Once | INTRAVENOUS | Status: AC
  Administered 2022-03-09: 250 mL via INTRAVENOUS

## 2022-03-09 MED ORDER — IOHEXOL 350 MG/ML SOLN
100.0000 mL | Freq: Once | INTRAVENOUS | Status: AC | PRN
  Administered 2022-03-09: 100 mL via INTRAVENOUS

## 2022-03-09 MED ORDER — MOLNUPIRAVIR EUA 200MG CAPSULE: 4.0000 | ORAL_CAPSULE | Freq: Two times a day (BID) | ORAL | 0 refills | Status: AC

## 2022-03-09 NOTE — ED Triage Notes (Signed)
Pt reports generalized body aches x 2 days associated with shortness of breath and one episode of chest pain earlier this evening when he was drinking water. He reports subjective fever at home.

## 2022-03-09 NOTE — ED Provider Notes (Signed)
[facility] Provider Note   CSN: [id number] Arrival date & time: 03/09/22  1949     History  Chief Complaint  Patient presents with   Generalized Body Aches    Jeffery Glenn is a 52 y.o. male with a past medical history of diastolic heart failure with an EF less than 20% ICD placed, hypertension who presents to the emergency department with flulike illness and shortness of breath.  He states that his symptoms began yesterday with tactile fever, body aches, chills.  He has severe shortness of breath both with exertion and at rest.  He states that he does not feel like he is fluid overloaded.  He is notably tachycardic and states that he did take his regular blood pressure medications today including carvedilol.  He denies hemoptysis, peripheral edema, abdominal swelling.  No known sick contacts.  The history is provided by the patient, a relative and medical records.       Home Medications Prior to Admission medications   Medication Sig Start Date End Date Taking? Authorizing Provider  molnupiravir EUA (LAGEVRIO) 200 mg CAPS capsule Take 4 capsules (800 mg total) by mouth 2 (two) times daily for 5 days. 03/09/22 03/14/22 Yes Varnell Orvis, PA-C  atorvastatin (LIPITOR) 20 MG tablet Take 1 tablet (20 mg total) by mouth daily. 03/05/20   Park Liter, MD  carvedilol (COREG) 25 MG tablet Take 1 tablet (25 mg total) by mouth 2 (two) times daily with a meal. 05/06/21   Park Liter, MD  furosemide (LASIX) 40 MG tablet Take 1 tablet (40 mg total) by mouth daily. 05/06/21   Park Liter, MD  sacubitril-valsartan (ENTRESTO) 97-103 MG Take 1 tablet by mouth 2 (two) times daily. 10/13/21   Dunn, Nedra Hai, PA-C      Allergies    Shellfish allergy and Peanut-containing drug products    Review of Systems   Review of Systems  Physical Exam Updated Vital Signs BP (!) 173/127   Pulse (!) 113   Temp 99 F (37.2 C) (Oral)   Resp (!) 36   Ht 6' (1.829  m)   Wt 106.6 kg   SpO2 95%   BMI 31.87 kg/m  Physical Exam Constitutional:      Appearance: He is ill-appearing. He is not toxic-appearing.  HENT:     Head: Normocephalic.     Nose: Congestion present.     Mouth/Throat:     Mouth: Mucous membranes are dry.  Eyes:     Extraocular Movements: Extraocular movements intact.     Pupils: Pupils are equal, round, and reactive to light.  Neck:     Comments: No jvd  Cardiovascular:     Rate and Rhythm: Tachycardia present.     Heart sounds:     Gallop present.  Pulmonary:     Effort: Tachypnea present.     Breath sounds: Decreased breath sounds present.  Musculoskeletal:     Right lower leg: No edema.     Left lower leg: No edema.  Neurological:     Mental Status: He is alert.     ED Results / Procedures / Treatments   Labs (all labs ordered are listed, but only abnormal results are displayed) Labs Reviewed  RESP PANEL BY RT-PCR (RSV, FLU A&B, COVID)  RVPGX2 - Abnormal; Notable for the following components:      Result Value   SARS Coronavirus 2 by RT PCR POSITIVE (*)    All other components within  normal limits  CBC WITH DIFFERENTIAL/PLATELET - Abnormal; Notable for the following components:   RDW 15.7 (*)    All other components within normal limits  BRAIN NATRIURETIC PEPTIDE - Abnormal; Notable for the following components:   B Natriuretic Peptide 989.6 (*)    All other components within normal limits  COMPREHENSIVE METABOLIC PANEL - Abnormal; Notable for the following components:   Glucose, Bld 102 (*)    Calcium 8.5 (*)    Alkaline Phosphatase 35 (*)    All other components within normal limits  C-REACTIVE PROTEIN - Abnormal; Notable for the following components:   CRP 1.5 (*)    All other components within normal limits  I-STAT VENOUS BLOOD GAS, ED - Abnormal; Notable for the following components:   Bicarbonate 28.3 (*)    Acid-Base Excess 3.0 (*)    Calcium, Ion 1.11 (*)    All other components within normal  limits  TROPONIN I (HIGH SENSITIVITY) - Abnormal; Notable for the following components:   Troponin I (High Sensitivity) 47 (*)    All other components within normal limits  TROPONIN I (HIGH SENSITIVITY) - Abnormal; Notable for the following components:   Troponin I (High Sensitivity) 50 (*)    All other components within normal limits  CULTURE, BLOOD (ROUTINE X 2)  CULTURE, BLOOD (ROUTINE X 2)  LACTIC ACID, PLASMA  PROCALCITONIN    EKG EKG Interpretation  Date/Time:  Monday March 09 2022 20:03:35 EST Ventricular Rate:  116 PR Interval:  176 QRS Duration: 95 QT Interval:  323 QTC Calculation: 449 R Axis:   -86 Text Interpretation: Sinus tachycardia Left anterior fascicular block Left ventricular hypertrophy Non-specific ST-t changes Confirmed by Lajean Saver [phone]) on 03/09/2022 8:31:31 PM  Radiology CT Angio Chest PE W and/or Wo Contrast  Result Date: 03/09/2022 CLINICAL DATA:  Pulmonary embolism suspected. EXAM: CT ANGIOGRAPHY CHEST WITH CONTRAST TECHNIQUE: Multidetector CT imaging of the chest was performed using the standard protocol during bolus administration of intravenous contrast. Multiplanar CT image reconstructions and MIPs were obtained to evaluate the vascular anatomy. RADIATION DOSE REDUCTION: This exam was performed according to the departmental dose-optimization program which includes automated exposure control, adjustment of the mA and/or kV according to patient size and/or use of iterative reconstruction technique. CONTRAST:  153mL OMNIPAQUE IOHEXOL 350 MG/ML SOLN COMPARISON:  None Available. FINDINGS: Cardiovascular: Satisfactory opacification of the pulmonary arteries to the segmental level. No evidence of pulmonary embolism. The heart is enlarged. No pericardial effusion. Mediastinum/Nodes: No enlarged mediastinal, hilar, or axillary lymph nodes. Thyroid gland, trachea, and esophagus demonstrate no significant findings. Lungs/Pleura: Lungs are clear without evidence  of focal consolidation or pleural effusion. Bibasilar subsegmental linear atelectasis. Evaluation of lung parenchyma is limited due to respiratory motion. No suspicious pulmonary nodule. Upper Abdomen: No acute abnormality. Musculoskeletal: No chest wall abnormality. No acute or significant osseous findings. Electronic device in the left anterior chest wall. Review of the MIP images confirms the above findings. IMPRESSION: 1. No evidence of pulmonary embolism or other acute intrathoracic process. 2. Cardiomegaly. Electronically Signed   By: Keane Police D.O.   On: 03/09/2022 22:00   DG Chest Port 1 View  Result Date: 03/09/2022 CLINICAL DATA:  Shortness of breath EXAM: PORTABLE CHEST 1 VIEW COMPARISON:  12/30/2021 FINDINGS: Cardiomegaly with linear atelectasis or scar at the left mid lung. Mild central congestion. Subsegmental atelectasis left base. Similar left-sided pacing device. IMPRESSION: Cardiomegaly with mild central congestion. Subsegmental atelectasis left base. Electronically Signed   By: Donavan Foil  M.D.   On: 03/09/2022 20:39    Procedures Procedures    Medications Ordered in ED Medications  acetaminophen (TYLENOL) tablet 650 mg (650 mg Oral Given 03/09/22 2043)  sodium chloride 0.9 % bolus 250 mL (0 mLs Intravenous Stopped 03/09/22 2206)  iohexol (OMNIPAQUE) 350 MG/ML injection 100 mL (100 mLs Intravenous Contrast Given 03/09/22 2143)    ED Course/ Medical Decision Making/ A&P Clinical Course as of 03/10/22 0954  Mon Mar 09, 2022  2310 Pending delta trop [MK]    Clinical Course User Index [MK] Kommor, Wyn Forster, MD                           Medical Decision Making This patient presents to the ED for concern of sob and chest pressure with flu like sxs, this involves an extensive number of treatment options, and is a complaint that carries with it a high risk of complications and morbidity.  The emergent differential diagnosis for shortness of breath includes, but is not limited to,  Pulmonary edema, bronchoconstriction, Pneumonia, Pulmonary embolism, Pneumothorax/ Hemothorax, Dysrhythmia, ACS.        Co morbidities that complicate the patient evaluation       HFrEF <20 % s/p AICD, HTN, Asthma, Cardiomyopathy   Additional history obtained:  {Additional history obtained from emr, review of echocardiogram notes, previous images    Lab Tests:  I Ordered, and personally interpreted labs.  The pertinent results include:   Troponin flat x2 - mildly elevated above baseline likely due to demand in the setting of fever and tachycardia + Covid 19 BNP below previous values and patient appears dry (vs improved with mild gentle fluid resuscitation) Lactate, Procalcitonin, vbg, cbc all wnl     Imaging Studies ordered:  I ordered imaging studies including CXR and CTA Chest I independently visualized and interpreted imaging which showed no infiltrates, edema or PE I agree with the radiologist interpretation   Cardiac Monitoring/ECG:       The patient was maintained on a cardiac monitor.  I personally viewed and interpreted the cardiac monitored which showed an underlying rhythm of: sinus tach at 116, Bi atrial enlargement and LVH      Medicines ordered and prescription drug management:  I ordered medication including Medications acetaminophen (TYLENOL) tablet 650 mg (650 mg Oral Given 03/09/22 2043) sodium chloride 0.9 % bolus 250 mL (0 mLs Intravenous Stopped 03/09/22 2206) iohexol (OMNIPAQUE) 350 MG/ML injection 100 mL (100 mLs Intravenous Contrast Given 03/09/22 2143) for Fever, tachypnea, and covid 19 Reevaluation of the patient after these medicines showed that the patient improved I have reviewed the patients home medicines and have made adjustments as needed   Test Considered:          Critical Interventions:       mild fluid resucitation   Consultations Obtained:      Problem List / ED Course:       (U07.1) COVID-19 virus infection   (primary encounter diagnosis)     Reevaluation:  After the interventions noted above, I reevaluated the patient and found that they have :improved   Social Determinants of Health:       good social support, strong op f/u   Disposition:  After consideration of the diagnostic results and the patients response to treatment, I feel that the patient can be discharged. He has no hypoxia at rest or with ambulation. Upon my assessment - on cardiac monitor and reevaluation patients RR was  20-22 and did not increase until discharge. His imaging showed no evidence of multifocal infiltrates consistent w/ viral pneumonia or edema. Patient is clearly of higher risk profile due to comorbidities, however I believe has social support and insight to return for worsening condition. He also has an extensive OP medical team. He will be discharged with molnupiravir and strict return precautions.    Problems Addressed: COVID-19 virus infection: complicated acute illness or injury  Amount and/or Complexity of Data Reviewed Labs: ordered. Radiology: ordered.  Risk OTC drugs. Prescription drug management.           Final Clinical Impression(s) / ED Diagnoses Final diagnoses:  COVID-19 virus infection    Rx / DC Orders ED Discharge Orders          Ordered    molnupiravir EUA (LAGEVRIO) 200 mg CAPS capsule  2 times daily        03/09/22 2332              Arthor Captain, PA-C 03/10/22 1002    Cathren Laine, MD 03/13/22 [phone]

## 2022-03-09 NOTE — ED Notes (Signed)
Pt ambulated around pt room appx 30 feet.  O2 sats maintained between 94-96% on RA. Pt denies increased work of breathing while walking.

## 2022-03-09 NOTE — ED Notes (Signed)
Pt denies CP at this time. Reports using home inhaler throughout the day with minimal relief.

## 2022-03-09 NOTE — Discharge Instructions (Addendum)
Get help right away if: You have trouble breathing. You have pain or pressure in your chest. You are confused. You have bluish lips and fingernails. You have trouble waking from sleep. You have symptoms that get worse. These symptoms may be an emergency. Get help right away. Call 911. Do not wait to see if the symptoms will go away. Do not drive yourself to the hospital. 

## 2022-03-09 NOTE — ED Notes (Signed)
ED Provider at bedside. 

## 2022-03-15 LAB — CULTURE, BLOOD (ROUTINE X 2)
Culture: NO GROWTH
Culture: NO GROWTH
Special Requests: ADEQUATE

## 2022-03-18 ENCOUNTER — Ambulatory Visit: Payer: Medicaid Other | Admitting: Cardiology

## 2022-03-26 ENCOUNTER — Telehealth: Payer: Self-pay | Admitting: Adult Health

## 2022-03-26 ENCOUNTER — Ambulatory Visit: Payer: Medicaid Other | Admitting: Adult Health

## 2022-03-26 DIAGNOSIS — G4733 Obstructive sleep apnea (adult) (pediatric): Secondary | ICD-10-CM

## 2022-03-26 DIAGNOSIS — R0683 Snoring: Secondary | ICD-10-CM

## 2022-03-26 NOTE — Telephone Encounter (Signed)
PT's daughter is here to get a home sleep study for this PT. I do not see one in the file here at the desk. Please advise. TY

## 2022-03-27 NOTE — Telephone Encounter (Signed)
HST machine was picked up 1/18. Closing encounter.

## 2022-03-30 NOTE — Progress Notes (Signed)
Remote ICD transmission.   

## 2022-04-08 DIAGNOSIS — G4733 Obstructive sleep apnea (adult) (pediatric): Secondary | ICD-10-CM | POA: Diagnosis not present

## 2022-04-14 ENCOUNTER — Encounter: Payer: Self-pay | Admitting: Cardiology

## 2022-04-14 ENCOUNTER — Telehealth: Payer: Self-pay | Admitting: Adult Health

## 2022-04-14 ENCOUNTER — Ambulatory Visit: Payer: Medicaid Other | Attending: Cardiology | Admitting: Cardiology

## 2022-04-14 VITALS — BP 130/78 | HR 70 | Ht 72.0 in | Wt 237.0 lb

## 2022-04-14 DIAGNOSIS — R0683 Snoring: Secondary | ICD-10-CM

## 2022-04-14 DIAGNOSIS — I5042 Chronic combined systolic (congestive) and diastolic (congestive) heart failure: Secondary | ICD-10-CM

## 2022-04-14 DIAGNOSIS — Z95810 Presence of automatic (implantable) cardiac defibrillator: Secondary | ICD-10-CM

## 2022-04-14 DIAGNOSIS — I10 Essential (primary) hypertension: Secondary | ICD-10-CM | POA: Diagnosis not present

## 2022-04-14 DIAGNOSIS — G4733 Obstructive sleep apnea (adult) (pediatric): Secondary | ICD-10-CM

## 2022-04-14 DIAGNOSIS — I428 Other cardiomyopathies: Secondary | ICD-10-CM

## 2022-04-14 NOTE — Progress Notes (Signed)
ATC x1.  LVM to return call. 

## 2022-04-14 NOTE — Progress Notes (Signed)
Cardiology Office Note:    Date:  04/14/2022   ID:  Jeffery Glenn, DOB 21-Jul-1970, MRN [id number]  PCP:  Jeffery Sailors, PA  Cardiologist:  Jeffery Campus, MD    Referring MD: [facility]   Chief Complaint  Patient presents with   Follow-up    History of Present Illness:    Jeffery Glenn is a 52 y.o. male with past medical history significant for nonischemic cardiomyopathy severely reduced left ventricle ejection fraction, essential hypertension, obstructive sleep apnea, ICD present.  He comes today to months for follow-up.  Recently had a being sick with COVID-19 infection.  He said it was very bad but now recovering he is back at work and doing well.  Biggest complaint he have right now is sinus problem.  Denies have any chest pain tightness squeezing pressure burning chest.  Past Medical History:  Diagnosis Date   Dyspnea on exertion 05/17/2018   Essential hypertension 05/17/2018   HFrEF (heart failure with reduced ejection fraction) ([place])    Hypertension    ICD (implantable cardioverter-defibrillator) in place    Mitral regurgitation    NICM (nonischemic cardiomyopathy) ([facility] [place])     Past Surgical History:  Procedure Laterality Date   HAND SURGERY     NASAL SEPTUM SURGERY     SUBQ ICD IMPLANT N/A 11/08/2020   Procedure: SUBQ ICD IMPLANT;  Surgeon: Constance Haw, MD;  Location: [facility];  Service: Cardiovascular;  Laterality: N/A;    Current Medications: Current Meds  Medication Sig   atorvastatin (LIPITOR) 20 MG tablet Take 1 tablet (20 mg total) by mouth daily.   carvedilol (COREG) 25 MG tablet Take 1 tablet (25 mg total) by mouth 2 (two) times daily with a meal.   furosemide (LASIX) 40 MG tablet Take 1 tablet (40 mg total) by mouth daily.   sacubitril-valsartan (ENTRESTO) 97-103 MG Take 1 tablet by mouth 2 (two) times daily.     Allergies:   Shellfish allergy and Peanut-containing drug products   Social History   Socioeconomic History    Marital status: Married    Spouse name: Not on file   Number of children: Not on file   Years of education: Not on file   Highest education level: Not on file  Occupational History   Not on file  Tobacco Use   Smoking status: Never    Passive exposure: Past   Smokeless tobacco: Never  Vaping Use   Vaping Use: Never used  Substance and Sexual Activity   Alcohol use: Not Currently   Drug use: No   Sexual activity: Not on file  Other Topics Concern   Not on file  Social History Narrative   Not on file   Social Determinants of Health   Financial Resource Strain: Not on file  Food Insecurity: Not on file  Transportation Needs: Not on file  Physical Activity: Not on file  Stress: Not on file  Social Connections: Not on file     Family History: The patient's family history includes Heart attack in his father and mother; Hypertension in his father and mother. ROS:   Please see the history of present illness.    All 14 point review of systems negative except as described per history of present illness  EKGs/Labs/Other Studies Reviewed:      Recent Labs: 08/26/2021: Magnesium 2.1 03/09/2022: ALT 12; B Natriuretic Peptide 989.6; BUN 11; Creatinine, Ser 1.04; Hemoglobin 15.6; Platelets 175; Potassium 4.0; Sodium 139  Recent  Lipid Panel    Component Value Date/Time   CHOL 184 01/31/2008 2114   TRIG 62 01/31/2008 2114   HDL 52 01/31/2008 2114   CHOLHDL 3.5 Ratio 01/31/2008 2114   VLDL 12 01/31/2008 2114   LDLCALC 120 (H) 01/31/2008 2114    Physical Exam:    VS:  BP 130/78 (BP Location: Left Arm, Patient Position: Sitting)   Pulse 70   Ht 6' (1.829 m)   Wt 237 lb (107.5 kg)   SpO2 98%   BMI 32.14 kg/m     Wt Readings from Last 3 Encounters:  04/14/22 237 lb (107.5 kg)  03/09/22 235 lb (106.6 kg)  12/30/21 229 lb (103.9 kg)     GEN:  Well nourished, well developed in no acute distress HEENT: Normal NECK: No JVD; No carotid bruits LYMPHATICS: No  lymphadenopathy CARDIAC: RRR, no murmurs, no rubs, no gallops RESPIRATORY:  Clear to auscultation without rales, wheezing or rhonchi  ABDOMEN: Soft, non-tender, non-distended MUSCULOSKELETAL:  No edema; No deformity  SKIN: Warm and dry LOWER EXTREMITIES: no swelling NEUROLOGIC:  Alert and oriented x 3 PSYCHIATRIC:  Normal affect   ASSESSMENT:    1. Nonischemic cardiomyopathy ([place])   2. Chronic combined systolic and diastolic CHF (congestive heart failure) ([place])   3. Essential hypertension   4. ICD (implantable cardioverter-defibrillator) in place   5. Snoring    PLAN:    In order of problems listed above:  Cardiomyopathy on guideline directed medical therapy but still SGLT2 inhibitor as well as spironolactone is missing.  I will ask him to have Chem-7 done if Chem-7 is fine we will start either Aldactone or SGLT2 blocker. Congestive heart failure: He is compensated.  Will continue present management. Essential hypertension elevated but hopefully will be better with addition of Aldactone. ICD denies having any discharges from the device normal function. Snoring: He is scheduled to have sleep study waiting for results of the test  J whitley chaperone me during entire visit   Medication Adjustments/Labs and Tests Ordered: Current medicines are reviewed at length with the patient today.  Concerns regarding medicines are outlined above.  No orders of the defined types were placed in this encounter.  Medication changes: No orders of the defined types were placed in this encounter.   Signed, Park Liter, MD, [facility] 04/14/2022 3:49 PM    [place]

## 2022-04-14 NOTE — Patient Instructions (Addendum)
Medication Instructions:  Your physician recommends that you continue on your current medications as directed. Please refer to the Current Medication list given to you today.  *If you need a refill on your cardiac medications before your next appointment, please call your pharmacy*   Lab Work: BMP- today  3rd Floor [address] If you have labs (blood work) drawn today and your tests are completely normal, you will receive your results only by: [place] (if you have MyChart) OR A paper copy in the mail If you have any lab test that is abnormal or we need to change your treatment, we will call you to review the results.   Testing/Procedures: Your physician has requested that you have an echocardiogram. Echocardiography is a painless test that uses sound waves to create images of your heart. It provides your doctor with information about the size and shape of your heart and how well your heart's chambers and valves are working. This procedure takes approximately one hour. There are no restrictions for this procedure. Please do NOT wear cologne, perfume, aftershave, or lotions (deodorant is allowed). Please arrive 15 minutes prior to your appointment time.    Follow-Up: At [facility], you and your health needs are our priority.  As part of our continuing mission to provide you with exceptional heart care, we have created designated Provider Care Teams.  These Care Teams include your primary Cardiologist (physician) and Advanced Practice Providers (APPs -  Physician Assistants and Nurse Practitioners) who all work together to provide you with the care you need, when you need it.  We recommend signing up for the patient portal called "MyChart".  Sign up information is provided on this After Visit Summary.  MyChart is used to connect with patients for Virtual Visits (Telemedicine).  Patients are able to view lab/test results, encounter notes, upcoming appointments, etc.  Non-urgent messages  can be sent to your provider as well.   To learn more about what you can do with MyChart, go to [email or web address].    Your next appointment:   6 month(s)  The format for your next appointment:   In Person  Provider:   Jenne Campus, MD    Other Instructions NA   This visit was accompanied by Ellard Artis.

## 2022-04-15 ENCOUNTER — Encounter: Payer: Self-pay | Admitting: *Deleted

## 2022-04-15 LAB — BASIC METABOLIC PANEL
BUN/Creatinine Ratio: 15 (ref 9–20)
BUN: 13 mg/dL (ref 6–24)
CO2: 22 mmol/L (ref 20–29)
Calcium: 9.3 mg/dL (ref 8.7–10.2)
Chloride: 105 mmol/L (ref 96–106)
Creatinine, Ser: 0.89 mg/dL (ref 0.76–1.27)
Glucose: 131 mg/dL — ABNORMAL HIGH (ref 70–99)
Potassium: 4.6 mmol/L (ref 3.5–5.2)
Sodium: 139 mmol/L (ref 134–144)
eGFR: 105 mL/min/{1.73_m2} (ref >59)

## 2022-04-15 NOTE — Progress Notes (Signed)
ATC x2.  LVM to return call.  Mychart message sent.

## 2022-04-15 NOTE — Telephone Encounter (Signed)
Patient has been called several times in regards to sleep study results and my chart message has been sent. Will leave encounter open for patient to call back

## 2022-04-15 NOTE — Telephone Encounter (Signed)
Pt's wife Sasha (DPR) and reviewed pt's HST results with her. Order was placed for C-Pap titration study and Sasha stated understanding. Sasha asked that when scheduling appt sleep center call her at [phone]. Nothing further needed at this time.

## 2022-04-16 ENCOUNTER — Telehealth: Payer: Self-pay

## 2022-04-16 MED ORDER — DAPAGLIFLOZIN PROPANEDIOL 10 MG PO TABS: 10.0000 mg | ORAL_TABLET | Freq: Every day | ORAL | 3 refills | Status: DC

## 2022-04-16 NOTE — Telephone Encounter (Signed)
LVM and My Chart Message per DPR- per Dr. Krasowski's note regarding normal lab results. Encouraged to call with any questions or concerns  

## 2022-04-17 ENCOUNTER — Encounter: Payer: Self-pay | Admitting: *Deleted

## 2022-04-17 NOTE — Progress Notes (Signed)
Unable to reach letter sent.

## 2022-04-27 ENCOUNTER — Telehealth: Payer: Self-pay

## 2022-04-27 NOTE — Telephone Encounter (Signed)
Left voicemail that PA was approved for [place]

## 2022-04-27 NOTE — Telephone Encounter (Signed)
PA submitted with CMM for Farxiga Outcome pending    [facility] Key: [id number] - PA Case ID: [id number]  Status Sent to Plan today Drug Dapagliflozin Propanediol 10MG tablets

## 2022-04-27 NOTE — Telephone Encounter (Signed)
Outcome Approved today Request Reference Number: [id number]. DAPAGLIFLOZI TAB 10MG is approved through 04/28/2023. For further questions, call [employer] at [phone]. Authorization Expiration Date: 04/28/2023

## 2022-05-01 ENCOUNTER — Telehealth: Payer: Self-pay

## 2022-05-01 NOTE — Telephone Encounter (Signed)
Prior Auth for farxiga approved  through 04/28/2023 [phone]

## 2022-05-05 NOTE — Progress Notes (Signed)
Dierdre Highman, RN  Registered Nurse  Telephone Encounter Signed  Encounter Date: 04/14/2022  Signed    Pt's wife Sasha [facility]) and reviewed pt's HST results with her. Order was placed for C-Pap titration study and Sasha stated understanding. Sasha asked that when scheduling appt sleep center call her at [phone]. Nothing further needed at this time.

## 2022-06-16 NOTE — Progress Notes (Unsigned)
Primary Physician/Referring:  Norm Salt, PA  Patient ID: Jeffery Glenn, male    DOB: 22-Nov-1970, 52 y.o.   MRN: [id number]  No chief complaint on file.  HPI:    Jeffery Glenn  is a 52 y.o. African American male, with a significant PMH of nonischemic cardiomyopathy severely reduced left ventricle ejection fraction, essential hypertension, obstructive sleep apnea, ICD present (11/08/2020), who presents to the office today to establish/transfer care. Patient has been followed by Dr Bing Matter in the past.    Past Medical History:  Diagnosis Date   Dyspnea on exertion 05/17/2018   Essential hypertension 05/17/2018   HFrEF (heart failure with reduced ejection fraction) (HCC)    Hypertension    ICD (implantable cardioverter-defibrillator) in place    Mitral regurgitation    NICM (nonischemic cardiomyopathy) (HCC)    Past Surgical History:  Procedure Laterality Date   HAND SURGERY     NASAL SEPTUM SURGERY     SUBQ ICD IMPLANT N/A 11/08/2020   Procedure: SUBQ ICD IMPLANT;  Surgeon: Regan Lemming, MD;  Location: [facility];  Service: Cardiovascular;  Laterality: N/A;   Family History  Problem Relation Age of Onset   Heart attack Mother    Hypertension Mother    Heart attack Father    Hypertension Father     Social History   Tobacco Use   Smoking status: Never    Passive exposure: Past   Smokeless tobacco: Never  Substance Use Topics   Alcohol use: Not Currently   Marital Status: Married  ROS  ***ROS Objective      04/14/2022    3:27 PM 03/09/2022   11:30 PM 03/09/2022   11:00 PM  Vitals with BMI  Height 6\' 0"     Weight 237 lbs    BMI 32.14    Systolic 130 173 315  Diastolic 78 127 114  Pulse 70 400 110       ***Physical Exam  Laboratory examination:   Recent Labs    08/27/21 0223 11/11/21 1531 12/30/21 1610 03/09/22 2024 03/09/22 2055 04/14/22 1600  NA 138   < > 139 136 139 139  K 3.6   < > 3.4* 3.9 4.0 4.6  CL 106   < > 106 101  --   105  CO2 26   < > 27 26  --  22  GLUCOSE 137*   < > 115* 102*  --  131*  BUN 19   < > 16 11  --  13  CREATININE 0.86   < > 0.90 1.04  --  0.89  CALCIUM 9.2   < > 9.1 8.5*  --  9.3  GFRNONAA >60  --  >60 >60  --   --    < > = values in this interval not displayed.    Lab Results  Component Value Date   GLUCOSE 131 (H) 04/14/2022   NA 139 04/14/2022   K 4.6 04/14/2022   CL 105 04/14/2022   CO2 22 04/14/2022   BUN 13 04/14/2022   CREATININE 0.89 04/14/2022   EGFR 105 04/14/2022   CALCIUM 9.3 04/14/2022   PROT 7.8 03/09/2022   ALBUMIN 3.9 03/09/2022   BILITOT 1.1 03/09/2022   ALKPHOS 35 (L) 03/09/2022   AST 18 03/09/2022   ALT 12 03/09/2022   ANIONGAP 9 03/09/2022      Lab Results  Component Value Date   ALT 12 03/09/2022   AST 18 03/09/2022   ALKPHOS 35 (  L) 03/09/2022   BILITOT 1.1 03/09/2022       Latest Ref Rng & Units 03/09/2022    8:24 PM 12/30/2021    4:10 PM 11/30/2020    4:49 PM  Hepatic Function  Total Protein 6.5 - 8.1 g/dL 7.8  7.9  7.6   Albumin 3.5 - 5.0 g/dL 3.9  4.4  4.2   AST 15 - 41 U/L 18  19  16    ALT 0 - 44 U/L 12  20  13    Alk Phosphatase 38 - 126 U/L 35  33  35   Total Bilirubin 0.3 - 1.2 mg/dL 1.1  1.6  0.8     Lipid Panel No results for input(s): "CHOL", "TRIG", "LDLCALC", "VLDL", "HDL", "CHOLHDL", "LDLDIRECT" in the last 8760 hours.  HEMOGLOBIN A1C No results found for: "HGBA1C", "MPG" TSH No results for input(s): "TSH" in the last 8760 hours.  External labs:   ***  Radiology:    Cardiac Studies:   Pacemaker Remote Device Check 02/23/2022 Scheduled remote reviewed. Normal device function.   Next remote 91 days. - May 25, 2022 LASensing Configuration: Secondary  Gain Setting: 1X  Post Shock Pacing: OFF   Echocardiogram 08/18/2021 1. Left ventricular ejection fraction, by estimation, is 20 to 25%. The left ventricle has severely decreased function. The left ventricle demonstrates global hypokinesis. There is mild  concentric left ventricular hypertrophy. Left ventricular diastolic  parameters are consistent with Grade I diastolic dysfunction (impaired relaxation).  2. Right ventricular systolic function is mildly reduced. The right ventricular size is normal.  3. Left atrial size was moderately dilated.  4. Right atrial size was moderately dilated.  5. The mitral valve is normal in structure. Mild mitral valve regurgitation. No evidence of mitral stenosis.  6. The aortic valve is tricuspid. There is mild calcification of the aortic valve. Aortic valve regurgitation is trivial. No aortic stenosis is present.  7. The inferior vena cava is normal in size with greater than 50% respiratory variability, suggesting right atrial pressure of 3 mmHg.   Cardiac Coronary CTA 06/15/2019 IMPRESSION: 1. Coronary calcium score of 0. This was 0 percentile for age and sex matched control. 2. Normal coronary origin with right dominance.  Ramus branch noted. 3. No evidence of CAD. 4. There is left ventricular hypertrophy (20 mm septum and 16 mm lateral wall). 5. Non ischemic cardiomyopathy, probably hypertensive in etiology.   EKG:   ***  *** Medications and allergies   Allergies  Allergen Reactions   Shellfish Allergy Other (See Comments)    hives   Peanut-Containing Drug Products Other (See Comments)    Allergy test/ Patient eats peanuts     Medication list   Current Outpatient Medications:    atorvastatin (LIPITOR) 20 MG tablet, Take 1 tablet (20 mg total) by mouth daily., Disp: 90 tablet, Rfl: 3   carvedilol (COREG) 25 MG tablet, Take 1 tablet (25 mg total) by mouth 2 (two) times daily with a meal., Disp: 180 tablet, Rfl: 2   dapagliflozin propanediol (FARXIGA) 10 MG TABS tablet, Take 1 tablet (10 mg total) by mouth daily before breakfast., Disp: 90 tablet, Rfl: 3   furosemide (LASIX) 40 MG tablet, Take 1 tablet (40 mg total) by mouth daily., Disp: 90 tablet, Rfl: 2   sacubitril-valsartan  (ENTRESTO) 97-103 MG, Take 1 tablet by mouth 2 (two) times daily., Disp: 60 tablet, Rfl: 0  Assessment     ICD-10-CM   1. Nonischemic cardiomyopathy  I42.8  2. HFrEF (heart failure with reduced ejection fraction)  I50.20     3. Essential (primary) hypertension  I10     4. OSA (obstructive sleep apnea)  G47.33     5. ICD (implantable cardioverter-defibrillator) in place  Z95.810        No orders of the defined types were placed in this encounter.   No orders of the defined types were placed in this encounter.   There are no discontinued medications.   Recommendations:   Jeffery Glenn  is a 52 y.o. African American male, with a significant PMH of nonischemic cardiomyopathy severely reduced left ventricle ejection fraction, essential hypertension, obstructive sleep apnea, ICD present (11/08/2020), who presents to the office today to establish/transfer care. Patient has been followed by Dr Bing Matter in the past.      Jeffery Decamp, MD, [facility] 06/16/2022, 8:02 PM Office: [phone]

## 2022-06-17 ENCOUNTER — Ambulatory Visit: Payer: Medicaid Other | Admitting: Cardiology

## 2022-06-17 ENCOUNTER — Encounter: Payer: Self-pay | Admitting: Cardiology

## 2022-06-17 ENCOUNTER — Telehealth: Payer: Self-pay | Admitting: Adult Health

## 2022-06-17 VITALS — BP 151/103 | HR 73 | Resp 16 | Ht 72.0 in | Wt 236.0 lb

## 2022-06-17 DIAGNOSIS — Z95810 Presence of automatic (implantable) cardiac defibrillator: Secondary | ICD-10-CM

## 2022-06-17 DIAGNOSIS — Z4502 Encounter for adjustment and management of automatic implantable cardiac defibrillator: Secondary | ICD-10-CM | POA: Insufficient documentation

## 2022-06-17 DIAGNOSIS — G4733 Obstructive sleep apnea (adult) (pediatric): Secondary | ICD-10-CM

## 2022-06-17 DIAGNOSIS — I428 Other cardiomyopathies: Secondary | ICD-10-CM

## 2022-06-17 DIAGNOSIS — I5020 Unspecified systolic (congestive) heart failure: Secondary | ICD-10-CM

## 2022-06-17 DIAGNOSIS — I10 Essential (primary) hypertension: Secondary | ICD-10-CM

## 2022-06-17 MED ORDER — SPIRONOLACTONE 25 MG PO TABS: 12.5000 mg | ORAL_TABLET | Freq: Once | ORAL | Status: DC

## 2022-06-18 NOTE — Telephone Encounter (Signed)
Please refer to 04/14/2022 telephone note.   Spoke to patient's spouse, Sasha(DPR). She is calling for update on cpap titration.    PCC's please advise. Thanks

## 2022-06-18 NOTE — Telephone Encounter (Signed)
Sleep study done 03/26/22 Results gave to wife on 04/14/22 Cpap Titration Study ordered on 04/15/22 and gave to Amberley P to get PA

## 2022-06-19 ENCOUNTER — Telehealth: Payer: Self-pay | Admitting: *Deleted

## 2022-06-19 NOTE — Telephone Encounter (Signed)
Yesi,  This pt's cardiac EF is below the [facility] standard.  His procedure will need to be done at the hospital.  Thanks,  Casanova Schurman 

## 2022-06-22 ENCOUNTER — Telehealth: Payer: Self-pay

## 2022-06-22 NOTE — Telephone Encounter (Signed)
Left detailed message regarding patients upcoming pre visit.  Let patient know that anesthesia has recommended, that because of his EF is below our [facility] standard that we were cancelling his Pre visit on 4/17 and his colonoscopy.  Will try to call back.

## 2022-06-22 NOTE — Telephone Encounter (Signed)
Dr. Leone Payor just wanted to make you aware per Cathlyn Parsons pt is not a candidate for [facility] due to reduced EF. Please advise how you would like to proceed. OV or direct to the hospital thank you.

## 2022-06-23 ENCOUNTER — Encounter: Payer: Self-pay | Admitting: Cardiology

## 2022-06-23 NOTE — Telephone Encounter (Signed)
Called back and left another message that he did not need to come to his pre visit on 4/17 and that we would be contacting him if he needed an OV or if he would be Direct to the hospital.

## 2022-06-23 NOTE — Telephone Encounter (Signed)
The patient is getting another echocardiogram in May that could change things. I will place him on my procedure needed list and review things and decide [facility] vs hospital after that.

## 2022-06-29 NOTE — Telephone Encounter (Signed)
Pt was contacted to ensure that he was aware that he procedure has been canceled and will have to be done in hospital. Pt stated that he is aware. Will contact pt once recommendation is received for OV or direct hospital procedure.

## 2022-06-30 ENCOUNTER — Encounter: Payer: Medicaid Other | Admitting: Internal Medicine

## 2022-07-01 NOTE — Telephone Encounter (Signed)
Chart reviewed and noted: Jeffery Boop, MD    Note The patient is getting another echocardiogram in May that could change things. I will place him on my procedure needed list and review things and decide [facility] vs hospital after that.

## 2022-07-07 NOTE — Telephone Encounter (Signed)
This has been authorized [id number] from  06/23/22-09/06/22.  Pt has been scheduled for 07/27/22 & I have left a detailed message w/ appt info.

## 2022-07-09 ENCOUNTER — Other Ambulatory Visit: Payer: Medicaid Other

## 2022-07-17 ENCOUNTER — Other Ambulatory Visit: Payer: Medicaid Other

## 2022-07-27 ENCOUNTER — Encounter (HOSPITAL_BASED_OUTPATIENT_CLINIC_OR_DEPARTMENT_OTHER): Payer: Medicaid Other | Admitting: Pulmonary Disease

## 2022-07-29 ENCOUNTER — Ambulatory Visit: Payer: Medicaid Other | Admitting: Cardiology

## 2022-08-11 ENCOUNTER — Ambulatory Visit (HOSPITAL_BASED_OUTPATIENT_CLINIC_OR_DEPARTMENT_OTHER): Payer: Medicaid Other | Attending: Adult Health | Admitting: Pulmonary Disease

## 2022-08-11 DIAGNOSIS — G4733 Obstructive sleep apnea (adult) (pediatric): Secondary | ICD-10-CM

## 2022-08-12 DIAGNOSIS — G4733 Obstructive sleep apnea (adult) (pediatric): Secondary | ICD-10-CM | POA: Diagnosis not present

## 2022-08-12 NOTE — Procedures (Signed)
     Patient Name: Jeffery Glenn, Jeffery Glenn Date: 08/11/2022 Gender: Male D.O.B: [birth year] Age (years): 52 Referring Provider: Tammy Parrett Height (inches): 72 Interpreting Physician: Coralyn Helling MD, ABSM Weight (lbs): 225 RPSGT: Moro Sink BMI: 31 MRN: [id number] Neck Size: 17.50  CLINICAL INFORMATION The patient is referred for a CPAP titration to treat sleep apnea.  Date of HST 03/26/22: AHI 42.9, SpO2 low 76%  SLEEP STUDY TECHNIQUE As per the AASM Manual for the Scoring of Sleep and Associated Events v2.3 (April 2016) with a hypopnea requiring 4% desaturations.  The channels recorded and monitored were frontal, central and occipital EEG, electrooculogram (EOG), submentalis EMG (chin), nasal and oral airflow, thoracic and abdominal wall motion, anterior tibialis EMG, snore microphone, electrocardiogram, and pulse oximetry. Continuous positive airway pressure (CPAP) was initiated at the beginning of the study and titrated to treat sleep-disordered breathing.  MEDICATIONS Medications self-administered by patient taken the night of the study : N/A  TECHNICIAN COMMENTS Comments added by technician: None Comments added by scorer: N/A  RESPIRATORY PARAMETERS Optimal PAP Pressure (cm): 14 AHI at Optimal Pressure (/hr): 6.7 Overall Minimal O2 (%): 89.0 Supine % at Optimal Pressure (%): 100 Minimal O2 at Optimal Pressure (%): 94.0   SLEEP ARCHITECTURE The study was initiated at 8:35:38 AM and ended at 3:31:30 PM.  Sleep onset time was 17.4 minutes and the sleep efficiency was 80.7%. The total sleep time was 335.5 minutes.  The patient spent 13.0% of the night in stage N1 sleep, 50.8% in stage N2 sleep, 0.0% in stage N3 and 36.2% in REM.Stage REM latency was 19.0 minutes  Wake after sleep onset was 63.0. Alpha intrusion was absent. Supine sleep was 100.00%.  CARDIAC DATA The 2 lead EKG demonstrated sinus rhythm. The mean heart rate was 69.5 beats per minute. Other EKG  findings include: None.  LEG MOVEMENT DATA The total Periodic Limb Movements of Sleep (PLMS) were 0. The PLMS index was 0.0. A PLMS index of <15 is considered normal in adults.  IMPRESSIONS - The optimal PAP pressure was 14 cm of water. - He did not need supplemental oxygen during this study. - Clinically significant periodic limb movements were not noted during this study. Arousals associated with PLMs were rare.  DIAGNOSIS - Obstructive Sleep Apnea (G47.33)  RECOMMENDATIONS - Trial of CPAP therapy on 14 cm H2O with a Medium size Fisher&Paykel Full Face Simplus mask and heated humidification. - Assess for the presence of restless leg syndrome. - Avoid alcohol, sedatives and other CNS depressants that may worsen sleep apnea and disrupt normal sleep architecture. - Sleep hygiene should be reviewed to assess factors that may improve sleep quality. - Weight management and regular exercise should be initiated or continued.  [Electronically signed] 08/12/2022 01:12 PM  Coralyn Helling MD, ABSM Diplomate, American Board of Sleep Medicine NPI: [id number]  [facility] PH: [phone]   FX: [phone] ACCREDITED BY THE AMERICAN ACADEMY OF SLEEP MEDICINE

## 2022-08-13 ENCOUNTER — Telehealth: Payer: Self-pay | Admitting: Adult Health

## 2022-08-13 NOTE — Telephone Encounter (Signed)
ATC the patient. LVM for the patient to return my call. 

## 2022-08-13 NOTE — Telephone Encounter (Signed)
Please set up office visit to discuss his sleep study results in detail.  Can set up for in person or virtual.  Also can double book

## 2022-08-14 NOTE — Telephone Encounter (Signed)
Lm x2 for patient. Will call once more due to nature of call.   

## 2022-08-14 NOTE — Telephone Encounter (Signed)
Per the patient's MyChart, he accessed his MyChart today. I have sent him a patient message to let him know he needs an appointment with Tammy to discuss his sleep study.

## 2022-08-17 NOTE — Telephone Encounter (Signed)
I spoke with the patient and scheduled him an appt for 08/20/2022 at 2:30pm with Rubye Oaks, NP.  Nothing further needed.

## 2022-08-20 ENCOUNTER — Ambulatory Visit: Payer: Medicaid Other | Admitting: Adult Health

## 2022-08-20 ENCOUNTER — Encounter: Payer: Self-pay | Admitting: Adult Health

## 2022-08-20 VITALS — BP 132/84 | HR 90 | Temp 97.3°F | Ht 72.0 in | Wt 236.0 lb

## 2022-08-20 DIAGNOSIS — G4733 Obstructive sleep apnea (adult) (pediatric): Secondary | ICD-10-CM

## 2022-08-20 DIAGNOSIS — I5042 Chronic combined systolic (congestive) and diastolic (congestive) heart failure: Secondary | ICD-10-CM | POA: Diagnosis not present

## 2022-08-20 NOTE — Assessment & Plan Note (Signed)
Continue follow-up with cardiology 

## 2022-08-20 NOTE — Patient Instructions (Addendum)
Begin CPAP at bedtime goal is to wear all night long for at least 6 or more hours Work on healthy weight loss Healthy sleep regimen Do not drive if sleepy Follow-up in 3 months and As needed

## 2022-08-20 NOTE — Assessment & Plan Note (Signed)
Severe obstructive sleep apnea we discussed his sleep study results in detail.  Recent titration study showed optimal control on CPAP at 14 cm H2O.  Will begin CPAP therapy.  Patient education was given. - discussed how weight can impact sleep and risk for sleep disordered breathing - discussed options to assist with weight loss: combination of diet modification, cardiovascular and strength training exercises   - had an extensive discussion regarding the adverse health consequences related to untreated sleep disordered breathing - specifically discussed the risks for hypertension, coronary artery disease, cardiac dysrhythmias, cerebrovascular disease, and diabetes - lifestyle modification discussed   - discussed how sleep disruption can increase risk of accidents, particularly when driving - safe driving practices were discussed   Plan  Patient Instructions  Begin CPAP at bedtime goal is to wear all night long for at least 6 or more hours Work on healthy weight loss Healthy sleep regimen Do not drive if sleepy Follow-up in 3 months and As needed

## 2022-08-20 NOTE — Progress Notes (Signed)
@Patient  ID: Jeffery Glenn, male    DOB: 28-May-1970, 52 y.o.   MRN: [id number]  Chief Complaint  Patient presents with   Follow-up    Referring provider: Norm Salt, [place]  HPI: 52 year old male seen for sleep consult October 24, 2021 for daytime sleepiness and snoring found to have severe obstructive sleep apnea Nonischemic cardiomyopathy, congestive heart failure, status post ICD  TEST/EVENTS :  Home sleep study March 26, 2022 showed severe sleep apnea with a AHI at 42.9/hour and SpO2 low at 76%  CPAP titration study June 06/2022 showed optimal control on CPAP pressure 14 cm H2O.  No oxygen was required.  08/20/2022 Follow up ; OSA Patient returns for a follow-up visit.  Patient was seen in August for sleep consult.  He had daytime sleepiness and snoring.  He was set up for a in lab sleep study.  Unfortunately insurance denied in lab study.  Patient was set up for home sleep study that was done on March 26, 2022 that showed severe sleep apnea with AHI of 42.9/hour and SpO2 low at 76%.  He was set up for a titration study that was done on August 11, 2022 that showed optimal control on CPAP pressure of 14 cm H2O.  We discussed his sleep study results in detail.  Discussed starting on CPAP for treatment of his underlying severe sleep apnea .        Allergies  Allergen Reactions   Shellfish Allergy Other (See Comments)    hives   Peanut-Containing Drug Products Other (See Comments)    Allergy test/ Patient eats peanuts    Immunization History  Administered Date(s) Administered   Influenza,inj,Quad PF,6+ Mos 12/06/2020   Influenza-Unspecified 01/28/2018, 10/20/2018   Tdap 08/20/2015    Past Medical History:  Diagnosis Date   Dyspnea on exertion 05/17/2018   Essential hypertension 05/17/2018   HFrEF (heart failure with reduced ejection fraction) (HCC)    Hypertension    ICD (implantable cardioverter-defibrillator) in place    Mitral regurgitation    NICM (nonischemic  cardiomyopathy) (HCC)     Tobacco History: Social History   Tobacco Use  Smoking Status Never   Passive exposure: Past  Smokeless Tobacco Never   Counseling given: Not Answered   Outpatient Medications Prior to Visit  Medication Sig Dispense Refill   atorvastatin (LIPITOR) 20 MG tablet Take 1 tablet (20 mg total) by mouth daily. 90 tablet 3   carvedilol (COREG) 25 MG tablet Take 1 tablet (25 mg total) by mouth 2 (two) times daily with a meal. 180 tablet 2   dapagliflozin propanediol (FARXIGA) 10 MG TABS tablet Take 1 tablet (10 mg total) by mouth daily before breakfast. 90 tablet 3   furosemide (LASIX) 40 MG tablet Take 1 tablet (40 mg total) by mouth daily. 90 tablet 2   sacubitril-valsartan (ENTRESTO) 97-103 MG Take 1 tablet by mouth 2 (two) times daily. 60 tablet 0   spironolactone (ALDACTONE) 25 MG tablet Take 0.5 tablets (12.5 mg total) by mouth once for 1 dose. 90 tablet    No facility-administered medications prior to visit.     Review of Systems:   Constitutional:   No  weight loss, night sweats,  Fevers, chills, + fatigue, or  lassitude.  HEENT:   No headaches,  Difficulty swallowing,  Tooth/dental problems, or  Sore throat,                No sneezing, itching, ear ache, nasal congestion, post nasal drip,  CV:  No chest pain,  Orthopnea, PND, swelling in lower extremities, anasarca, dizziness, palpitations, syncope.   GI  No heartburn, indigestion, abdominal pain, nausea, vomiting, diarrhea, change in bowel habits, loss of appetite, bloody stools.   Resp: No shortness of breath with exertion or at rest.  No excess mucus, no productive cough,  No non-productive cough,  No coughing up of blood.  No change in color of mucus.  No wheezing.  No chest wall deformity  Skin: no rash or lesions.  GU: no dysuria, change in color of urine, no urgency or frequency.  No flank pain, no hematuria   MS:  No joint pain or swelling.  No decreased range of motion.  No back  pain.    Physical Exam  BP 132/84 (BP Location: Left Arm, Patient Position: Sitting, Cuff Size: Normal)   Pulse 90   Temp (!) 97.3 F (36.3 C) (Temporal)   Ht 6' (1.829 m)   Wt 236 lb (107 kg)   SpO2 97%   BMI 32.01 kg/m   GEN: A/Ox3; pleasant , NAD, well nourished    HEENT:  [place]/AT,   NOSE-clear, THROAT-clear, no lesions, no postnasal drip or exudate noted.  Class III MP airway  NECK:  Supple w/ fair ROM; no JVD; normal carotid impulses w/o bruits; no thyromegaly or nodules palpated; no lymphadenopathy.    RESP  Clear  P & A; w/o, wheezes/ rales/ or rhonchi. no accessory muscle use, no dullness to percussion  CARD:  RRR, no m/r/g, no peripheral edema, pulses intact, no cyanosis or clubbing.  GI:   Soft & nt; nml bowel sounds; no organomegaly or masses detected.   Musco: Warm bil, no deformities or joint swelling noted.   Neuro: alert, no focal deficits noted.    Skin: Warm, no lesions or rashes    Lab Results:  CBC   BMET     Imaging: No results found.        No data to display          No results found for: "NITRICOXIDE"      Assessment & Plan:   OSA (obstructive sleep apnea) Severe obstructive sleep apnea we discussed his sleep study results in detail.  Recent titration study showed optimal control on CPAP at 14 cm H2O.  Will begin CPAP therapy.  Patient education was given. - discussed how weight can impact sleep and risk for sleep disordered breathing - discussed options to assist with weight loss: combination of diet modification, cardiovascular and strength training exercises   - had an extensive discussion regarding the adverse health consequences related to untreated sleep disordered breathing - specifically discussed the risks for hypertension, coronary artery disease, cardiac dysrhythmias, cerebrovascular disease, and diabetes - lifestyle modification discussed   - discussed how sleep disruption can increase risk of accidents,  particularly when driving - safe driving practices were discussed   Plan  Patient Instructions  Begin CPAP at bedtime goal is to wear all night long for at least 6 or more hours Work on healthy weight loss Healthy sleep regimen Do not drive if sleepy Follow-up in 3 months and As needed     Chronic combined systolic and diastolic CHF (congestive heart failure) (HCC) Continue follow-up with cardiology    Rubye Oaks, NP 08/20/2022

## 2022-08-20 NOTE — Addendum Note (Signed)
Addended by: Lanna Poche on: 08/20/2022 03:11 PM   Modules accepted: Orders

## 2022-08-21 NOTE — Telephone Encounter (Signed)
Cancel appointments and follow up unless he decides to call,

## 2022-08-24 ENCOUNTER — Ambulatory Visit: Payer: Medicaid Other

## 2022-09-11 ENCOUNTER — Other Ambulatory Visit: Payer: Medicaid Other

## 2022-09-14 ENCOUNTER — Ambulatory Visit: Payer: Medicaid Other

## 2022-09-14 DIAGNOSIS — I5020 Unspecified systolic (congestive) heart failure: Secondary | ICD-10-CM

## 2022-09-15 ENCOUNTER — Telehealth (HOSPITAL_BASED_OUTPATIENT_CLINIC_OR_DEPARTMENT_OTHER): Payer: Medicaid Other | Admitting: Adult Health

## 2022-09-16 ENCOUNTER — Encounter: Payer: Self-pay | Admitting: Cardiology

## 2022-09-16 ENCOUNTER — Ambulatory Visit: Payer: Medicaid Other | Admitting: Cardiology

## 2022-09-16 VITALS — BP 140/98 | HR 67 | Resp 16 | Ht 72.0 in | Wt 235.2 lb

## 2022-09-16 DIAGNOSIS — I5020 Unspecified systolic (congestive) heart failure: Secondary | ICD-10-CM

## 2022-09-16 DIAGNOSIS — Z95810 Presence of automatic (implantable) cardiac defibrillator: Secondary | ICD-10-CM

## 2022-09-16 DIAGNOSIS — I420 Dilated cardiomyopathy: Secondary | ICD-10-CM

## 2022-09-16 DIAGNOSIS — I10 Essential (primary) hypertension: Secondary | ICD-10-CM

## 2022-09-16 MED ORDER — CARVEDILOL 25 MG PO TABS: 37.5000 mg | ORAL_TABLET | Freq: Two times a day (BID) | ORAL | 2 refills | Status: DC

## 2022-09-16 MED ORDER — FUROSEMIDE 40 MG PO TABS: 40.0000 mg | ORAL_TABLET | Freq: Every day | ORAL | 2 refills | Status: DC | PRN

## 2022-09-16 NOTE — Progress Notes (Addendum)
Primary Physician/Referring:  Norm Salt, PA  Patient ID: Jeffery Glenn, male    DOB: 04-20-1970, 52 y.o.   MRN: [id number]  Chief Complaint  Patient presents with   Nonischemic cardiomyopathy   Follow-up   Results   HPI:    Jeffery Glenn  is a 52 y.o. African American male, with a significant PMH of nonischemic cardiomyopathy severely reduced left ventricle ejection fraction, essential hypertension, severe obstructive sleep apnea (04/2022) has started CPAP therapy and trying to get used to it, SQ ICD present (11/08/2020), who presents to the office for f/u of chronic systolic heart failure due to uncontrolled hypertension.  Patient is currently not working d/t his illnesses and missing work. Today he denies chest pain, shortness of breath, palpitations, leg edema, orthopnea, PND, TIA/syncope.   Past Medical History:  Diagnosis Date   Dyspnea on exertion 05/17/2018   Essential hypertension 05/17/2018   HFrEF (heart failure with reduced ejection fraction) (HCC)    Hypertension    ICD (implantable cardioverter-defibrillator) in place    Mitral regurgitation    NICM (nonischemic cardiomyopathy) (HCC)    Past Surgical History:  Procedure Laterality Date   HAND SURGERY     NASAL SEPTUM SURGERY     SUBQ ICD IMPLANT N/A 11/08/2020   Procedure: SUBQ ICD IMPLANT;  Surgeon: Regan Lemming, MD;  Location: [facility] INVASIVE CV LAB;  Service: Cardiovascular;  Laterality: N/A;   Family History  Problem Relation Age of Onset   Heart attack Mother    Hypertension Mother    Heart attack Father    Hypertension Father    Heart attack Brother 63       first one    Social History   Tobacco Use   Smoking status: Never    Passive exposure: Past   Smokeless tobacco: Never  Substance Use Topics   Alcohol use: Yes    Comment: occ   Marital Status: Married  ROS  Review of Systems  Cardiovascular:  Negative for chest pain, dyspnea on exertion and leg swelling.   Objective       09/16/2022    1:29 PM 09/16/2022    1:13 PM 08/20/2022    2:12 PM  Vitals with BMI  Height  6\' 0"  6\' 0"   Weight  235 lbs 3 oz 236 lbs  BMI  31.89 32  Systolic 140 154 742  Diastolic 98 105 84  Pulse  67 90  Blood pressure (!) 140/98, pulse 67, resp. rate 16, height 6' (1.829 m), weight 235 lb 3.2 oz (106.7 kg), SpO2 96 %.   Physical Exam Cardiovascular:     Rate and Rhythm: Normal rate.     Pulses: Normal pulses.     Heart sounds: Normal heart sounds. No murmur heard.    No gallop.  Pulmonary:     Effort: Pulmonary effort is normal.     Breath sounds: Normal breath sounds.  Abdominal:     General: Bowel sounds are normal.     Palpations: Abdomen is soft.  Musculoskeletal:     Right lower leg: No edema.     Left lower leg: No edema.    Laboratory examination:   Recent Labs    12/30/21 1610 03/09/22 2024 03/09/22 2055 04/14/22 1600  NA 139 136 139 139  K 3.4* 3.9 4.0 4.6  CL 106 101  --  105  CO2 27 26  --  22  GLUCOSE 115* 102*  --  131*  BUN 16 11  --  13  CREATININE 0.90 1.04  --  0.89  CALCIUM 9.1 8.5*  --  9.3  GFRNONAA >60 >60  --   --    Lab Results  Component Value Date   GLUCOSE 131 (H) 04/14/2022   NA 139 04/14/2022   K 4.6 04/14/2022   CL 105 04/14/2022   CO2 22 04/14/2022   BUN 13 04/14/2022   CREATININE 0.89 04/14/2022   EGFR 105 04/14/2022   CALCIUM 9.3 04/14/2022   PROT 7.8 03/09/2022   ALBUMIN 3.9 03/09/2022   BILITOT 1.1 03/09/2022   ALKPHOS 35 (L) 03/09/2022   AST 18 03/09/2022   ALT 12 03/09/2022   ANIONGAP 9 03/09/2022     Lab Results  Component Value Date   ALT 12 03/09/2022   AST 18 03/09/2022   ALKPHOS 35 (L) 03/09/2022   BILITOT 1.1 03/09/2022       Latest Ref Rng & Units 03/09/2022    8:24 PM 12/30/2021    4:10 PM 11/30/2020    4:49 PM  Hepatic Function  Total Protein 6.5 - 8.1 g/dL 7.8  7.9  7.6   Albumin 3.5 - 5.0 g/dL 3.9  4.4  4.2   AST 15 - 41 U/L 18  19  16    ALT 0 - 44 U/L 12  20  13    Alk Phosphatase 38 -  126 U/L 35  33  35   Total Bilirubin 0.3 - 1.2 mg/dL 1.1  1.6  0.8    External labs:   Labs 06/05/2022:  A1c 6.1%.  Hb 14.0/HCT 43.8, platelets 253, normal indicis.  Serum glucose 113 mg, BUN 15, creatinine 0.94, EGFR 99 mL, potassium 4.5, LFTs normal.  Total cholesterol 146, triglycerides 76, HDL 42, LDL 87.  Non-HDL cholesterol 104.  Labs 09/30/2021:  Total cholesterol 157, triglycerides 93, HDL 44, LDL 94.  Non-HDL cholesterol 113.  Serum glucose 111 mg, BUN 15, creatinine 1.07, EGFR 85 mill, potassium 3.8, LFTs normal.  Hb 13.6/HCT 41.4, platelets 203.  A1c 5.4%.  TSH normal at 1.45.  Radiology:    Cardiac Studies:   Sleep Study 05/03/2022: Severe Sleep apnea - CPAP ordered  Pacemaker Remote Device Check 02/23/2022 Scheduled remote reviewed. Normal device function.   Next remote 91 days. - May 25, 2022 LASensing Configuration: Secondary  Gain Setting: 1X  Post Shock Pacing: OFF   Echocardiogram 08/18/2021 1. Left ventricular ejection fraction, by estimation, is 20 to 25%. The left ventricle has severely decreased function. The left ventricle demonstrates global hypokinesis. There is mild concentric left ventricular hypertrophy. Left ventricular diastolic  parameters are consistent with Grade I diastolic dysfunction (impaired relaxation).  2. Right ventricular systolic function is mildly reduced. The right ventricular size is normal.  3. Left atrial size was moderately dilated.  4. Right atrial size was moderately dilated.  5. The mitral valve is normal in structure. Mild mitral valve regurgitation. No evidence of mitral stenosis.  6. The aortic valve is tricuspid. There is mild calcification of the aortic valve. Aortic valve regurgitation is trivial. No aortic stenosis is present.  7. The inferior vena cava is normal in size with greater than 50% respiratory variability, suggesting right atrial pressure of 3 mmHg.   Cardiac Coronary CTA 06/15/2019 IMPRESSION: 1.  Coronary calcium score of 0. This was 0 percentile for age and  sex matched control. 2. Normal coronary origin with right dominance.  Ramus branch noted. 3. No evidence of CAD. 4. There is left ventricular hypertrophy (20 mm septum  and 16 mm lateral wall). 5. Non ischemic cardiomyopathy, probably hypertensive in etiology.  No results found for this or any previous visit from the past 1095 days.  Echocardiogram 09/14/2022:  Left ventricle cavity is moderately dilated. Dilated cardiomyopathy. Moderate concentric hypertrophy of the left ventricle. Hypokinetic global wall motion. Doppler evidence of grade III (pseudonormal) diastolic dysfunction. Severely depressed LV systolic function with visual EF 20-25%. Calculated EF 28%. Left atrial cavity is severely dilated at 50.7 ml/m^2. Right ventricle cavity is normal in size. Mildly reduced right ventricular function. Trileaflet aortic valve. Trace aortic regurgitation. Structurally normal mitral valve. Moderate (Grade II) mitral regurgitation. Structurally normal tricuspid valve. Mild tricuspid regurgitation. No evidence of pulmonary hypertension. No significant change from 08/18/2021. Previously grade I diastolic dysfunction now grade III     ICD ( Sub-Q) [employer] emblem MRI S ICD 11/08/2020    Remote subcutaneous ICD transmission 07/20/2022: No high ventricular rate episodes, no therapy.  No monitored AF episodes.  Longevity 81% battery life.  EKG:   EKG 06/17/2022: Normal sinus rhythm at rate of 65 beats minute, left atrial enlargement, left axis deviation, left anterior block.  Poor R progression, cannot extract a septal infarct old.  LVH with repolarization abnormality.    Medications and allergies   Allergies  Allergen Reactions   Shellfish Allergy Other (See Comments)    hives   Peanut-Containing Drug Products Other (See Comments)    Allergy test/ Patient eats peanuts    Medication list   Current Outpatient Medications:     atorvastatin (LIPITOR) 20 MG tablet, Take 1 tablet (20 mg total) by mouth daily., Disp: 90 tablet, Rfl: 3   dapagliflozin propanediol (FARXIGA) 10 MG TABS tablet, Take 1 tablet (10 mg total) by mouth daily before breakfast., Disp: 90 tablet, Rfl: 3   sacubitril-valsartan (ENTRESTO) 97-103 MG, Take 1 tablet by mouth 2 (two) times daily., Disp: 60 tablet, Rfl: 0   carvedilol (COREG) 25 MG tablet, Take 1.5 tablets (37.5 mg total) by mouth 2 (two) times daily with a meal., Disp: 270 tablet, Rfl: 2   furosemide (LASIX) 40 MG tablet, Take 1 tablet (40 mg total) by mouth daily as needed for fluid or edema (Weight gain >3 Lbs in 3 days)., Disp: 90 tablet, Rfl: 2   spironolactone (ALDACTONE) 25 MG tablet, Take 0.5 tablets (12.5 mg total) by mouth once for 1 dose., Disp: 90 tablet, Rfl:   Assessment     ICD-10-CM   1. Dilated cardiomyopathy (HCC)  I42.0 carvedilol (COREG) 25 MG tablet    2. HFrEF (heart failure with reduced ejection fraction) (HCC)  I50.20 carvedilol (COREG) 25 MG tablet    3. Essential (primary) hypertension  I10     4. ICD ( Sub-Q) [employer] emblem MRI S ICD  11/08/2020  Z95.810       No orders of the defined types were placed in this encounter.  Meds ordered this encounter  Medications   carvedilol (COREG) 25 MG tablet    Sig: Take 1.5 tablets (37.5 mg total) by mouth 2 (two) times daily with a meal.    Dispense:  270 tablet    Refill:  2   furosemide (LASIX) 40 MG tablet    Sig: Take 1 tablet (40 mg total) by mouth daily as needed for fluid or edema (Weight gain >3 Lbs in 3 days).    Dispense:  90 tablet    Refill:  2    Medications Discontinued During This Encounter  Medication Reason  carvedilol (COREG) 25 MG tablet Reorder   furosemide (LASIX) 40 MG tablet Reorder     Recommendations:   Jeffery Glenn  is a 52 y.o. African American male, with a significant PMH of nonischemic cardiomyopathy severely reduced left ventricle ejection fraction, essential  hypertension, severe obstructive sleep apnea (04/2022) has started CPAP therapy and trying to get used to it, SQ ICD present (11/08/2020), who presents to the office for f/u of chronic systolic heart failure due to uncontrolled hypertension.  1. Dilated cardiomyopathy (HCC) Reviewed the echocardiogram, patient has moderately dilated LV with persistent severe LV systolic dysfunction.  Fortunately no clinical evidence of heart failure.  Will increase his carvedilol from 25 mg twice daily to 37.5 mg twice daily. - carvedilol (COREG) 25 MG tablet; Take 1.5 tablets (37.5 mg total) by mouth 2 (two) times daily with a meal.  Dispense: 270 tablet; Refill: 2  2. HFrEF (heart failure with reduced ejection fraction) (HCC) Patient blood pressure is uncontrolled today, he did not take a.m. medications as he had multiple appointments today.  Hopefully increasing carvedilol will improve his blood pressure also however on his prior office visit with me just 6 weeks ago, blood pressure was normal. - carvedilol (COREG) 25 MG tablet; Take 1.5 tablets (37.5 mg total) by mouth 2 (two) times daily with a meal.  Dispense: 270 tablet; Refill: 2  3. Essential (primary) hypertension As dictated above, suspect burned-out hypertension to be the etiology for his cardiomyopathy.  4. ICD ( Sub-Q) [employer] emblem MRI S ICD  11/08/2020 Patient has subcu ICD and is functioning normally.  I discussed with him today regarding excess fluid intake.  Patient is extremely thirsty and has been drinking excessive amount of fluid, will change furosemide to as needed use only as he is on best medical therapy including Farxiga and spironolactone.  I also discussed with him the importance of treatment of severe obstructive sleep apnea, patient having issues with the mask he will continue to follow closely with sleep clinic.  I will see him back in 3 months for follow-up.  Other orders - furosemide (LASIX) 40 MG tablet; Take 1 tablet  (40 mg total) by mouth daily as needed for fluid or edema (Weight gain >3 Lbs in 3 days).  Dispense: 90 tablet; Refill: 2   Yates Decamp, MD, [facility] 09/16/2022, 1:45 PM Office: [phone]

## 2022-09-17 ENCOUNTER — Ambulatory Visit: Payer: Medicaid Other | Admitting: Cardiology

## 2022-10-08 IMAGING — CR DG CHEST 2V
2 series · 2 of 2 positions shown · non-contrast
Comparison: Radiograph 05/06/2018.

CLINICAL DATA: Persistent cough and shortness of breath. COVID
positive 03/13/2020.

EXAM:
CHEST - 2 VIEW

[w chest pa]
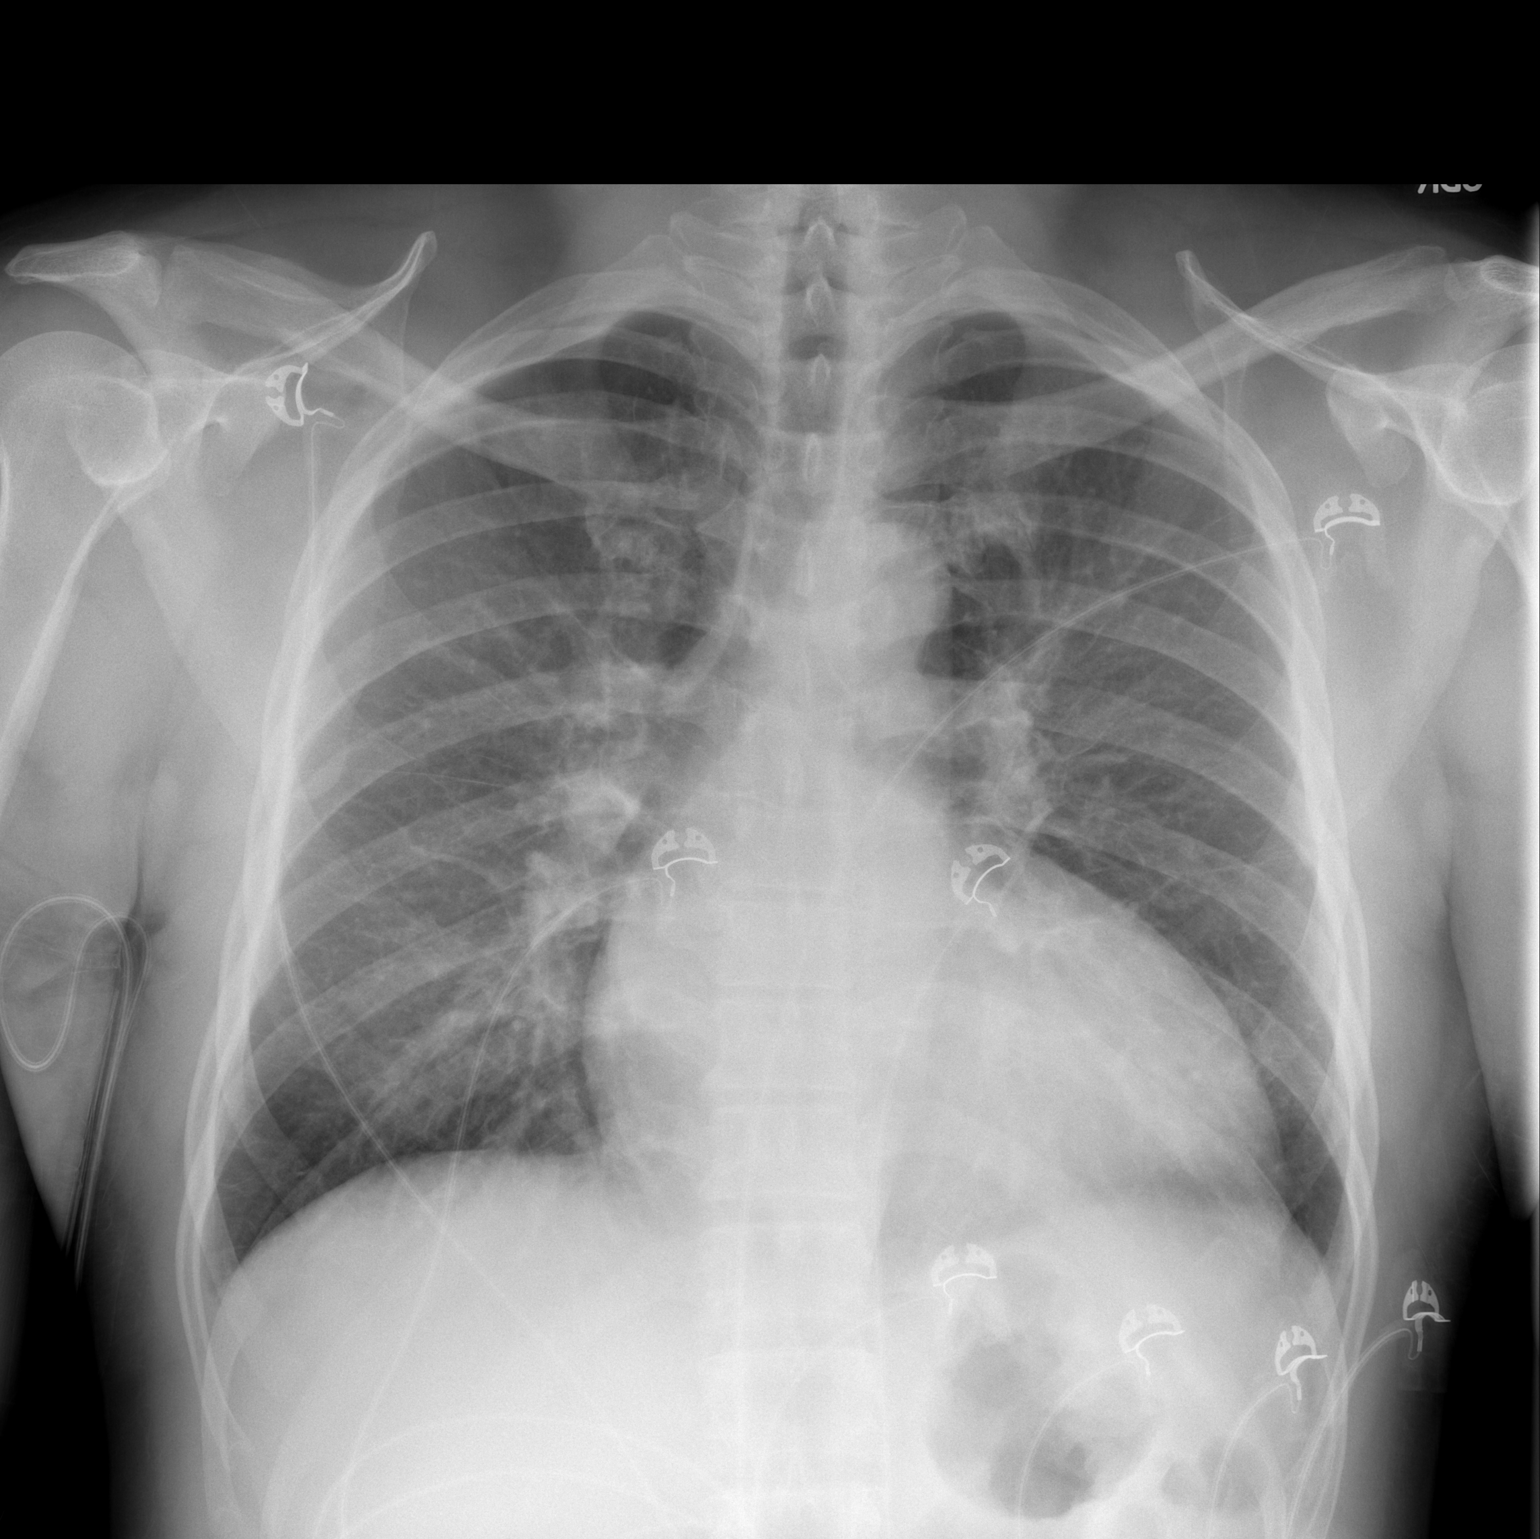

[w chest lat]
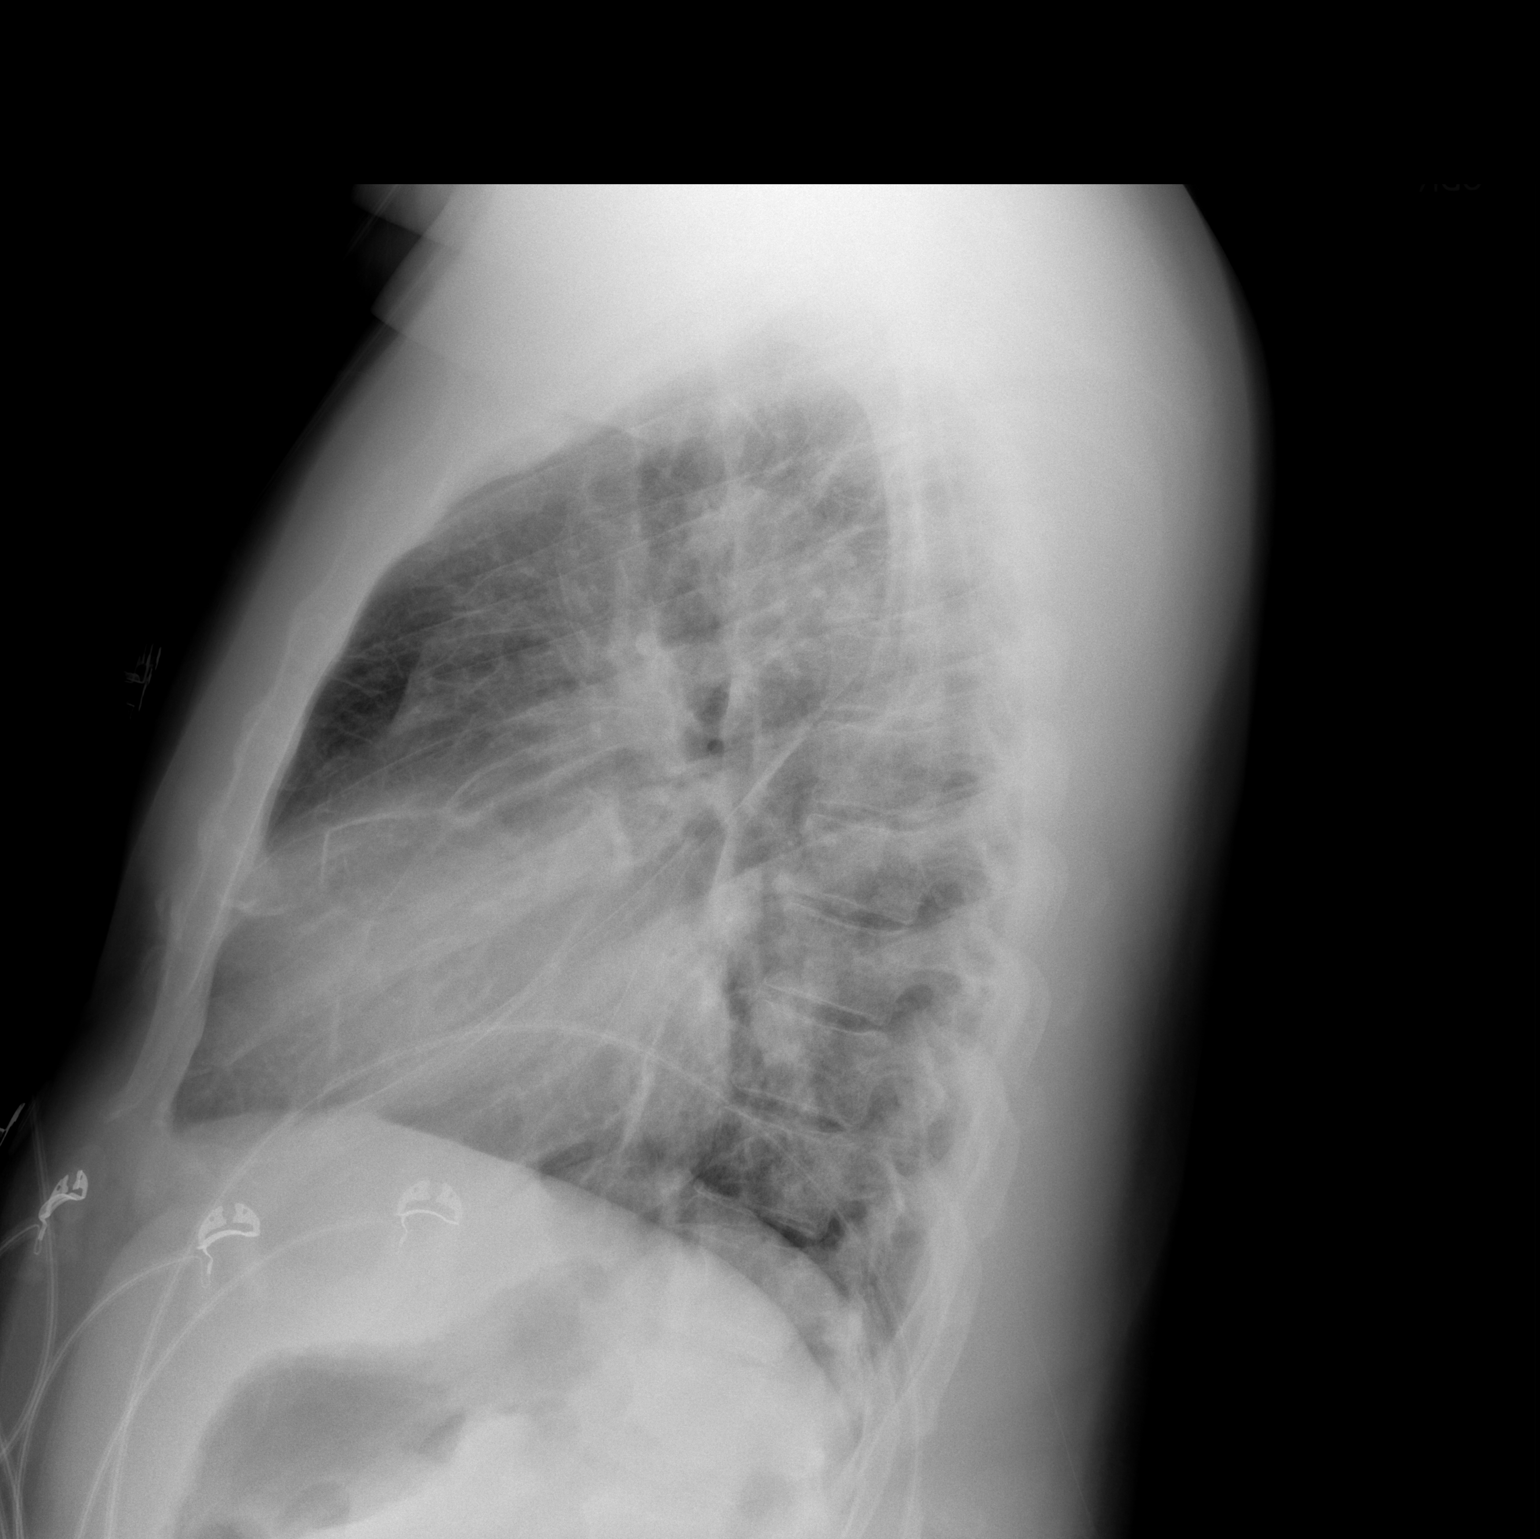

[2 of 2 positions shown; findings below may reference images not displayed]

FINDINGS: Chronic cardiomegaly, unchanged. Stable mediastinal contours.
Minimal septal thickening consistent with mild pulmonary edema.
There is mild scarring in the lingula and right middle lobe. No
acute airspace disease. No pneumothorax or pleural effusion. No
acute osseous abnormalities are seen.
IMPRESSION: Stable cardiomegaly. Mild pulmonary edema but less than on prior
exam.

## 2022-10-30 ENCOUNTER — Encounter: Payer: Self-pay | Admitting: Internal Medicine

## 2022-11-17 NOTE — Telephone Encounter (Signed)
Attempted to call patient and make him aware of his procedure cancellation. Mychart message has been sent with details regarding this. Pt to call back if he has any questions.

## 2022-11-17 NOTE — Telephone Encounter (Signed)
Dr. Leone Payor,   Fayrene Fearing completed his ECHO. EF is 20-25%. Please advise OV or direct to the hospital. I am going to cancel his PV and colonoscopy here at [facility]

## 2022-11-20 ENCOUNTER — Encounter: Payer: Self-pay | Admitting: Adult Health

## 2022-11-20 ENCOUNTER — Ambulatory Visit: Payer: Medicaid Other | Admitting: Adult Health

## 2022-11-20 VITALS — BP 178/104 | HR 86 | Ht 72.0 in | Wt 241.4 lb

## 2022-11-20 DIAGNOSIS — G4733 Obstructive sleep apnea (adult) (pediatric): Secondary | ICD-10-CM | POA: Diagnosis not present

## 2022-11-20 NOTE — Assessment & Plan Note (Signed)
Severe obstructive sleep apnea.  We discussed potential complications of untreated sleep apnea.  Encouraged on CPAP compliance.  Will adjust CPAP pressure for comfort change to auto CPAP 8 to 14 cm H2O. Discussed INSPIRE device   Plan  Patient Instructions  Wear CPAP at bedtime- goal is to wear all night long for at least 6 or more hours Change CPAP to Auto CPAP 8 to 14cm Saline nasal spray Twice daily   Saline nasal gel At bedtime   Work on healthy weight loss Healthy sleep regimen Do not drive if sleepy Follow-up in 3 months and As needed

## 2022-11-20 NOTE — Progress Notes (Signed)
@Patient  ID: Jeffery Glenn, male    DOB: 1970/06/11, 52 y.o.   MRN: [id number]  Chief Complaint  Patient presents with   Follow-up    Referring provider: Norm Salt, [place]  HPI: 52 year old male seen for sleep consult October 24, 2021 for snoring and daytime sleepiness found to have severe obstructive sleep apnea Medical history significant for nonischemic cardiomyopathy, congestive heart failure status post ICD  TEST/EVENTS :  Home sleep study March 26, 2022 showed severe sleep apnea with a AHI at 42.9/hour and SpO2 low at 76%   CPAP titration study June 06/2022 showed optimal control on CPAP pressure 14 cm H2O.  No oxygen was required.  11/20/2022 Follow up ; OSA  Patient returns for 10-month follow-up.  Patient was seen in August 2023 for snoring set up for at home sleep study done in January that showed severe sleep apnea with AHI of 42.9/hour.  Subsequent CPAP titration study was done August 11, 2022 that showed optimal control at CPAP 14 cm H2O.  Patient was started on CPAP therapy last visit.  Patient says he is trying to tolerate it but feels that the pressure is way too high he can wear the machine for about an hour and then he has to take it off because it feels like it is blowing his head off.  He has tried 4 different mask.  He is currently on CPAP 14 cm H2O.  CPAP download shows poor compliance.  Usage at 1 hour.  AHI 1.8/hour.  Allergies  Allergen Reactions   Shellfish Allergy Other (See Comments)    hives   Peanut-Containing Drug Products Other (See Comments)    Allergy test/ Patient eats peanuts    Immunization History  Administered Date(s) Administered   Influenza,inj,Quad PF,6+ Mos 12/06/2020   Influenza-Unspecified 01/28/2018, 10/20/2018   Tdap 08/20/2015    Past Medical History:  Diagnosis Date   Dyspnea on exertion 05/17/2018   Essential hypertension 05/17/2018   HFrEF (heart failure with reduced ejection fraction) (HCC)    Hypertension    ICD  (implantable cardioverter-defibrillator) in place    Mitral regurgitation    NICM (nonischemic cardiomyopathy) (HCC)     Tobacco History: Social History   Tobacco Use  Smoking Status Never   Passive exposure: Past  Smokeless Tobacco Never   Counseling given: Not Answered   Outpatient Medications Prior to Visit  Medication Sig Dispense Refill   atorvastatin (LIPITOR) 20 MG tablet Take 1 tablet (20 mg total) by mouth daily. 90 tablet 3   carvedilol (COREG) 25 MG tablet Take 1.5 tablets (37.5 mg total) by mouth 2 (two) times daily with a meal. 270 tablet 2   dapagliflozin propanediol (FARXIGA) 10 MG TABS tablet Take 1 tablet (10 mg total) by mouth daily before breakfast. 90 tablet 3   furosemide (LASIX) 40 MG tablet Take 1 tablet (40 mg total) by mouth daily as needed for fluid or edema (Weight gain >3 Lbs in 3 days). 90 tablet 2   sacubitril-valsartan (ENTRESTO) 97-103 MG Take 1 tablet by mouth 2 (two) times daily. 60 tablet 0   spironolactone (ALDACTONE) 25 MG tablet Take 0.5 tablets (12.5 mg total) by mouth once for 1 dose. 90 tablet    No facility-administered medications prior to visit.     Review of Systems:   Constitutional:   No  weight loss, night sweats,  Fevers, chills,+ fatigue, or  lassitude.  HEENT:   No headaches,  Difficulty swallowing,  Tooth/dental problems, or  Sore  throat,                No sneezing, itching, ear ache, nasal congestion, post nasal drip,   CV:  No chest pain,  Orthopnea, PND, swelling in lower extremities, anasarca, dizziness, palpitations, syncope.   GI  No heartburn, indigestion, abdominal pain, nausea, vomiting, diarrhea, change in bowel habits, loss of appetite, bloody stools.   Resp: No shortness of breath with exertion or at rest.  No excess mucus, no productive cough,  No non-productive cough,  No coughing up of blood.  No change in color of mucus.  No wheezing.  No chest wall deformity  Skin: no rash or lesions.  GU: no dysuria,  change in color of urine, no urgency or frequency.  No flank pain, no hematuria   MS:  No joint pain or swelling.  No decreased range of motion.  No back pain.    Physical Exam  BP (!) 178/104 (BP Location: Left Arm, Cuff Size: Large)   Pulse 86   Ht 6' (1.829 m)   Wt 241 lb 6.4 oz (109.5 kg)   SpO2 98%   BMI 32.74 kg/m   GEN: A/Ox3; pleasant , NAD, well nourished    HEENT:  [place]/AT,   NOSE-clear, THROAT-clear, no lesions, no postnasal drip or exudate noted.   NECK:  Supple w/ fair ROM; no JVD; normal carotid impulses w/o bruits; no thyromegaly or nodules palpated; no lymphadenopathy.    RESP  Clear  P & A; w/o, wheezes/ rales/ or rhonchi. no accessory muscle use, no dullness to percussion  CARD:  RRR, no m/r/g, no peripheral edema, pulses intact, no cyanosis or clubbing.  GI:   Soft & nt; nml bowel sounds; no organomegaly or masses detected.   Musco: Warm bil, no deformities or joint swelling noted.   Neuro: alert, no focal deficits noted.    Skin: Warm, no lesions or rashes    Lab Results:  CBC    Component Value Date/Time   WBC 7.0 03/09/2022 2024   RBC 5.10 03/09/2022 2024   HGB 15.6 03/09/2022 2055   HGB 12.7 (L) 11/04/2020 0826   HCT 46.0 03/09/2022 2055   HCT 38.7 11/04/2020 0826   PLT 175 03/09/2022 2024   PLT 189 11/04/2020 0826   MCV 82.9 03/09/2022 2024   MCV 81 11/04/2020 0826   MCH 27.1 03/09/2022 2024   MCHC 32.6 03/09/2022 2024   RDW 15.7 (H) 03/09/2022 2024   RDW 14.6 11/04/2020 0826   LYMPHSABS 0.9 03/09/2022 2024   LYMPHSABS 2.8 11/04/2020 0826   MONOABS 0.8 03/09/2022 2024   EOSABS 0.3 03/09/2022 2024   EOSABS 0.5 (H) 11/04/2020 0826   BASOSABS 0.0 03/09/2022 2024   BASOSABS 0.0 11/04/2020 0826    BMET    Component Value Date/Time   NA 139 04/14/2022 1600   K 4.6 04/14/2022 1600   CL 105 04/14/2022 1600   CO2 22 04/14/2022 1600   GLUCOSE 131 (H) 04/14/2022 1600   GLUCOSE 102 (H) 03/09/2022 2024   BUN 13 04/14/2022 1600    CREATININE 0.89 04/14/2022 1600   CALCIUM 9.3 04/14/2022 1600   GFRNONAA >60 03/09/2022 2024   GFRAA >60 08/19/2019 1649    BNP    Component Value Date/Time   BNP 989.6 (H) 03/09/2022 2024    ProBNP    Component Value Date/Time   PROBNP 74 10/06/2018 1404   PROBNP 39.0 01/31/2008 2114    Imaging: No results found.  Administration History  None           No data to display          No results found for: "NITRICOXIDE"      Assessment & Plan:   OSA (obstructive sleep apnea) Severe obstructive sleep apnea.  We discussed potential complications of untreated sleep apnea.  Encouraged on CPAP compliance.  Will adjust CPAP pressure for comfort change to auto CPAP 8 to 14 cm H2O. Discussed INSPIRE device   Plan  Patient Instructions  Wear CPAP at bedtime- goal is to wear all night long for at least 6 or more hours Change CPAP to Auto CPAP 8 to 14cm Saline nasal spray Twice daily   Saline nasal gel At bedtime   Work on healthy weight loss Healthy sleep regimen Do not drive if sleepy Follow-up in 3 months and As needed        [employer], NP 11/20/2022

## 2022-11-20 NOTE — Progress Notes (Signed)
@Patient  ID: Jeffery Glenn, male    DOB: [birth year], 52 y.o.   MRN: [id number]  Chief Complaint  Patient presents with   Follow-up    Referring provider: Norm Salt, PA  HPI:   TEST/EVENTS :  4 mask    Allergies  Allergen Reactions   Shellfish Allergy Other (See Comments)    hives   Peanut-Containing Drug Products Other (See Comments)    Allergy test/ Patient eats peanuts    Immunization History  Administered Date(s) Administered   Influenza,inj,Quad PF,6+ Mos 12/06/2020   Influenza-Unspecified 01/28/2018, 10/20/2018   Tdap 08/20/2015    Past Medical History:  Diagnosis Date   Dyspnea on exertion 05/17/2018   Essential hypertension 05/17/2018   HFrEF (heart failure with reduced ejection fraction) (HCC)    Hypertension    ICD (implantable cardioverter-defibrillator) in place    Mitral regurgitation    NICM (nonischemic cardiomyopathy) (HCC)     Tobacco History: Social History   Tobacco Use  Smoking Status Never   Passive exposure: Past  Smokeless Tobacco Never   Counseling given: Not Answered   Outpatient Medications Prior to Visit  Medication Sig Dispense Refill   atorvastatin (LIPITOR) 20 MG tablet Take 1 tablet (20 mg total) by mouth daily. 90 tablet 3   carvedilol (COREG) 25 MG tablet Take 1.5 tablets (37.5 mg total) by mouth 2 (two) times daily with a meal. 270 tablet 2   dapagliflozin propanediol (FARXIGA) 10 MG TABS tablet Take 1 tablet (10 mg total) by mouth daily before breakfast. 90 tablet 3   furosemide (LASIX) 40 MG tablet Take 1 tablet (40 mg total) by mouth daily as needed for fluid or edema (Weight gain >3 Lbs in 3 days). 90 tablet 2   sacubitril-valsartan (ENTRESTO) 97-103 MG Take 1 tablet by mouth 2 (two) times daily. 60 tablet 0   spironolactone (ALDACTONE) 25 MG tablet Take 0.5 tablets (12.5 mg total) by mouth once for 1 dose. 90 tablet    No facility-administered medications prior to visit.     Review of Systems:    Constitutional:   No  weight loss, night sweats,  Fevers, chills, fatigue, or  lassitude.  HEENT:   No headaches,  Difficulty swallowing,  Tooth/dental problems, or  Sore throat,                No sneezing, itching, ear ache, nasal congestion, post nasal drip,   CV:  No chest pain,  Orthopnea, PND, swelling in lower extremities, anasarca, dizziness, palpitations, syncope.   GI  No heartburn, indigestion, abdominal pain, nausea, vomiting, diarrhea, change in bowel habits, loss of appetite, bloody stools.   Resp: No shortness of breath with exertion or at rest.  No excess mucus, no productive cough,  No non-productive cough,  No coughing up of blood.  No change in color of mucus.  No wheezing.  No chest wall deformity  Skin: no rash or lesions.  GU: no dysuria, change in color of urine, no urgency or frequency.  No flank pain, no hematuria   MS:  No joint pain or swelling.  No decreased range of motion.  No back pain.    Physical Exam  BP (!) 178/104 (BP Location: Left Arm, Cuff Size: Large)   Pulse 86   Ht 6' (1.829 m)   Wt 241 lb 6.4 oz (109.5 kg)   SpO2 98%   BMI 32.74 kg/m   GEN: A/Ox3; pleasant , NAD, well nourished    HEENT:  [place]/AT,  EACs-clear, TMs-wnl, NOSE-clear, THROAT-clear, no lesions, no postnasal drip or exudate noted.   NECK:  Supple w/ fair ROM; no JVD; normal carotid impulses w/o bruits; no thyromegaly or nodules palpated; no lymphadenopathy.    RESP  Clear  P & A; w/o, wheezes/ rales/ or rhonchi. no accessory muscle use, no dullness to percussion  CARD:  RRR, no m/r/g, no peripheral edema, pulses intact, no cyanosis or clubbing.  GI:   Soft & nt; nml bowel sounds; no organomegaly or masses detected.   Musco: Warm bil, no deformities or joint swelling noted.   Neuro: alert, no focal deficits noted.    Skin: Warm, no lesions or rashes    Lab Results:  CBC    Component Value Date/Time   WBC 7.0 03/09/2022 2024   RBC 5.10 03/09/2022 2024   HGB  15.6 03/09/2022 2055   HGB 12.7 (L) 11/04/2020 0826   HCT 46.0 03/09/2022 2055   HCT 38.7 11/04/2020 0826   PLT 175 03/09/2022 2024   PLT 189 11/04/2020 0826   MCV 82.9 03/09/2022 2024   MCV 81 11/04/2020 0826   MCH 27.1 03/09/2022 2024   MCHC 32.6 03/09/2022 2024   RDW 15.7 (H) 03/09/2022 2024   RDW 14.6 11/04/2020 0826   LYMPHSABS 0.9 03/09/2022 2024   LYMPHSABS 2.8 11/04/2020 0826   MONOABS 0.8 03/09/2022 2024   EOSABS 0.3 03/09/2022 2024   EOSABS 0.5 (H) 11/04/2020 0826   BASOSABS 0.0 03/09/2022 2024   BASOSABS 0.0 11/04/2020 0826    BMET    Component Value Date/Time   NA 139 04/14/2022 1600   K 4.6 04/14/2022 1600   CL 105 04/14/2022 1600   CO2 22 04/14/2022 1600   GLUCOSE 131 (H) 04/14/2022 1600   GLUCOSE 102 (H) 03/09/2022 2024   BUN 13 04/14/2022 1600   CREATININE 0.89 04/14/2022 1600   CALCIUM 9.3 04/14/2022 1600   GFRNONAA >60 03/09/2022 2024   GFRAA >60 08/19/2019 1649    BNP    Component Value Date/Time   BNP 989.6 (H) 03/09/2022 2024    ProBNP    Component Value Date/Time   PROBNP 74 10/06/2018 1404   PROBNP 39.0 01/31/2008 2114    Imaging: No results found.  Administration History     None           No data to display          No results found for: "NITRICOXIDE"      Assessment & Plan:   No problem-specific Assessment & Plan notes found for this encounter.     Rubye Oaks, NP 11/20/2022

## 2022-11-20 NOTE — Patient Instructions (Addendum)
Wear CPAP at bedtime- goal is to wear all night long for at least 6 or more hours Change CPAP to Auto CPAP 8 to 14cm Saline nasal spray Twice daily   Saline nasal gel At bedtime   Work on healthy weight loss Healthy sleep regimen Do not drive if sleepy Follow-up in 3 months and As needed

## 2022-11-22 NOTE — Telephone Encounter (Signed)
Please schedule an office visit - next available - not urgent - new or f/u spot

## 2022-11-23 ENCOUNTER — Telehealth: Payer: Self-pay | Admitting: Internal Medicine

## 2022-11-23 ENCOUNTER — Ambulatory Visit (INDEPENDENT_AMBULATORY_CARE_PROVIDER_SITE_OTHER): Payer: Medicaid Other

## 2022-11-23 DIAGNOSIS — I420 Dilated cardiomyopathy: Secondary | ICD-10-CM | POA: Diagnosis not present

## 2022-11-23 DIAGNOSIS — I5020 Unspecified systolic (congestive) heart failure: Secondary | ICD-10-CM

## 2022-11-23 NOTE — Telephone Encounter (Signed)
Inbound call from patient stating he is returning [employer] call. Patient requesting a call back. Please advise, thank you.

## 2022-11-23 NOTE — Telephone Encounter (Signed)
Left message for pt to call back  °

## 2022-11-24 LAB — CUP PACEART REMOTE DEVICE CHECK
Battery Remaining Percentage: 77 %
Date Time Interrogation Session: 20240916154300
HighPow Impedance: 60 Ohm
Implantable Lead Connection Status: 753985
Implantable Lead Implant Date: 20220902
Implantable Lead Location: 753860
Implantable Lead Model: 3501
Implantable Lead Serial Number: 225042
Implantable Pulse Generator Implant Date: 20220902
Pulse Gen Serial Number: 166628

## 2022-11-24 NOTE — Telephone Encounter (Signed)
Left message for pt to call back  °

## 2022-11-25 NOTE — Telephone Encounter (Signed)
Left Message for pt to call back.  After multiple unsuccessful attempts to reach pt by phone  a detailed voice message along with My Chart message was  sent notifying pt to call our office and schedule an office visit with Dr. Leone Payor.

## 2022-11-25 NOTE — Telephone Encounter (Signed)
Pt called back in regard to my chart message that was sent. Pt was scheduled for an office visit on 03/04/2023 at 11:10 AM. Pt made aware. Pt verbalized understanding with all questions answered.

## 2022-12-07 NOTE — Progress Notes (Signed)
Remote ICD transmission.   

## 2022-12-17 ENCOUNTER — Ambulatory Visit: Payer: Self-pay | Admitting: Cardiology

## 2022-12-23 ENCOUNTER — Encounter: Payer: Medicaid Other | Admitting: Internal Medicine

## 2023-02-10 ENCOUNTER — Emergency Department (HOSPITAL_BASED_OUTPATIENT_CLINIC_OR_DEPARTMENT_OTHER)
Admission: EM | Admit: 2023-02-10 | Discharge: 2023-02-11 | Disposition: A | Discharge status: Home / Self Care | Payer: Medicaid Other | Attending: Emergency Medicine | Admitting: Emergency Medicine

## 2023-02-10 ENCOUNTER — Other Ambulatory Visit: Payer: Self-pay

## 2023-02-10 ENCOUNTER — Encounter (HOSPITAL_BASED_OUTPATIENT_CLINIC_OR_DEPARTMENT_OTHER): Payer: Self-pay | Admitting: Emergency Medicine

## 2023-02-10 ENCOUNTER — Emergency Department (HOSPITAL_BASED_OUTPATIENT_CLINIC_OR_DEPARTMENT_OTHER): Payer: Medicaid Other

## 2023-02-10 DIAGNOSIS — I5042 Chronic combined systolic (congestive) and diastolic (congestive) heart failure: Secondary | ICD-10-CM | POA: Diagnosis not present

## 2023-02-10 DIAGNOSIS — I110 Hypertensive heart disease with heart failure: Secondary | ICD-10-CM | POA: Insufficient documentation

## 2023-02-10 DIAGNOSIS — R600 Localized edema: Secondary | ICD-10-CM | POA: Insufficient documentation

## 2023-02-10 DIAGNOSIS — I509 Heart failure, unspecified: Secondary | ICD-10-CM

## 2023-02-10 DIAGNOSIS — Z7722 Contact with and (suspected) exposure to environmental tobacco smoke (acute) (chronic): Secondary | ICD-10-CM | POA: Insufficient documentation

## 2023-02-10 DIAGNOSIS — R0600 Dyspnea, unspecified: Secondary | ICD-10-CM | POA: Diagnosis present

## 2023-02-10 DIAGNOSIS — R0609 Other forms of dyspnea: Secondary | ICD-10-CM | POA: Insufficient documentation

## 2023-02-10 DIAGNOSIS — Z91010 Allergy to peanuts: Secondary | ICD-10-CM | POA: Diagnosis not present

## 2023-02-10 LAB — CBC WITH DIFFERENTIAL/PLATELET
Abs Immature Granulocytes: 0.01 10*3/uL (ref 0.00–0.07)
Basophils Absolute: 0.1 10*3/uL (ref 0.0–0.1)
Basophils Relative: 1 %
Eosinophils Absolute: 0.2 10*3/uL (ref 0.0–0.5)
Eosinophils Relative: 3 %
HCT: 42.1 % (ref 39.0–52.0)
Hemoglobin: 13.7 g/dL (ref 13.0–17.0)
Immature Granulocytes: 0 %
Lymphocytes Relative: 53 %
Lymphs Abs: 2.5 10*3/uL (ref 0.7–4.0)
MCH: 27.3 pg (ref 26.0–34.0)
MCHC: 32.5 g/dL (ref 30.0–36.0)
MCV: 84 fL (ref 80.0–100.0)
Monocytes Absolute: 0.5 10*3/uL (ref 0.1–1.0)
Monocytes Relative: 10 %
Neutro Abs: 1.6 10*3/uL — ABNORMAL LOW (ref 1.7–7.7)
Neutrophils Relative %: 33 %
Platelets: 222 10*3/uL (ref 150–400)
RBC: 5.01 MIL/uL (ref 4.22–5.81)
RDW: 15.1 % (ref 11.5–15.5)
WBC: 4.8 10*3/uL (ref 4.0–10.5)
nRBC: 0 % (ref 0.0–0.2)

## 2023-02-10 LAB — COMPREHENSIVE METABOLIC PANEL
ALT: 15 U/L (ref 0–44)
AST: 17 U/L (ref 15–41)
Albumin: 4.2 g/dL (ref 3.5–5.0)
Alkaline Phosphatase: 37 U/L — ABNORMAL LOW (ref 38–126)
Anion gap: 10 (ref 5–15)
BUN: 16 mg/dL (ref 6–20)
CO2: 26 mmol/L (ref 22–32)
Calcium: 9 mg/dL (ref 8.9–10.3)
Chloride: 102 mmol/L (ref 98–111)
Creatinine, Ser: 0.97 mg/dL (ref 0.61–1.24)
GFR, Estimated: >60 mL/min (ref >60)
Glucose, Bld: 103 mg/dL — ABNORMAL HIGH (ref 70–99)
Potassium: 3.6 mmol/L (ref 3.5–5.1)
Sodium: 138 mmol/L (ref 135–145)
Total Bilirubin: 1 mg/dL (ref <1.2)
Total Protein: 7.7 g/dL (ref 6.5–8.1)

## 2023-02-10 LAB — TROPONIN I (HIGH SENSITIVITY): Troponin I (High Sensitivity): 28 ng/L — ABNORMAL HIGH (ref <18)

## 2023-02-10 LAB — BRAIN NATRIURETIC PEPTIDE: B Natriuretic Peptide: 835.4 pg/mL — ABNORMAL HIGH (ref 0.0–100.0)

## 2023-02-10 MED ORDER — FUROSEMIDE 10 MG/ML IJ SOLN
80.0000 mg | Freq: Once | INTRAMUSCULAR | Status: AC
  Administered 2023-02-10: 80 mg via INTRAVENOUS
  Filled 2023-02-10: qty 8

## 2023-02-10 NOTE — ED Triage Notes (Signed)
Pt w/ increased SHOB over the last 2-3 days; hx of CHF; sts furosemide usually helps, but not helping now; NAD

## 2023-02-10 NOTE — ED Provider Notes (Signed)
[facility] Provider Note  CSN: [id number] Arrival date & time: 02/10/23 2146  Chief Complaint(s) Shortness of Breath  HPI Jeffery Glenn is a 52 y.o. male with past medical history as below, significant for HFrEF, HTN, ICD, mitral regurg, nonischemic cardiomyopathy, OSA who presents to the ED with complaint of dyspnea  Ongoing dyspnea over the past 2 to 3 days, orthopnea, exertional dyspnea.  No chest pain, fevers or chills.  No coughing.  No significant swelling to his extremities.  He takes Lasix as needed, has been taking it last 2 to 3 days with moderate urine output but no significant improvement to his respiratory status.  He is scheduled to see cardiology tomorrow.  Past Medical History Past Medical History:  Diagnosis Date  . Dyspnea on exertion 05/17/2018  . Essential hypertension 05/17/2018  . HFrEF (heart failure with reduced ejection fraction) (HCC)   . Hypertension   . ICD (implantable cardioverter-defibrillator) in place   . Mitral regurgitation   . NICM (nonischemic cardiomyopathy) [facility])    Patient Active Problem List   Diagnosis Date Noted  . OSA (obstructive sleep apnea) 08/20/2022  . Encounter for assessment of implantable cardioverter-defibrillator (ICD) 06/17/2022  . Snoring 10/24/2021  . Near syncope 08/26/2021  . Hypotension 08/25/2021  . ICD ( Sub-Q) [employer] emblem MRI S ICD 11/08/2020 01/16/2021  . Nonischemic cardiomyopathy (HCC) 06/09/2018  . Essential hypertension 05/17/2018  . Heart failure (HCC) 05/13/2018  . Chronic combined systolic and diastolic CHF (congestive heart failure) (HCC) 05/06/2018   Home Medication(s) Prior to Admission medications   Medication Sig Start Date End Date Taking? Authorizing Provider  atorvastatin (LIPITOR) 20 MG tablet Take 1 tablet (20 mg total) by mouth daily. 03/05/20   Georgeanna Lea, MD  carvedilol (COREG) 25 MG tablet Take 1.5 tablets (37.5 mg total) by  mouth 2 (two) times daily with a meal. 09/16/22   Yates Decamp, MD  dapagliflozin propanediol (FARXIGA) 10 MG TABS tablet Take 1 tablet (10 mg total) by mouth daily before breakfast. 04/16/22   Georgeanna Lea, MD  furosemide (LASIX) 40 MG tablet Take 1 tablet (40 mg total) by mouth daily as needed for fluid or edema (Weight gain >3 Lbs in 3 days). 09/16/22   Yates Decamp, MD  sacubitril-valsartan (ENTRESTO) 97-103 MG Take 1 tablet by mouth 2 (two) times daily. 10/13/21   Dunn, Tacey Ruiz, PA-C  spironolactone (ALDACTONE) 25 MG tablet Take 0.5 tablets (12.5 mg total) by mouth once for 1 dose. 06/17/22 06/17/22  Yates Decamp, MD                                                                                                                                    Past Surgical History Past Surgical History:  Procedure Laterality Date  . HAND SURGERY    . NASAL SEPTUM SURGERY    . SUBQ ICD IMPLANT N/A 11/08/2020  Procedure: SUBQ ICD IMPLANT;  Surgeon: Regan Lemming, MD;  Location: [facility] INVASIVE CV LAB;  Service: Cardiovascular;  Laterality: N/A;   Family History Family History  Problem Relation Age of Onset  . Heart attack Mother   . Hypertension Mother   . Heart attack Father   . Hypertension Father   . Heart attack Brother 35       first one    Social History Social History   Tobacco Use  . Smoking status: Never    Passive exposure: Past  . Smokeless tobacco: Never  Vaping Use  . Vaping status: Never Used  Substance Use Topics  . Alcohol use: Yes    Comment: occ  . Drug use: No   Allergies Shellfish allergy and Peanut-containing drug products  Review of Systems Review of Systems  Constitutional:  Negative for chills and fever.  Respiratory:  Positive for shortness of breath. Negative for cough and wheezing.   Cardiovascular:  Negative for chest pain and palpitations.  Gastrointestinal:  Negative for abdominal pain and nausea.  Genitourinary:  Negative for dysuria.  Neurological:   Negative for light-headedness.  All other systems reviewed and are negative.   Physical Exam Vital Signs  I have reviewed the triage vital signs BP (!) 118/96   Pulse 70   Temp 98.6 F (37 C)   Resp 13   Ht 6' (1.829 m)   Wt 97.5 kg   SpO2 96%   BMI 29.16 kg/m  Physical Exam Vitals and nursing note reviewed.  Constitutional:      General: He is not in acute distress.    Appearance: He is well-developed. He is obese. He is not ill-appearing.  HENT:     Head: Normocephalic and atraumatic.     Right Ear: External ear normal.     Left Ear: External ear normal.     Mouth/Throat:     Mouth: Mucous membranes are moist.  Eyes:     General: No scleral icterus. Cardiovascular:     Rate and Rhythm: Normal rate and regular rhythm.     Pulses: Normal pulses.     Heart sounds: Normal heart sounds.  Pulmonary:     Effort: Pulmonary effort is normal. No respiratory distress.     Breath sounds: Decreased breath sounds present.  Abdominal:     General: Abdomen is flat.     Palpations: Abdomen is soft.     Tenderness: There is no abdominal tenderness.  Musculoskeletal:     Cervical back: No rigidity.     Right lower leg: Edema present.     Left lower leg: Edema present.     Comments: Trace edema lower extremities symmetric  Skin:    General: Skin is warm and dry.     Capillary Refill: Capillary refill takes less than 2 seconds.  Neurological:     Mental Status: He is alert.  Psychiatric:        Mood and Affect: Mood normal.        Behavior: Behavior normal.     ED Results and Treatments Labs (all labs ordered are listed, but only abnormal results are displayed) Labs Reviewed  BRAIN NATRIURETIC PEPTIDE - Abnormal; Notable for the following components:      Result Value   B Natriuretic Peptide 835.4 (*)    All other components within normal limits  COMPREHENSIVE METABOLIC PANEL - Abnormal; Notable for the following components:   Glucose, Bld 103 (*)    Alkaline  Phosphatase 37 (*)  All other components within normal limits  CBC WITH DIFFERENTIAL/PLATELET - Abnormal; Notable for the following components:   Neutro Abs 1.6 (*)    All other components within normal limits  TROPONIN I (HIGH SENSITIVITY) - Abnormal; Notable for the following components:   Troponin I (High Sensitivity) 28 (*)    All other components within normal limits  TROPONIN I (HIGH SENSITIVITY) - Abnormal; Notable for the following components:   Troponin I (High Sensitivity) 22 (*)    All other components within normal limits                                                                                                                          Radiology DG Chest 2 View  Result Date: 02/10/2023 CLINICAL DATA:  Shortness of breath EXAM: CHEST - 2 VIEW COMPARISON:  Chest x-ray 03/09/2022, CT chest 03/09/2022 FINDINGS: Left-sided pacing device similar in position. Cardiomegaly with mild central congestion. No focal consolidation, pleural effusion or pneumothorax IMPRESSION: Cardiomegaly with mild central congestion. Electronically Signed   By: Jasmine Pang M.D.   On: 02/10/2023 22:52    Pertinent labs & imaging results that were available during my care of the patient were reviewed by me and considered in my medical decision making (see MDM for details).  Medications Ordered in ED Medications  furosemide (LASIX) injection 80 mg (80 mg Intravenous Given 02/10/23 2353)                                                                                                                                     Procedures .Critical Care  Performed by: Sloan Leiter, DO Authorized by: Sloan Leiter, DO   Critical care provider statement:    Critical care time (minutes):  30   Critical care time was exclusive of:  Separately billable procedures and treating other patients   Critical care was necessary to treat or prevent imminent or life-threatening deterioration of the following conditions:   Cardiac failure   Critical care was time spent personally by me on the following activities:  Development of treatment plan with patient or surrogate, discussions with consultants, evaluation of patient's response to treatment, examination of patient, ordering and review of laboratory studies, ordering and review of radiographic studies, ordering and performing treatments and interventions, pulse oximetry, re-evaluation of patient's condition, review of old charts and obtaining history from patient or surrogate   (  including critical care time)  Medical Decision Making / ED Course    Medical Decision Making:    Kaedin Horkey is a 52 y.o. male with past medical history as below, significant for HFrEF, HTN, ICD, mitral regurg, nonischemic cardiomyopathy, OSA who presents to the ED with complaint of dyspnea. The complaint involves an extensive differential diagnosis and also carries with it a high risk of complications and morbidity.  Serious etiology was considered. Ddx includes but is not limited to: In my evaluation of this patient's dyspnea my DDx includes, but is not limited to, pneumonia, pulmonary embolism, pneumothorax, pulmonary edema, metabolic acidosis, asthma, COPD, cardiac cause, anemia, anxiety, etc.    Complete initial physical exam performed, notably the patient was in no acute distress, no hypoxia.    Reviewed and confirmed nursing documentation for past medical history, family history, social history.  Vital signs reviewed.    Mild CHF exacerbation w/o hypoxia, start IV Lasix and reasonable  Clinical Course as of 02/11/23 0312  Wed Feb 10, 2023  2328 Troponin I (High Sensitivity)(!): 28 Elevated, similar to prior, no ongoing chest pain.  EKG nonischemic [SG]  2329 DG Chest 2 View X-ray consistent with cardiomegaly and mild congestion, no pulm edema [SG]  2329 B Natriuretic Peptide(!): 835.4 Chronically elevated [SG]  2330 Echocardiogram 7/24 with G2 DD, LVEF is 20 to 25%; Dr  Jacinto Halim [SG]  2337 Give lasix and reassess [SG]  Thu Feb 11, 2023  0227 He was able to urinate almost 2 L of urine.  Breathing has improved.  Ambulatory pulse ox without desaturation or significant increased work of breathing.  Reports he feels much better. [SG]  0312 Creatinine: 0.97 Stable Cr  [SG]    Clinical Course User Index [SG] Sloan Leiter, DO    Brief summary:  52 yo male w/ hx as above here with DIB, exam is stable, no hypoxia, favor mild chf exacerbation, see labs above. Feeling better after lasix. Recommend he take his PRN lasix daily next 5 days and f/u with PCP / cardiology  The patient improved significantly and was discharged in stable condition. Detailed discussions were had with the patient regarding current findings, and need for close f/u with PCP or on call doctor. The patient has been instructed to return immediately if the symptoms worsen in any way for re-evaluation. Patient verbalized understanding and is in agreement with current care plan. All questions answered prior to discharge.                 Additional history obtained: -Additional history obtained from spouse -External records from outside source obtained and reviewed including: Chart review including previous notes, labs, imaging, consultation notes including  Echocardiogram 7/24 with G2 DD, LVEF is 20 to 25%; Dr Jacinto Halim   Lab Tests: -I ordered, reviewed, and interpreted labs.   The pertinent results include:   Labs Reviewed  BRAIN NATRIURETIC PEPTIDE - Abnormal; Notable for the following components:      Result Value   B Natriuretic Peptide 835.4 (*)    All other components within normal limits  COMPREHENSIVE METABOLIC PANEL - Abnormal; Notable for the following components:   Glucose, Bld 103 (*)    Alkaline Phosphatase 37 (*)    All other components within normal limits  CBC WITH DIFFERENTIAL/PLATELET - Abnormal; Notable for the following components:   Neutro Abs 1.6 (*)    All  other components within normal limits  TROPONIN I (HIGH SENSITIVITY) - Abnormal; Notable for the following components:  Troponin I (High Sensitivity) 28 (*)    All other components within normal limits  TROPONIN I (HIGH SENSITIVITY) - Abnormal; Notable for the following components:   Troponin I (High Sensitivity) 22 (*)    All other components within normal limits    Notable for trop + BNP +  EKG   EKG Interpretation Date/Time:  Wednesday February 10 2023 21:57:14 EST Ventricular Rate:  66 PR Interval:  191 QRS Duration:  108 QT Interval:  440 QTC Calculation: 461 R Axis:   -61  Text Interpretation: Sinus rhythm LAE, consider biatrial enlargement LAD, consider left anterior fascicular block Abnormal R-wave progression, late transition Left ventricular hypertrophy Abnormal T, consider ischemia, diffuse leads Duplicate EKG Confirmed by Elayne Snare ([id number]) on 02/10/2023 10:32:48 PM         Imaging Studies ordered: I ordered imaging studies including CXR I independently visualized the following imaging with scope of interpretation limited to determining acute life threatening conditions related to emergency care; findings noted above I independently visualized and interpreted imaging. I agree with the radiologist interpretation   Medicines ordered and prescription drug management: Meds ordered this encounter  Medications  . furosemide (LASIX) injection 80 mg    -I have reviewed the patients home medicines and have made adjustments as needed   Consultations Obtained: na   Cardiac Monitoring: The patient was maintained on a cardiac monitor.  I personally viewed and interpreted the cardiac monitored which showed an underlying rhythm of: NSR Continuous pulse oximetry interpreted by myself, 99% on RA.    Social Determinants of Health:  Diagnosis or treatment significantly limited by social determinants of health: obesity   Reevaluation: After the interventions  noted above, I reevaluated the patient and found that they have improved  Co morbidities that complicate the patient evaluation . Past Medical History:  Diagnosis Date  . Dyspnea on exertion 05/17/2018  . Essential hypertension 05/17/2018  . HFrEF (heart failure with reduced ejection fraction) (HCC)   . Hypertension   . ICD (implantable cardioverter-defibrillator) in place   . Mitral regurgitation   . NICM (nonischemic cardiomyopathy) (HCC)       Dispostion: Disposition decision including need for hospitalization was considered, and patient discharged from emergency department.    Final Clinical Impression(s) / ED Diagnoses Final diagnoses:  None        Sloan Leiter, DO 02/11/23 0981

## 2023-02-11 ENCOUNTER — Ambulatory Visit: Payer: Medicaid Other | Attending: Cardiology | Admitting: Cardiology

## 2023-02-11 ENCOUNTER — Encounter: Payer: Self-pay | Admitting: Cardiology

## 2023-02-11 DIAGNOSIS — I5020 Unspecified systolic (congestive) heart failure: Secondary | ICD-10-CM

## 2023-02-11 DIAGNOSIS — I1A0 Resistant hypertension: Secondary | ICD-10-CM

## 2023-02-11 DIAGNOSIS — I428 Other cardiomyopathies: Secondary | ICD-10-CM

## 2023-02-11 DIAGNOSIS — G4733 Obstructive sleep apnea (adult) (pediatric): Secondary | ICD-10-CM

## 2023-02-11 LAB — TROPONIN I (HIGH SENSITIVITY): Troponin I (High Sensitivity): 22 ng/L — ABNORMAL HIGH (ref <18)

## 2023-02-11 NOTE — Progress Notes (Signed)
Patient ambulated around the emergency department.  Patient's heart rate maintained at 76bpm, SpO2 at 97-96% and respiratory rate at 19-20bpm.

## 2023-02-11 NOTE — Discharge Instructions (Addendum)
Please take your lasix daily next 5 days, monitor fluid input, check your weight daily, follow up with PCP and with cardiology  It was a pleasure caring for you today in the emergency department.  Please return to the emergency department for any worsening or worrisome symptoms.

## 2023-02-11 NOTE — Progress Notes (Signed)
Cardiology Office Note:  .   Date:  02/13/2023  ID:  Jeffery Glenn, DOB [birth year], MRN [id number] PCP: Norm Salt, PA  [facility] Providers Cardiologist:  Gypsy Balsam, MD { Click to update primary MD,subspecialty MD or APP then REFRESH:1}  History of Present Illness: .   Jeffery Glenn is a 52 y.o.  African American male, with a significant PMH of nonischemic cardiomyopathy severely reduced left ventricle ejection fraction, essential hypertension, hyperglycemia, severe obstructive sleep apnea (04/2022) has started CPAP therapy and trying to get used to it, SQ ICD present (11/08/2020), who presents to the office for f/u of chronic systolic heart failure due to uncontrolled hypertension.  Seen in the emergency room on 02/10/2023 and received IV Lasix with 2 L diuresis and improvement in dyspnea now presents for follow-up.  Discussed the use of AI scribe software for clinical note transcription with the patient, who gave verbal consent to proceed.  History of Present Illness            ROS  Labs    Lab Results  Component Value Date   NA 138 02/10/2023   K 3.6 02/10/2023   CO2 26 02/10/2023   GLUCOSE 103 (H) 02/10/2023   BUN 16 02/10/2023   CREATININE 0.97 02/10/2023   CALCIUM 9.0 02/10/2023   EGFR 105 04/14/2022   GFRNONAA >60 02/10/2023      Latest Ref Rng & Units 02/10/2023   10:06 PM 04/14/2022    4:00 PM 03/09/2022    8:55 PM  BMP  Glucose 70 - 99 mg/dL 409  811    BUN 6 - 20 mg/dL 16  13    Creatinine 9.14 - 1.24 mg/dL 7.82  9.56    BUN/Creat Ratio 9 - 20  15    Sodium 135 - 145 mmol/L 138  139  139   Potassium 3.5 - 5.1 mmol/L 3.6  4.6  4.0   Chloride 98 - 111 mmol/L 102  105    CO2 22 - 32 mmol/L 26  22    Calcium 8.9 - 10.3 mg/dL 9.0  9.3        Latest Ref Rng & Units 02/10/2023   10:06 PM 03/09/2022    8:55 PM 03/09/2022    8:24 PM  CBC  WBC 4.0 - 10.5 K/uL 4.8   7.0   Hemoglobin 13.0 - 17.0 g/dL 21.3  08.6  57.8   Hematocrit 39.0 - 52.0 % 42.1   46.0  42.3   Platelets 150 - 400 K/uL 222   175    External Labs:  Labs 06/05/2022:   A1c 6.1%.   Hb 14.0/HCT 43.8, platelets 253, normal indicis.   Serum glucose 113 mg, BUN 15, creatinine 0.94, EGFR 99 mL, potassium 4.5, LFTs normal.   Total cholesterol 146, triglycerides 76, HDL 42, LDL 87.  Non-HDL cholesterol 104.  Physical Exam:   VS:  There were no vitals taken for this visit.   Wt Readings from Last 3 Encounters:  02/10/23 215 lb (97.5 kg)  11/20/22 241 lb 6.4 oz (109.5 kg)  09/16/22 235 lb 3.2 oz (106.7 kg)     Physical Exam  Studies Reviewed: .    Echocardiogram 09/14/2022:  Left ventricle cavity is moderately dilated. Dilated cardiomyopathy. Moderate concentric hypertrophy of the left ventricle. Hypokinetic global wall motion. Doppler evidence of grade III (pseudonormal) diastolic dysfunction. Severely depressed LV systolic function with visual EF 20-25%. Calculated EF 28%. Left atrial cavity is severely dilated at 50.7 ml/m^2.  Right ventricle cavity is normal in size. Mildly reduced right ventricular function. Trileaflet aortic valve. Trace aortic regurgitation. Structurally normal mitral valve. Moderate (Grade II) mitral regurgitation. Structurally normal tricuspid valve. Mild tricuspid regurgitation. No evidence of pulmonary hypertension. No significant change from 08/18/2021. Previously grade I diastolic dysfunction now grade III EKG:    EKG 02/10/2023: Normal sinus rhythm at rate of 66 bpm, left atrial enlargement, left anterior fascicular block.  Anteroseptal infarct old.  LVH with repolarization, cannot exclude lateral ischemia.  Compared to 03/09/2022, no significant change, sinus tachycardia not present.  Medications and allergies    Allergies  Allergen Reactions   Shellfish Allergy Other (See Comments)    hives   Peanut-Containing Drug Products Other (See Comments)    Allergy test/ Patient eats peanuts     Current Outpatient Medications:     atorvastatin (LIPITOR) 20 MG tablet, Take 1 tablet (20 mg total) by mouth daily., Disp: 90 tablet, Rfl: 3   carvedilol (COREG) 25 MG tablet, Take 1.5 tablets (37.5 mg total) by mouth 2 (two) times daily with a meal., Disp: 270 tablet, Rfl: 2   dapagliflozin propanediol (FARXIGA) 10 MG TABS tablet, Take 1 tablet (10 mg total) by mouth daily before breakfast., Disp: 90 tablet, Rfl: 3   furosemide (LASIX) 40 MG tablet, Take 1 tablet (40 mg total) by mouth daily as needed for fluid or edema (Weight gain >3 Lbs in 3 days)., Disp: 90 tablet, Rfl: 2   sacubitril-valsartan (ENTRESTO) 97-103 MG, Take 1 tablet by mouth 2 (two) times daily., Disp: 60 tablet, Rfl: 0   spironolactone (ALDACTONE) 25 MG tablet, Take 0.5 tablets (12.5 mg total) by mouth once for 1 dose., Disp: 90 tablet, Rfl:    ASSESSMENT AND PLAN: .      ICD-10-CM   1. HFrEF (heart failure with reduced ejection fraction) (HCC)  I50.20     2. Nonischemic cardiomyopathy (HCC)  I42.8     3. Resistant hypertension  I1A.0     4. OSA (obstructive sleep apnea)  G47.33       1. HFrEF (heart failure with reduced ejection fraction) (HCC) ***  2. Nonischemic cardiomyopathy (HCC) ***  3. Resistant hypertension ***  4. OSA (obstructive sleep apnea) ***  Assessment and Plan                {Are you ordering a CV Procedure (e.g. stress test, cath, DCCV, TEE, etc)?   Press F2        :[id number]}   Signed,  Jeffery Decamp, MD, [facility] 02/13/2023, 5:20 AM [facility] [address] [place], [place] [address] Phone: [phone]. Fax:  [phone]

## 2023-02-18 ENCOUNTER — Telehealth: Payer: Self-pay | Admitting: Cardiology

## 2023-02-18 NOTE — Telephone Encounter (Signed)
I spoke with patient's wife.  He missed recent visit due to being in the ED very late the day before. He was advised by ED to follow up with cardiology prior to his next PCP visit.  Wife reports patient is still having shortness of breath at times.  Appointment made for patient to see Robin Searing, NP tomorrow at 8 AM

## 2023-02-18 NOTE — Telephone Encounter (Signed)
Patient was in the ER on 12/4 for ongoing dyspnea over the past 2 to 3 days, orthopnea, exertional dyspnea.  No chest pain, fevers or chills. Wanted to let Dr. Jacinto Halim and nurse know and to call back to discuss. Patient no showed appt on 12/5

## 2023-02-19 ENCOUNTER — Ambulatory Visit: Payer: Medicaid Other | Admitting: Nurse Practitioner

## 2023-02-19 NOTE — Progress Notes (Unsigned)
Cardiology Office Note    Patient Name: Jeffery Glenn Date of Encounter: 02/19/2023  Primary Care Provider:  Norm Salt, PA Primary Cardiologist:  Gypsy Balsam, MD Primary Electrophysiologist: None   Past Medical History    Past Medical History:  Diagnosis Date   Dyspnea on exertion 05/17/2018   Essential hypertension 05/17/2018   HFrEF (heart failure with reduced ejection fraction) (HCC)    Hypertension    ICD (implantable cardioverter-defibrillator) in place    Mitral regurgitation    NICM (nonischemic cardiomyopathy) (HCC)     History of Present Illness  Jeffery Glenn is a 52 y.o. male with a PMH of chronic combined CHF, NICM s/p Sub Q ICD 11/2020, HTN, HLD, OSA, mild to moderate MR who presents today for follow-up visit.  Jeffery Glenn was seen initially by Dr. Bing Matter 2020 by referral from PCP for complaint of shortness of breath.  He underwent a 2D echo that revealed EF of 25 to 30%.  He was initiated with GDMT percent.  He underwent a coronary CT 2021 that showed no coronary artery disease.  He was admitted 08/2020 for near syncope and found to have hypotension.  Felt probably from overdiuresis.  Echocardiogram without significant changes.  His amlodipine 10 mg daily, Entresto 97/103 mg twice daily, spironolactone 25 mg daily, isosorbide 60 mg daily and hydralazine 50 mg 3 times daily were discontinued with improved blood pressure.   He was referred to Dr. Elberta Fortis on 10/2020 and underwent subcutaneous ICD placement on 11/2020.  He established care with Dr. Jacinto Halim in 06/2022 and was last seen in office on 09/2022 for follow-up visit.  He was hypertensive during visit and carvedilol was increased to 37.5 mg twice daily.  He reported being extremely thirsty was advised to change Lasix to as needed.  He also noted having problems with his CPAP mask and was advised to adhere to strict adherence.  Jeffery Glenn contacted our office on 02/10/2023 after being seen in the ER for ongoing dyspnea  and shortness of breath.  He was found to have no significant swelling to lower extremities reported taking Lasix as needed with moderate urinary output.  He was started on IV Lasix BNP was completed and elevated at 835 and troponins mildly elevated at 28 with no report of chest pain.  He was able to urinate 2 L of urine with improvement to breathing.  He was discharged with instructions to take as needed Lasix x 5 days and follow-up with cardiology.  During today's visit the patient reports*** .  Patient denies chest pain, palpitations, dyspnea, PND, orthopnea, nausea, vomiting, dizziness, syncope, edema, weight gain, or early satiety.  ***Notes: -Last ischemic evaluation: -Last echo: -Interim ED visits: Review of Systems  Please see the history of present illness.    All other systems reviewed and are otherwise negative except as noted above.  Physical Exam    Wt Readings from Last 3 Encounters:  02/10/23 215 lb (97.5 kg)  11/20/22 241 lb 6.4 oz (109.5 kg)  09/16/22 235 lb 3.2 oz (106.7 kg)   WU:JWJXB were no vitals filed for this visit.,There is no height or weight on file to calculate BMI. GEN: Well nourished, well developed in no acute distress Neck: No JVD; No carotid bruits Pulmonary: Clear to auscultation without rales, wheezing or rhonchi  Cardiovascular: Normal rate. Regular rhythm. Normal S1. Normal S2.   Murmurs: There is no murmur.  ABDOMEN: Soft, non-tender, non-distended EXTREMITIES:  No edema; No deformity   EKG/LABS/ Recent Cardiac  Studies   ECG personally reviewed by me today - ***  Risk Assessment/Calculations:   {Does this patient have ATRIAL FIBRILLATION?:[id number]}      Lab Results  Component Value Date   WBC 4.8 02/10/2023   HGB 13.7 02/10/2023   HCT 42.1 02/10/2023   MCV 84.0 02/10/2023   PLT 222 02/10/2023   Lab Results  Component Value Date   CREATININE 0.97 02/10/2023   BUN 16 02/10/2023   NA 138 02/10/2023   K 3.6 02/10/2023   CL 102  02/10/2023   CO2 26 02/10/2023   Lab Results  Component Value Date   CHOL 184 01/31/2008   HDL 52 01/31/2008   LDLCALC 120 (H) 01/31/2008   TRIG 62 01/31/2008   CHOLHDL 3.5 Ratio 01/31/2008    No results found for: "HGBA1C" Assessment & Plan    1.  Chronic combined CHF:  2.  NICM: -s/p (Sub-Q) ICD placed 11/2020 currently followed by Dr. Elberta Fortis 3.  Essential hypertension  4.  OSA:  5.  Shortness of breath:      Disposition: Follow-up with Gypsy Balsam, MD or APP in *** months {Are you ordering a CV Procedure (e.g. stress test, cath, DCCV, TEE, etc)?   Press F2        :[id number]}   Signed, Napoleon Form, Leodis Rains, NP 02/19/2023, 7:26 AM [facility]

## 2023-02-22 ENCOUNTER — Ambulatory Visit: Payer: Medicaid Other | Admitting: Adult Health

## 2023-02-22 ENCOUNTER — Ambulatory Visit (INDEPENDENT_AMBULATORY_CARE_PROVIDER_SITE_OTHER): Payer: 59

## 2023-02-22 DIAGNOSIS — I5020 Unspecified systolic (congestive) heart failure: Secondary | ICD-10-CM | POA: Diagnosis not present

## 2023-02-22 DIAGNOSIS — I428 Other cardiomyopathies: Secondary | ICD-10-CM

## 2023-02-24 LAB — CUP PACEART REMOTE DEVICE CHECK
Battery Remaining Percentage: 74 %
Date Time Interrogation Session: 20241218104800
HighPow Impedance: 60 Ohm
Implantable Lead Connection Status: 753985
Implantable Lead Implant Date: 20220902
Implantable Lead Location: 753860
Implantable Lead Model: 3501
Implantable Lead Serial Number: 225042
Implantable Pulse Generator Implant Date: 20220902
Pulse Gen Serial Number: 166628

## 2023-03-01 DIAGNOSIS — I340 Nonrheumatic mitral (valve) insufficiency: Secondary | ICD-10-CM | POA: Insufficient documentation

## 2023-03-01 NOTE — Assessment & Plan Note (Signed)
EF 20-25. Non-ischemic cardiomyopathy. NYHA II-IIb. Volume status stable.  GDMT: Coreg 37.5, Farxiga 10, Entresto 97/103, Spironolactone 12.5 He has had issues with hypotension in the past. He was previously on hydralazine/nitrates.  These were discontinued.  He has been managing fluid with taking furosemide about every other day. -Continue carvedilol 37.5 mg twice daily, Farxiga 10 mg daily, Entresto 97/23 mg twice daily. -Increase spironolactone 25 mg daily -BMET today, repeat 1 week -Continue furosemide as needed.  We discussed the importance of daily weights. -Follow-up 3 months

## 2023-03-01 NOTE — Assessment & Plan Note (Signed)
BP controlled.  Continue hydralazine 50 mg 3 times a day, isosorbide dinitrate 30 mg twice daily, Entresto 24/26 mg twice daily, spironolactone 25 mg daily

## 2023-03-01 NOTE — Progress Notes (Unsigned)
Cardiology Office Note:    Date:  03/02/2023  ID:  Jeffery Glenn, DOB 05-Oct-1970, MRN [id number] PCP: Norm Salt, PA  [facility] Providers Cardiologist:  Yates Decamp, MD       Patient Profile:      (HFrEF) heart failure with reduced ejection fraction Non-ischemic cardiomyopathy  CCTA 06/15/19: CAC score 0, no CAD  TTE 09/14/22: mod conc LVHGr 3 DD, EF 20-25, severe LAE, mildly reduced RVSF, trace AI, mod MR, mild TR S/p SQ ICD Hypertension  Mitral regurgitation  OSA        History of Present Illness:  Discussed the use of AI scribe software for clinical note transcription with the patient, who gave verbal consent to proceed.  Jeffery Glenn is a 52 y.o. male who returns for post ED follow up. He was seen in the ED 02/09/23 with exacerbation of CHF. His BNP was 835. CXR showed vascular congestion. His hsT were mildly elevated and flat (28>>22), not c/w ACS. EKG was personally reviewed and showed NSR, inf-lat TW inversions similar to old tracings.  He is here alone.  He notes episodes of shortness of breath at times with moderate activity.  He has not had orthopnea.  He has not had symptoms of PND in over a week.  He does not weigh on a regular basis.  He has not had chest pain, syncope.  He has not had significant edema.  He has had some sinus drainage recently as well as lightheadedness but no near syncope.  He has not had any palpitations.  He has been managing fluid with furosemide approximately every other day.      Review of Systems  Constitutional: Negative for fever.  Respiratory:  Negative for cough.   Gastrointestinal:  Negative for hematochezia and melena.  Genitourinary:  Negative for hematuria.  See HPI     Studies Reviewed:             Risk Assessment/Calculations:     HYPERTENSION CONTROL Vitals:   03/02/23 0758 03/02/23 0827  BP: (!) 170/80 (!) 140/100    The patient's blood pressure is elevated above target today.  In order to address the patient's  elevated BP: A current anti-hypertensive medication was adjusted today.; Blood pressure will be monitored at home to determine if medication changes need to be made.       Physical Exam:   VS:  BP (!) 140/100   Pulse 64   Ht 6' (1.829 m)   Wt 237 lb 12.8 oz (107.9 kg)   SpO2 98%   BMI 32.25 kg/m    Wt Readings from Last 3 Encounters:  03/02/23 237 lb 12.8 oz (107.9 kg)  02/10/23 215 lb (97.5 kg)  11/20/22 241 lb 6.4 oz (109.5 kg)    Constitutional:      Appearance: Healthy appearance. Not in distress.  Neck:     Vascular: No JVR. JVD normal.  Pulmonary:     Breath sounds: Normal breath sounds. No wheezing. No rales.  Cardiovascular:     Normal rate. Regular rhythm.     Murmurs: There is no murmur.  Edema:    Peripheral edema absent.  Abdominal:     Palpations: Abdomen is soft.        Assessment and Plan:   Assessment & Plan HFrEF (heart failure with reduced ejection fraction) (HCC) EF 20-25. Non-ischemic cardiomyopathy. NYHA II-IIb. Volume status stable.  GDMT: Coreg 37.5, Farxiga 10, Entresto 97/103, Spironolactone 12.5 He has had issues with  hypotension in the past. He was previously on hydralazine/nitrates.  These were discontinued.  He has been managing fluid with taking furosemide about every other day. -Continue carvedilol 37.5 mg twice daily, Farxiga 10 mg daily, Entresto 97/23 mg twice daily. -Increase spironolactone 25 mg daily -BMET today, repeat 1 week -Continue furosemide as needed.  We discussed the importance of daily weights. -Follow-up 3 months Essential hypertension BP uncontrolled.  He has not had medications yet today. -Continue carvedilol 37.5 mg twice daily, Entresto 97/103 mg twice daily -Increase spironolactone to 25 mg daily -Monitor blood pressure for 2 weeks and send readings for review Nonrheumatic mitral valve regurgitation Mod MR by TTE in 09/2022. Will need to continue surveillance with annual echocardiograms.  Obesity (BMI 30-39.9) He  has been to a weight loss clinic and is interested in GLP-1 agonists.  We briefly discussed the benefits of these drugs.      Dispo:  Return in about 3 months (around 05/31/2023) for Routine follow up in 3 months with Dr. Jacinto Halim.  Signed, Tereso Newcomer, PA-C

## 2023-03-01 NOTE — Assessment & Plan Note (Signed)
Mod MR by TTE in 09/2022. Will need to continue surveillance with annual echocardiograms. ***

## 2023-03-02 ENCOUNTER — Ambulatory Visit: Payer: Medicaid Other | Attending: Physician Assistant | Admitting: Physician Assistant

## 2023-03-02 ENCOUNTER — Encounter: Payer: Self-pay | Admitting: Physician Assistant

## 2023-03-02 VITALS — BP 140/100 | HR 64 | Ht 72.0 in | Wt 237.8 lb

## 2023-03-02 DIAGNOSIS — I5020 Unspecified systolic (congestive) heart failure: Secondary | ICD-10-CM | POA: Diagnosis not present

## 2023-03-02 DIAGNOSIS — E669 Obesity, unspecified: Secondary | ICD-10-CM | POA: Diagnosis not present

## 2023-03-02 DIAGNOSIS — I10 Essential (primary) hypertension: Secondary | ICD-10-CM

## 2023-03-02 DIAGNOSIS — I340 Nonrheumatic mitral (valve) insufficiency: Secondary | ICD-10-CM

## 2023-03-02 LAB — BASIC METABOLIC PANEL
BUN/Creatinine Ratio: 12 (ref 9–20)
BUN: 12 mg/dL (ref 6–24)
CO2: 25 mmol/L (ref 20–29)
Calcium: 9.4 mg/dL (ref 8.7–10.2)
Chloride: 104 mmol/L (ref 96–106)
Creatinine, Ser: 1.02 mg/dL (ref 0.76–1.27)
Glucose: 116 mg/dL — ABNORMAL HIGH (ref 70–99)
Potassium: 4.2 mmol/L (ref 3.5–5.2)
Sodium: 143 mmol/L (ref 134–144)
eGFR: 90 mL/min/{1.73_m2} (ref >59)

## 2023-03-02 MED ORDER — SPIRONOLACTONE 25 MG PO TABS: ORAL_TABLET | ORAL | Status: AC

## 2023-03-02 NOTE — Patient Instructions (Signed)
Medication Instructions:   INCREASE Aldactone one (1) tablet by mouth ( 25 mg) daily.  *If you need a refill on your cardiac medications before your next appointment, please call your pharmacy*   Lab Work:  TODAY!!!!! BMET  One week BMET.  If you have labs (blood work) drawn today and your tests are completely normal, you will receive your results only by: MyChart Message (if you have MyChart) OR A paper copy in the mail If you have any lab test that is abnormal or we need to change your treatment, we will call you to review the results.   Testing/Procedures:  None ordered.   Follow-Up: At [facility], you and your health needs are our priority.  As part of our continuing mission to provide you with exceptional heart care, we have created designated Provider Care Teams.  These Care Teams include your primary Cardiologist (physician) and Advanced Practice Providers (APPs -  Physician Assistants and Nurse Practitioners) who all work together to provide you with the care you need, when you need it.  We recommend signing up for the patient portal called "MyChart".  Sign up information is provided on this After Visit Summary.  MyChart is used to connect with patients for Virtual Visits (Telemedicine).  Patients are able to view lab/test results, encounter notes, upcoming appointments, etc.  Non-urgent messages can be sent to your provider as well.   To learn more about what you can do with MyChart, go to [email or web address].    Your next appointment:   3 month(s)  Provider:   Yates Decamp, MD    Other Instructions  It is ok to take a GLP-1 agonist like ozempic.   Please send BP readings in two weeks call at [phone] and ask for triage or you can send mychart message.   Blood Pressure Record Sheet To take your blood pressure, you will need a blood pressure machine. You may be prescribed one, or you can buy a blood pressure machine (blood pressure monitor) at your  clinic, drug store, or online. When choosing one, look for these features: An automatic monitor that has an arm cuff. A cuff that wraps snugly, but not too tightly, around your upper arm. You should be able to fit only one finger between your arm and the cuff. A device that stores blood pressure reading results. Do not choose a monitor that measures your blood pressure from your wrist or finger. Follow your health care provider's instructions for how to take your blood pressure. To use this form: Get one reading in the morning (a.m.) before you take any medicines. Get one reading in the evening (p.m.) before supper. Take at least two readings with each blood pressure check. This makes sure the results are correct. Wait 1-2 minutes between measurements. Write down the results in the spaces on this form. Repeat this once a week, or as told by your health care provider. Make a follow-up appointment with your health care provider to discuss the results. Blood pressure log Date: _______________________ a.m. _____________________(1st reading) _____________________(2nd reading) p.m. _____________________(1st reading) _____________________(2nd reading) Date: _______________________ a.m. _____________________(1st reading) _____________________(2nd reading) p.m. _____________________(1st reading) _____________________(2nd reading) Date: _______________________ a.m. _____________________(1st reading) _____________________(2nd reading) p.m. _____________________(1st reading) _____________________(2nd reading) Date: _______________________ a.m. _____________________(1st reading) _____________________(2nd reading) p.m. _____________________(1st reading) _____________________(2nd reading) Date: _______________________ a.m. _____________________(1st reading) _____________________(2nd reading) p.m. _____________________(1st reading) _____________________(2nd reading) This information is not intended to  replace advice given to you by your health care provider. Make sure you discuss any questions  you have with your health care provider. Document Revised: 11/07/2020 Document Reviewed: 11/07/2020 Elsevier Patient Education  2024 [employer].

## 2023-03-04 ENCOUNTER — Ambulatory Visit: Payer: Medicaid Other | Admitting: Internal Medicine

## 2023-03-29 NOTE — Progress Notes (Signed)
Remote ICD transmission.   

## 2023-03-29 NOTE — Addendum Note (Signed)
Addended by: Geralyn Flash D on: 03/29/2023 01:50 PM   Modules accepted: Orders

## 2023-04-13 ENCOUNTER — Ambulatory Visit: Payer: Medicaid Other | Admitting: Adult Health

## 2023-04-15 ENCOUNTER — Encounter (HOSPITAL_BASED_OUTPATIENT_CLINIC_OR_DEPARTMENT_OTHER): Payer: Self-pay

## 2023-04-15 ENCOUNTER — Other Ambulatory Visit: Payer: Self-pay

## 2023-04-15 ENCOUNTER — Inpatient Hospital Stay (HOSPITAL_BASED_OUTPATIENT_CLINIC_OR_DEPARTMENT_OTHER)
Admission: EM | Admit: 2023-04-15 | Discharge: 2023-04-18 | DRG: 871 | Disposition: A | Discharge status: Home / Self Care | Payer: Medicaid Other | Attending: Internal Medicine | Admitting: Internal Medicine

## 2023-04-15 ENCOUNTER — Emergency Department (HOSPITAL_BASED_OUTPATIENT_CLINIC_OR_DEPARTMENT_OTHER): Payer: Medicaid Other

## 2023-04-15 DIAGNOSIS — A4189 Other specified sepsis: Secondary | ICD-10-CM | POA: Diagnosis present

## 2023-04-15 DIAGNOSIS — Z886 Allergy status to analgesic agent status: Secondary | ICD-10-CM | POA: Diagnosis not present

## 2023-04-15 DIAGNOSIS — M791 Myalgia, unspecified site: Secondary | ICD-10-CM | POA: Diagnosis present

## 2023-04-15 DIAGNOSIS — R6520 Severe sepsis without septic shock: Secondary | ICD-10-CM

## 2023-04-15 DIAGNOSIS — I5082 Biventricular heart failure: Secondary | ICD-10-CM | POA: Diagnosis present

## 2023-04-15 DIAGNOSIS — J111 Influenza due to unidentified influenza virus with other respiratory manifestations: Secondary | ICD-10-CM

## 2023-04-15 DIAGNOSIS — J9601 Acute respiratory failure with hypoxia: Secondary | ICD-10-CM

## 2023-04-15 DIAGNOSIS — Z8249 Family history of ischemic heart disease and other diseases of the circulatory system: Secondary | ICD-10-CM

## 2023-04-15 DIAGNOSIS — Z79899 Other long term (current) drug therapy: Secondary | ICD-10-CM | POA: Diagnosis not present

## 2023-04-15 DIAGNOSIS — J101 Influenza due to other identified influenza virus with other respiratory manifestations: Secondary | ICD-10-CM | POA: Diagnosis present

## 2023-04-15 DIAGNOSIS — D72819 Decreased white blood cell count, unspecified: Secondary | ICD-10-CM | POA: Diagnosis present

## 2023-04-15 DIAGNOSIS — Z885 Allergy status to narcotic agent status: Secondary | ICD-10-CM

## 2023-04-15 DIAGNOSIS — I428 Other cardiomyopathies: Secondary | ICD-10-CM | POA: Diagnosis present

## 2023-04-15 DIAGNOSIS — G4733 Obstructive sleep apnea (adult) (pediatric): Secondary | ICD-10-CM | POA: Diagnosis present

## 2023-04-15 DIAGNOSIS — I110 Hypertensive heart disease with heart failure: Secondary | ICD-10-CM | POA: Diagnosis present

## 2023-04-15 DIAGNOSIS — Z95810 Presence of automatic (implantable) cardiac defibrillator: Secondary | ICD-10-CM | POA: Diagnosis not present

## 2023-04-15 DIAGNOSIS — A419 Sepsis, unspecified organism: Secondary | ICD-10-CM | POA: Diagnosis not present

## 2023-04-15 DIAGNOSIS — I5042 Chronic combined systolic (congestive) and diastolic (congestive) heart failure: Secondary | ICD-10-CM | POA: Diagnosis present

## 2023-04-15 DIAGNOSIS — Z1152 Encounter for screening for COVID-19: Secondary | ICD-10-CM | POA: Diagnosis not present

## 2023-04-15 DIAGNOSIS — Z91013 Allergy to seafood: Secondary | ICD-10-CM | POA: Diagnosis not present

## 2023-04-15 DIAGNOSIS — I340 Nonrheumatic mitral (valve) insufficiency: Secondary | ICD-10-CM | POA: Diagnosis present

## 2023-04-15 DIAGNOSIS — R509 Fever, unspecified: Secondary | ICD-10-CM

## 2023-04-15 DIAGNOSIS — I10 Essential (primary) hypertension: Secondary | ICD-10-CM | POA: Diagnosis not present

## 2023-04-15 DIAGNOSIS — Z91010 Allergy to peanuts: Secondary | ICD-10-CM

## 2023-04-15 DIAGNOSIS — R059 Cough, unspecified: Secondary | ICD-10-CM

## 2023-04-15 HISTORY — DX: Acute respiratory failure with hypoxia: J96.01

## 2023-04-15 HISTORY — DX: Sepsis, unspecified organism: R65.20

## 2023-04-15 HISTORY — DX: Severe sepsis without septic shock: A41.9

## 2023-04-15 LAB — BASIC METABOLIC PANEL
Anion gap: 11 (ref 5–15)
BUN: 14 mg/dL (ref 6–20)
CO2: 23 mmol/L (ref 22–32)
Calcium: 8.7 mg/dL — ABNORMAL LOW (ref 8.9–10.3)
Chloride: 100 mmol/L (ref 98–111)
Creatinine, Ser: 0.93 mg/dL (ref 0.61–1.24)
GFR, Estimated: >60 mL/min (ref >60)
Glucose, Bld: 113 mg/dL — ABNORMAL HIGH (ref 70–99)
Potassium: 3.9 mmol/L (ref 3.5–5.1)
Sodium: 134 mmol/L — ABNORMAL LOW (ref 135–145)

## 2023-04-15 LAB — CBC WITH DIFFERENTIAL/PLATELET
Abs Immature Granulocytes: 0.02 10*3/uL (ref 0.00–0.07)
Basophils Absolute: 0 10*3/uL (ref 0.0–0.1)
Basophils Relative: 0 %
Eosinophils Absolute: 0 10*3/uL (ref 0.0–0.5)
Eosinophils Relative: 0 %
HCT: 40.4 % (ref 39.0–52.0)
Hemoglobin: 13.2 g/dL (ref 13.0–17.0)
Immature Granulocytes: 0 %
Lymphocytes Relative: 7 %
Lymphs Abs: 0.4 10*3/uL — ABNORMAL LOW (ref 0.7–4.0)
MCH: 26.9 pg (ref 26.0–34.0)
MCHC: 32.7 g/dL (ref 30.0–36.0)
MCV: 82.3 fL (ref 80.0–100.0)
Monocytes Absolute: 0.8 10*3/uL (ref 0.1–1.0)
Monocytes Relative: 13 %
Neutro Abs: 4.9 10*3/uL (ref 1.7–7.7)
Neutrophils Relative %: 80 %
Platelets: 148 10*3/uL — ABNORMAL LOW (ref 150–400)
RBC: 4.91 MIL/uL (ref 4.22–5.81)
RDW: 15.1 % (ref 11.5–15.5)
WBC: 6.2 10*3/uL (ref 4.0–10.5)
nRBC: 0 % (ref 0.0–0.2)

## 2023-04-15 LAB — LACTIC ACID, PLASMA
Lactic Acid, Venous: 1.5 mmol/L (ref 0.5–1.9)
Lactic Acid, Venous: 1.5 mmol/L (ref 0.5–1.9)

## 2023-04-15 LAB — BLOOD GAS, VENOUS
Acid-Base Excess: 2.7 mmol/L — ABNORMAL HIGH (ref 0.0–2.0)
Bicarbonate: 27.9 mmol/L (ref 20.0–28.0)
O2 Saturation: 91.4 %
Patient temperature: 37
pCO2, Ven: 44 mm[Hg] (ref 44–60)
pH, Ven: 7.41 (ref 7.25–7.43)
pO2, Ven: 59 mm[Hg] — ABNORMAL HIGH (ref 32–45)

## 2023-04-15 LAB — MAGNESIUM: Magnesium: 1.7 mg/dL (ref 1.7–2.4)

## 2023-04-15 LAB — RESP PANEL BY RT-PCR (RSV, FLU A&B, COVID)  RVPGX2
Influenza A by PCR: POSITIVE — AB
Influenza B by PCR: NEGATIVE
Resp Syncytial Virus by PCR: NEGATIVE
SARS Coronavirus 2 by RT PCR: NEGATIVE

## 2023-04-15 LAB — PROCALCITONIN: Procalcitonin: 0.49 ng/mL

## 2023-04-15 LAB — BRAIN NATRIURETIC PEPTIDE: B Natriuretic Peptide: 1765.1 pg/mL — ABNORMAL HIGH (ref 0.0–100.0)

## 2023-04-15 MED ORDER — CARVEDILOL 25 MG PO TABS
25.0000 mg | ORAL_TABLET | Freq: Once | ORAL | Status: AC
  Administered 2023-04-15: 25 mg via ORAL
  Filled 2023-04-15: qty 1

## 2023-04-15 MED ORDER — SODIUM CHLORIDE 0.9 % IV BOLUS: 1000.0000 mL | Freq: Once | INTRAVENOUS | Status: DC

## 2023-04-15 MED ORDER — DAPAGLIFLOZIN PROPANEDIOL 10 MG PO TABS
10.0000 mg | ORAL_TABLET | Freq: Every day | ORAL | Status: DC
  Administered 2023-04-16 – 2023-04-18 (×3): 10 mg via ORAL
  Filled 2023-04-15 (×4): qty 1

## 2023-04-15 MED ORDER — LABETALOL HCL 5 MG/ML IV SOLN
10.0000 mg | Freq: Once | INTRAVENOUS | Status: AC
  Administered 2023-04-15: 10 mg via INTRAVENOUS
  Filled 2023-04-15: qty 4

## 2023-04-15 MED ORDER — MELATONIN 3 MG PO TABS: 3.0000 mg | ORAL_TABLET | Freq: Every evening | ORAL | Status: DC | PRN

## 2023-04-15 MED ORDER — ATORVASTATIN CALCIUM 10 MG PO TABS
20.0000 mg | ORAL_TABLET | Freq: Every day | ORAL | Status: DC
  Administered 2023-04-15 – 2023-04-18 (×4): 20 mg via ORAL
  Filled 2023-04-15 (×4): qty 2

## 2023-04-15 MED ORDER — OSELTAMIVIR PHOSPHATE 75 MG PO CAPS
75.0000 mg | ORAL_CAPSULE | Freq: Once | ORAL | Status: AC
  Administered 2023-04-15: 75 mg via ORAL
  Filled 2023-04-15: qty 1

## 2023-04-15 MED ORDER — SPIRONOLACTONE 25 MG PO TABS
25.0000 mg | ORAL_TABLET | Freq: Every day | ORAL | Status: DC
  Administered 2023-04-16 – 2023-04-18 (×3): 25 mg via ORAL
  Filled 2023-04-15 (×3): qty 1

## 2023-04-15 MED ORDER — SACUBITRIL-VALSARTAN 97-103 MG PO TABS
1.0000 | ORAL_TABLET | Freq: Two times a day (BID) | ORAL | Status: DC
  Administered 2023-04-15 – 2023-04-18 (×6): 1 via ORAL
  Filled 2023-04-15 (×6): qty 1

## 2023-04-15 MED ORDER — MENTHOL 3 MG MT LOZG: 1.0000 | LOZENGE | OROMUCOSAL | Status: DC | PRN

## 2023-04-15 MED ORDER — ONDANSETRON HCL 4 MG/2ML IJ SOLN: 4.0000 mg | Freq: Four times a day (QID) | INTRAMUSCULAR | Status: DC | PRN

## 2023-04-15 MED ORDER — SODIUM CHLORIDE 0.9 % IV BOLUS: 500.0000 mL | Freq: Once | INTRAVENOUS | Status: DC

## 2023-04-15 MED ORDER — LABETALOL HCL 5 MG/ML IV SOLN
5.0000 mg | Freq: Once | INTRAVENOUS | Status: AC
  Administered 2023-04-15: 5 mg via INTRAVENOUS
  Filled 2023-04-15: qty 4

## 2023-04-15 MED ORDER — ACETAMINOPHEN 650 MG RE SUPP: 650.0000 mg | Freq: Four times a day (QID) | RECTAL | Status: DC | PRN

## 2023-04-15 MED ORDER — GUAIFENESIN ER 600 MG PO TB12
600.0000 mg | ORAL_TABLET | Freq: Two times a day (BID) | ORAL | Status: DC
  Administered 2023-04-15 – 2023-04-18 (×6): 600 mg via ORAL
  Filled 2023-04-15 (×6): qty 1

## 2023-04-15 MED ORDER — LABETALOL HCL 5 MG/ML IV SOLN: 10.0000 mg | INTRAVENOUS | Status: DC | PRN

## 2023-04-15 MED ORDER — CARVEDILOL 25 MG PO TABS
25.0000 mg | ORAL_TABLET | Freq: Two times a day (BID) | ORAL | Status: DC
  Administered 2023-04-16 – 2023-04-18 (×5): 25 mg via ORAL
  Filled 2023-04-15 (×5): qty 1

## 2023-04-15 MED ORDER — OSELTAMIVIR PHOSPHATE 75 MG PO CAPS
75.0000 mg | ORAL_CAPSULE | Freq: Two times a day (BID) | ORAL | Status: DC
  Administered 2023-04-15 – 2023-04-18 (×6): 75 mg via ORAL
  Filled 2023-04-15 (×7): qty 1

## 2023-04-15 MED ORDER — ACETAMINOPHEN 325 MG PO TABS
650.0000 mg | ORAL_TABLET | Freq: Four times a day (QID) | ORAL | Status: DC | PRN
  Administered 2023-04-16: 650 mg via ORAL
  Filled 2023-04-15: qty 2

## 2023-04-15 MED ORDER — SODIUM CHLORIDE 0.9 % IV BOLUS
500.0000 mL | Freq: Once | INTRAVENOUS | Status: AC
  Administered 2023-04-15: 500 mL via INTRAVENOUS

## 2023-04-15 MED ORDER — ACETAMINOPHEN 325 MG PO TABS
650.0000 mg | ORAL_TABLET | Freq: Once | ORAL | Status: AC
  Administered 2023-04-15: 650 mg via ORAL
  Filled 2023-04-15: qty 2

## 2023-04-15 MED ORDER — SODIUM CHLORIDE 0.9 % IV SOLN: 1.0000 g | INTRAVENOUS | Status: DC

## 2023-04-15 MED ORDER — BENZONATATE 100 MG PO CAPS
200.0000 mg | ORAL_CAPSULE | Freq: Three times a day (TID) | ORAL | Status: DC | PRN
  Administered 2023-04-17: 200 mg via ORAL
  Filled 2023-04-15: qty 2

## 2023-04-15 NOTE — ED Provider Notes (Signed)
 [place] [facility] Provider Note   CSN: [id number] Arrival date & time: 04/15/23  1020     History  Chief Complaint  Patient presents with   Fever    Jeffery Glenn is a 53 y.o. male.  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  Patient admits to productive cough, body aches, headache, and fever that started last night.  The history is provided by the patient. No language interpreter was used.  Fever Associated symptoms: cough, headaches and myalgias   Associated symptoms: no chest pain, no chills, no dysuria, no ear pain, no rash, no sore throat and no vomiting        Home Medications Prior to Admission medications   Medication Sig Start Date End Date Taking? Authorizing Provider  atorvastatin  (LIPITOR) 20 MG tablet Take 1 tablet (20 mg total) by mouth daily. 03/05/20   Krasowski, Robert J, MD  carvedilol  (COREG ) 25 MG tablet Take 1.5 tablets (37.5 mg total) by mouth 2 (two) times daily with a meal. 09/16/22   Ladona Heinz, MD  dapagliflozin  propanediol (FARXIGA ) 10 MG TABS tablet Take 1 tablet (10 mg total) by mouth daily before breakfast. 04/16/22   Krasowski, Robert J, MD  furosemide  (LASIX ) 40 MG tablet Take 1 tablet (40 mg total) by mouth daily as needed for fluid or edema (Weight gain >3 Lbs in 3 days). 09/16/22   Ladona Heinz, MD  sacubitril -valsartan  (ENTRESTO ) 97-103 MG Take 1 tablet by mouth 2 (two) times daily. 10/13/21   Dunn, Dayna N, PA-C  spironolactone  (ALDACTONE ) 25 MG tablet Take one (1) tablet by mouth ( 25 mg) daily. 03/02/23   Lelon Hamilton T, PA-C      Allergies    Shellfish allergy, Hydrocodone , Ibuprofen , and Peanut-containing drug products    Review of Systems   Review of Systems  Constitutional:  Positive for fever. Negative for chills.  HENT:  Negative for ear pain and sore throat.    Eyes:  Negative for pain and visual disturbance.  Respiratory:  Positive for cough. Negative for shortness of breath.   Cardiovascular:  Negative for chest pain and palpitations.  Gastrointestinal:  Negative for abdominal pain and vomiting.  Genitourinary:  Negative for dysuria and hematuria.  Musculoskeletal:  Positive for myalgias. Negative for arthralgias and back pain.  Skin:  Negative for color change and rash.  Neurological:  Positive for headaches. Negative for seizures and syncope.  All other systems reviewed and are negative.   Physical Exam Updated Vital Signs BP (!) 194/134   Pulse 61   Temp (!) 100.5 F (38.1 C) (Oral)   Resp (!) 39   Ht 6' (1.829 m)   Wt 97.5 kg   SpO2 97%   BMI 29.16 kg/m  Physical Exam Vitals and nursing note reviewed.  Constitutional:      General: He is not in acute distress.    Appearance: He is well-developed.  HENT:     Head: Normocephalic and atraumatic.  Eyes:     Conjunctiva/sclera: Conjunctivae normal.  Cardiovascular:     Rate and Rhythm: Normal rate and regular rhythm.     Heart sounds: No murmur heard. Pulmonary:     Effort: Pulmonary effort is normal. No respiratory distress.     Breath sounds: Normal breath sounds.  Abdominal:     Palpations: Abdomen is soft.  Tenderness: There is no abdominal tenderness.  Musculoskeletal:        General: No swelling.     Cervical back: Neck supple.  Skin:    General: Skin is warm and dry.     Capillary Refill: Capillary refill takes less than 2 seconds.  Neurological:     Mental Status: He is alert.  Psychiatric:        Mood and Affect: Mood normal.     ED Results / Procedures / Treatments   Labs (all labs ordered are listed, but only abnormal results are displayed) Labs Reviewed  RESP PANEL BY RT-PCR (RSV, FLU A&B, COVID)  RVPGX2 - Abnormal; Notable for the following components:      Result Value   Influenza A by PCR POSITIVE (*)    All other components within normal  limits  CBC WITH DIFFERENTIAL/PLATELET - Abnormal; Notable for the following components:   Platelets 148 (*)    Lymphs Abs 0.4 (*)    All other components within normal limits  BRAIN NATRIURETIC PEPTIDE - Abnormal; Notable for the following components:   B Natriuretic Peptide 1,765.1 (*)    All other components within normal limits  BASIC METABOLIC PANEL - Abnormal; Notable for the following components:   Sodium 134 (*)    Glucose, Bld 113 (*)    Calcium  8.7 (*)    All other components within normal limits    EKG None  Radiology DG Chest 2 View Result Date: 04/15/2023 CLINICAL DATA:  Cough. EXAM: CHEST - 2 VIEW COMPARISON:  02/10/2023 FINDINGS: Stable cardiomegaly and central pulmonary vascular congestion. Internal defibrillator device again seen. Mild scarring again seen in the left midlung. The lungs are otherwise clear. IMPRESSION: Stable cardiomegaly and pulmonary vascular congestion. No active lung disease. Electronically Signed   By: Norleen DELENA Kil M.D.   On: 04/15/2023 11:46    Procedures .Critical Care  Performed by: Elnor Bernarda SQUIBB, DO Authorized by: Elnor Bernarda SQUIBB, DO   Critical care provider statement:    Critical care time (minutes):  74   Critical care was necessary to treat or prevent imminent or life-threatening deterioration of the following conditions:  Respiratory failure   Critical care was time spent personally by me on the following activities:  Development of treatment plan with patient or surrogate, discussions with consultants, evaluation of patient's response to treatment, examination of patient, ordering and review of laboratory studies, ordering and review of radiographic studies, ordering and performing treatments and interventions, pulse oximetry, re-evaluation of patient's condition and review of old charts   Care discussed with: admitting provider       Medications Ordered in ED Medications  oseltamivir  (TAMIFLU ) capsule 75 mg (has no administration in  time range)  acetaminophen  (TYLENOL ) tablet 650 mg (650 mg Oral Given 04/15/23 1159)  sodium chloride  0.9 % bolus 500 mL (0 mLs Intravenous Stopped 04/15/23 1305)    ED Course/ Medical Decision Making/ A&P                                 Medical Decision Making Amount and/or Complexity of Data Reviewed Labs: ordered. Radiology: ordered.  Risk OTC drugs. Prescription drug management. Decision regarding hospitalization.    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  Patient is alert and oriented x 3 on  exam, febrile at 100.5 F, tachypnea, tachycardia 119, and hypertensive at 176/114.  On exam he is equal by lateral breath sounds with no adventitious lung sounds.Patient has positional hypoxia dropping to the upper 80s.  He is influenza positive.  Tamiflu  ordered.  CXR Stable cardiomegaly and pulmonary vascular congestion. No active lung disease.  Patient has a Motrin  allergy-pruritic rash.  Tylenol  650 mg given.  500 cc IV fluid ordered.  Will proceed slowly with fluids secondary to history of heart failure with reduced EF.  Recommending admission for influenza with hypoxia.  Tamiflu  ordered and pending.  Patient accepted by my physician Dr. Claudene.        Final Clinical Impression(s) / ED Diagnoses Final diagnoses:  Acute respiratory failure with hypoxia (HCC)  Fever, unspecified fever cause  Cough, unspecified type  Myalgia  Influenza    Rx / DC Orders ED Discharge Orders     None         Elnor Bernarda SQUIBB, DO 04/15/23 1356

## 2023-04-15 NOTE — ED Notes (Signed)
 Pt. Here due to shortness of breath and reports he has the flu.  Pt. Is positive for flu.

## 2023-04-15 NOTE — ED Triage Notes (Signed)
 In for  eval of flu-like symtopms. Positive for  body aches, productive cough with yellow sputum, headache, chills, fever. Denies sore throat or ear ache. No tylenol  or motrin  today.

## 2023-04-15 NOTE — Progress Notes (Addendum)
 Patient just admitted to [facility] from [place]. Orientated to room and settled in bed.

## 2023-04-15 NOTE — H&P (Addendum)
 History and Physical      Jeffery Glenn FMW:[id number] DOB: 1971/02/28 DOA: 04/15/2023; DOS: 04/15/2023  PCP: Rosalea Rosina SAILOR, PA  Patient coming from: home   I have personally briefly reviewed patient's old medical records in [facility]  Chief Complaint: Shortness of breath  HPI: Jeffery Glenn is a 53 y.o. male with medical history significant for chronic systolic/diastolic heart failure with most recent LVEF 20 to 25%, status post AICD and September 2022, central pretension, who is admitted to [facility] on 04/15/2023 by way of transfer from [facility] with influenza A infection after presenting from home to the latter facility complaining of shortness of breath.   The patient reports 2 days of shortness of breath, starting on 04/14/2023, associated with new onset productive cough, fever, generalized myalgias in the absence of chills or full body rigors.  He reports that shortness of breath is not associate with any orthopnea, PND, or worsening of peripheral edema.  He denies any associated chest pain, palpitations, diaphoresis, dizziness, presyncope, or syncope.  No associated hemoptysis or new calf tenderness.  He also denies any associated wheezing, sore throat, or otalgia.  No recent neck stiffness, rash, abdominal pain, diarrhea, dysuria or gross hematuria.  Denies any known baseline supplemental oxygen requirements.  Conveys that he is a lifelong non-smoker, denies any known chronic underlying pulmonary pathology, including no known history of COPD or asthma.  He has a history of chronic systolic/diastolic heart failure with an element of biventricular heart failure status post ICD placement in September 2022.  Most recent echocardiogram was performed in July 2024 was notable for LVEF 20 to 25%.    [facility] [place] [facility] ED Course:  Vital signs in the ED were notable for the following: Temperature max 102.4; heart rates in the 1 teens to 120s, associate with sinus  tachycardia, still blood pressures in the 160s to 190s; respiratory rate 16-24, oxygen saturation in the high 80s on room air, subsequently prompting initiation of salter high flow nasal cannula, with settings of 15 L/min and 50% FiO2 producing oxygen saturations of 100%.  There was subsequent de-escalation of supplemental oxygen from salter high flow nasal cannula to nasal cannula, 4 L, upon which oxygen saturation is a bit in the range of 96 to 98%.  Labs were notable for the following: BMP notable for the following: Sodium 134, potassium 3.9, creatinine 0.93 compared to 1.02 on 03/02/2023.  BNP 1765 compared to 835 on 02/10/2023, and relative to 1185 in October 2023.  CBC is notable for white blood cell count 6200, hemoglobin 13.2.  Influenza A PCR is positive, will influenza B PCR as well as COVID/RSV PCR were all negative.  Per my interpretation, EKG in ED demonstrated the following: In comparison to most recent prior EKG from 02/15/2023, presenting EKG shows sinus tachycardia with heart rate 123, normal intervals, no evidence of T wave changes, nonspecific less than 1 mm ST elevation in V4, which appears similar to ST elevation noted in most recent prior EKG, showing no evidence of new ST changes.  Imaging in the ED, per corresponding formal radiology read, was notable for the following: 2 view chest x-ray, in comparison to chest x-ray from 02/10/2023 shows stable cardiomegaly and central pulmonary vascular congestion, unchanged from prior, without any evidence of infiltrate, edema, effusion, or pneumothorax.  While in the ED, the following were administered: Acetaminophen  650 mg p.o. x 1 dose, labetalol  5 mg IV x 1 dose, Tamiflu  75 mg p.o.  x 1 dose, normal saline x 5 right cc bolus.  Subsequently, the patient was admitted to [facility] for further evaluation management of influenza A infection complicated by severe sepsis and acute hypoxic respiratory failure.    Review of Systems: As per HPI  otherwise 10 point review of systems negative.   Past Medical History:  Diagnosis Date   Dyspnea on exertion 05/17/2018   Essential hypertension 05/17/2018   HFrEF (heart failure with reduced ejection fraction) (HCC)    Hypertension    ICD (implantable cardioverter-defibrillator) in place    Mitral regurgitation    NICM (nonischemic cardiomyopathy) (HCC)     Past Surgical History:  Procedure Laterality Date   HAND SURGERY     NASAL SEPTUM SURGERY     SUBQ ICD IMPLANT N/A 11/08/2020   Procedure: SUBQ ICD IMPLANT;  Surgeon: Inocencio Soyla Lunger, MD;  Location: [facility] INVASIVE CV LAB;  Service: Cardiovascular;  Laterality: N/A;    Social History:  reports that he has never smoked. He has been exposed to tobacco smoke. He has never used smokeless tobacco. He reports current alcohol use. He reports that he does not use drugs.   Allergies  Allergen Reactions   Shellfish Allergy Other (See Comments)    hives   Hydrocodone     Ibuprofen     Peanut-Containing Drug Products Other (See Comments)    Allergy test/ Patient eats peanuts    Family History  Problem Relation Age of Onset   Heart attack Mother    Hypertension Mother    Heart attack Father    Hypertension Father    Heart attack Brother 48       first one    Family history reviewed and not pertinent    Prior to Admission medications   Medication Sig Start Date End Date Taking? Authorizing Provider  atorvastatin  (LIPITOR) 20 MG tablet Take 1 tablet (20 mg total) by mouth daily. 03/05/20  Yes Krasowski, Robert J, MD  carvedilol  (COREG ) 25 MG tablet Take 1.5 tablets (37.5 mg total) by mouth 2 (two) times daily with a meal. 09/16/22  Yes Ladona Heinz, MD  dapagliflozin  propanediol (FARXIGA ) 10 MG TABS tablet Take 1 tablet (10 mg total) by mouth daily before breakfast. 04/16/22  Yes Krasowski, Robert J, MD  furosemide  (LASIX ) 40 MG tablet Take 1 tablet (40 mg total) by mouth daily as needed for fluid or edema (Weight gain >3 Lbs in 3  days). 09/16/22  Yes Ladona Heinz, MD  sacubitril -valsartan  (ENTRESTO ) 97-103 MG Take 1 tablet by mouth 2 (two) times daily. 10/13/21  Yes Dunn, Dayna N, PA-C  spironolactone  (ALDACTONE ) 25 MG tablet Take one (1) tablet by mouth ( 25 mg) daily. 03/02/23  Yes Lelon Hamilton T, PA-C     Objective    Physical Exam: Vitals:   04/15/23 1730 04/15/23 1757 04/15/23 1859 04/15/23 1931  BP: (!) 182/130  (!) 171/121 (!) 169/119  Pulse: (!) 123  (!) 118 (!) 114  Resp: (!) 36  (!) 24 (!) 36  Temp:   (!) 102.4 F (39.1 C) (!) 100.7 F (38.2 C)  TempSrc:   Oral Oral  SpO2: 98% 97% 98% 96%  Weight:      Height:        General: appears to be stated age; alert, oriented; mildly increased work of breathing noted Skin: warm, dry, no rash Head:  AT/[place] Mouth:  Oral mucosa membranes appear moist, normal dentition Neck: supple; trachea midline Heart: Tachycardic, but regular; did not appreciate any  2/6 holosystolic murmur noted.  Lungs: CTAB, did not appreciate any wheezes, rales, or rhonchi Abdomen: + BS; soft, ND, NT Vascular: 2+ pedal pulses b/l; 2+ radial pulses b/l Extremities: no peripheral edema, no muscle wasting Neuro: strength and sensation intact in upper and lower extremities b/l   Labs on Admission: I have personally reviewed following labs and imaging studies  CBC: Recent Labs  Lab 04/15/23 1225  WBC 6.2  NEUTROABS 4.9  HGB 13.2  HCT 40.4  MCV 82.3  PLT 148*   Basic Metabolic Panel: Recent Labs  Lab 04/15/23 1225  NA 134*  K 3.9  CL 100  CO2 23  GLUCOSE 113*  BUN 14  CREATININE 0.93  CALCIUM  8.7*   GFR: Estimated Creatinine Clearance: 115.1 mL/min (by C-G formula based on SCr of 0.93 mg/dL). Liver Function Tests: No results for input(s): AST, ALT, ALKPHOS, BILITOT, PROT, ALBUMIN in the last 168 hours. No results for input(s): LIPASE, AMYLASE in the last 168 hours. No results for input(s): AMMONIA in the last 168 hours. Coagulation Profile: No  results for input(s): INR, PROTIME in the last 168 hours. Cardiac Enzymes: No results for input(s): CKTOTAL, CKMB, CKMBINDEX, TROPONINI in the last 168 hours. BNP (last 3 results) No results for input(s): PROBNP in the last 8760 hours. HbA1C: No results for input(s): HGBA1C in the last 72 hours. CBG: No results for input(s): GLUCAP in the last 168 hours. Lipid Profile: No results for input(s): CHOL, HDL, LDLCALC, TRIG, CHOLHDL, LDLDIRECT in the last 72 hours. Thyroid Function Tests: No results for input(s): TSH, T4TOTAL, FREET4, T3FREE, THYROIDAB in the last 72 hours. Anemia Panel: No results for input(s): VITAMINB12, FOLATE, FERRITIN, TIBC, IRON, RETICCTPCT in the last 72 hours. Urine analysis:    Component Value Date/Time   COLORURINE YELLOW 08/01/2021 0531   APPEARANCEUR CLEAR 08/01/2021 0531   LABSPEC 1.025 08/01/2021 0531   PHURINE 5.5 08/01/2021 0531   GLUCOSEU NEGATIVE 08/01/2021 0531   HGBUR NEGATIVE 08/01/2021 0531   BILIRUBINUR NEGATIVE 08/01/2021 0531   KETONESUR NEGATIVE 08/01/2021 0531   PROTEINUR NEGATIVE 08/01/2021 0531   NITRITE NEGATIVE 08/01/2021 0531   LEUKOCYTESUR NEGATIVE 08/01/2021 0531    Radiological Exams on Admission: DG Chest 2 View Result Date: 04/15/2023 CLINICAL DATA:  Cough. EXAM: CHEST - 2 VIEW COMPARISON:  02/10/2023 FINDINGS: Stable cardiomegaly and central pulmonary vascular congestion. Internal defibrillator device again seen. Mild scarring again seen in the left midlung. The lungs are otherwise clear. IMPRESSION: Stable cardiomegaly and pulmonary vascular congestion. No active lung disease. Electronically Signed   By: Norleen DELENA Kil M.D.   On: 04/15/2023 11:46      Assessment/Plan   Principal Problem:   Influenza A Active Problems:   Essential hypertension   Severe sepsis (HCC)   Fever   Acute hypoxic respiratory failure (HCC)   Chronic combined systolic and diastolic heart failure  (HCC)     #) Severe sepsis due to influenza A infection: Diagnosis on the basis of 2 days of presenting shortness of breath, productive cough, generalized myalgias, fever, with finding of positive influenza A PCR in the ED today.  As these symptoms started within the last 48 hours, he is a candidate for Tamiflu .  SIRS criteria met via objective fever, tachycardia, tachypnea. Lactic acid level: Currently pending. Of note, given the associated presence of suspected end organ damage in the form of concominant presenting acute hypoxic respiratory failure, criteria are met for pt's sepsis to be considered severe in nature. However, in the absence of  lactic acid level that is greater than or equal to 4.0, and in the absence of any associated hypotension refractory to IVF's, there are no indications for administration of a 30 mL/kg IVF bolus at this time.  Of note, we will refrain from aggressive IV fluids given the degree of the patient's chronic systolic/diastolic heart failure, associated with most recent LVEF of 20 to 25%.  Additional ED work-up/management notable for: Chest x-ray, which showed no evidence of acute cardiopulmonary process, including no evidence of infiltrate to suggest concomitant bacterial pneumonia. Will also add on procalcitonin level and check urinalysis.  Of note, COVID-19, RSV, and influenza B PCR were all negative.  As there is currently no evidence of underlying bacterial infection, will refrain from initiation of antibiotics at this time.  Plan: CBC w/ diff and CMP in AM.  Check stat lactic acid level every 3 hours x 2 occurrences.  Check blood cultures x 2.  Refraining from initiation of antibiotics in the absence of any evidence of bacterial infection, as above.  Tamiflu .  Add on procalcitonin level.  Check urinalysis.  Incentive spirometry.  Flutter valve.  Scheduled guaifenesin .  As needed Tessalon  Perles.  Prn acetaminophen .                #) Acute hypoxic  respiratory failure: in the context of acute respiratory symptoms and no known baseline supplemental O2 requirements, presenting O2 sat note to be in the high 80s on room air, prompting transient initiation of salter high flow nasal cannula, prior to de-escalation of supplemental oxygen delivery to 4 L nasal cannula, upon which the patient has been maintaining oxygen saturations in the range of 96 to 98%. Appears to be on basis of his presenting influenza A infection, as further detailed above. Presenting CXR shows shows no evidence of acute cardiopulmonary process, including no evidence of infiltrate to suggest a component of bacterial pneumonia. Will check procalcitonin level to further assess the latter.  No known chronic underlying pulmonary conditions .  In terms of other considered etiologies, ACS appears less likely at this time in the absence of any recent CP and in the context of presenting EKG showing no e/o acute ischemic process, with no significant changes noted relative to most recent prior EKG from 02/15/2023.   Is at risk for acutely heart failure exacerbation in the setting of his history of chronic systolic/diastolic heart failure with most recent LVEF of 20 to 25%, clinically, his presentation appears less suggestive of acutely decompensated heart failure, and chest x-ray shows no evidence of acute volume overload, including no evidence of infiltrate, pulmonary edema, interstitial edema, pleural effusion.  His BNP is noted to be mildly elevated relative to most recent prior value.  Will continue to trend BMP as outlined below.  Clinically, presentation is less suggestive of acute PE at this time.  COVID, influenza B, and RSV PCR were all negative.  Will also check and trend blood gas to monitor for evidence of developing respiratory fatigue.  Plan: further evaluation/management of presenting influenza A infection, as above, including Tamiflu . monitor on telemetry. CMP/CBC in the AM. Check  serum Mg and Phos levels. Check Flutter valve, incentive spirometry.  Scheduled guaifenesin .  Add on procalcitonin level.  Repeat BMP in the morning. VBG now, with repeat vbg in the AM.                     #) Essential Hypertension: documented h/o such, with outpatient antihypertensive regimen , in the  context of his history of chronic systolic/diastolic heart failure, which includes Coreg , Entresto , spironolactone  as well as as needed Lasix  .  Systolic blood pressures have been in the 160s to 190s, with elevated heart rates associated sinus tachycardia.  Given his history of CHF, will strive for improvement in afterload reduction.  Given his concomitant presenting severe sepsis as well as increased risk of development of acute on chronic systolic/diastolic heart failure, with elevated BNP noted, will transiently reduce his home dose of Coreg  from 37.5 mg p.o. twice daily to 25 mg p.o. twice daily.  Otherwise, resume home Entresto , spironolactone .  Plan: Close monitoring of subsequent BP via routine VS. resume home Coreg , but reduce dose to 25 mg p.o. twice daily, as above.  Resume home Entresto , spironolactone .  Monitor strict I's and O's Daily weights.  Monitor on telemetry.  Prn IV labetalol  for systolic blood pressure greater than 170 or diastolic blood pressure greater than 110 mmHg.                    #) Chronic combined systolic and diastolic heart failure: documented history of such, with most recent echocardiogram performed on 09/14/2022, which was notable for LVEF 20 to 25%, moderately dilated left ventricle, grade 3 diastolic dysfunction, severely dilated left atrium, mildly reduced right ventricular systolic function, moderate mitral regurgitation and mild tricuspid regurgitation.  He is status post ICD placement in September 2022.  As described above, will BNP is mildly elevated, no additional clinical or radiographic evidence to suggest acutely decompensated  heart failure at this time.  Reports compliance with his outpatient goal-directed heart failure regimen, which includes Coreg , Entresto , spironolactone , dapagliflozin .  He is also on as needed Lasix  at home.  Will resume his outpatient cardiac medications, will noting that will transiently reduce the dose of home Coreg , as further detailed above.  His renal function appears to be at baseline, as further quantified above.  Plan: monitor strict I's & O's and daily weights. Repeat CMP in the morning. Check serum magnesium level. Continue home dapagliflozin , Entresto , spironolactone .  Continue home Coreg , but with transient dose reduction to 25 mg p.o. twice daily, as above.  Repeat BMP in the morning.  Further evaluation management of presenting severe sepsis due to influenza A infection, as above.       DVT prophylaxis: SCD's   Code Status: Full code Family Communication: none Disposition Plan: Per Rounding Team Consults called: none;  Admission status: Inpatient     I SPENT GREATER THAN 75  MINUTES IN CLINICAL CARE TIME/MEDICAL DECISION-MAKING IN COMPLETING THIS ADMISSION.      Liddy Deam B Savreen Gebhardt DO [facility]  From 7PM - 7AM   04/15/2023, 8:17 PM

## 2023-04-16 DIAGNOSIS — I5042 Chronic combined systolic (congestive) and diastolic (congestive) heart failure: Secondary | ICD-10-CM | POA: Diagnosis not present

## 2023-04-16 DIAGNOSIS — J9601 Acute respiratory failure with hypoxia: Secondary | ICD-10-CM | POA: Diagnosis not present

## 2023-04-16 DIAGNOSIS — J101 Influenza due to other identified influenza virus with other respiratory manifestations: Secondary | ICD-10-CM | POA: Diagnosis not present

## 2023-04-16 LAB — RESPIRATORY PANEL BY PCR

## 2023-04-16 LAB — COMPREHENSIVE METABOLIC PANEL
ALT: 28 U/L (ref 0–44)
AST: 37 U/L (ref 15–41)
Albumin: 3.5 g/dL (ref 3.5–5.0)
Alkaline Phosphatase: 27 U/L — ABNORMAL LOW (ref 38–126)
Anion gap: 13 (ref 5–15)
BUN: 17 mg/dL (ref 6–20)
CO2: 23 mmol/L (ref 22–32)
Calcium: 8.5 mg/dL — ABNORMAL LOW (ref 8.9–10.3)
Chloride: 101 mmol/L (ref 98–111)
Creatinine, Ser: 1.12 mg/dL (ref 0.61–1.24)
GFR, Estimated: >60 mL/min (ref >60)
Glucose, Bld: 117 mg/dL — ABNORMAL HIGH (ref 70–99)
Potassium: 3.9 mmol/L (ref 3.5–5.1)
Sodium: 137 mmol/L (ref 135–145)
Total Bilirubin: 1.2 mg/dL (ref 0.0–1.2)
Total Protein: 6.9 g/dL (ref 6.5–8.1)

## 2023-04-16 LAB — CBC WITH DIFFERENTIAL/PLATELET
Abs Immature Granulocytes: 0.02 10*3/uL (ref 0.00–0.07)
Basophils Absolute: 0 10*3/uL (ref 0.0–0.1)
Basophils Relative: 0 %
Eosinophils Absolute: 0 10*3/uL (ref 0.0–0.5)
Eosinophils Relative: 0 %
HCT: 42.1 % (ref 39.0–52.0)
Hemoglobin: 13.9 g/dL (ref 13.0–17.0)
Immature Granulocytes: 0 %
Lymphocytes Relative: 18 %
Lymphs Abs: 0.9 10*3/uL (ref 0.7–4.0)
MCH: 27.2 pg (ref 26.0–34.0)
MCHC: 33 g/dL (ref 30.0–36.0)
MCV: 82.4 fL (ref 80.0–100.0)
Monocytes Absolute: 0.5 10*3/uL (ref 0.1–1.0)
Monocytes Relative: 9 %
Neutro Abs: 3.5 10*3/uL (ref 1.7–7.7)
Neutrophils Relative %: 73 %
Platelets: 138 10*3/uL — ABNORMAL LOW (ref 150–400)
RBC: 5.11 MIL/uL (ref 4.22–5.81)
RDW: 15.1 % (ref 11.5–15.5)
Smear Review: ADEQUATE
WBC: 4.8 10*3/uL (ref 4.0–10.5)
nRBC: 0 % (ref 0.0–0.2)

## 2023-04-16 LAB — BLOOD GAS, VENOUS
Acid-Base Excess: 2 mmol/L (ref 0.0–2.0)
Bicarbonate: 27.3 mmol/L (ref 20.0–28.0)
Drawn by: 71108
O2 Saturation: 100 %
Patient temperature: 37
pCO2, Ven: 44 mm[Hg] (ref 44–60)
pH, Ven: 7.4 (ref 7.25–7.43)
pO2, Ven: 126 mm[Hg] — ABNORMAL HIGH (ref 32–45)

## 2023-04-16 LAB — MAGNESIUM: Magnesium: 1.9 mg/dL (ref 1.7–2.4)

## 2023-04-16 LAB — BRAIN NATRIURETIC PEPTIDE: B Natriuretic Peptide: 2403.2 pg/mL — ABNORMAL HIGH (ref 0.0–100.0)

## 2023-04-16 LAB — PHOSPHORUS: Phosphorus: 3.8 mg/dL (ref 2.5–4.6)

## 2023-04-16 MED ORDER — METHYLPREDNISOLONE SODIUM SUCC 125 MG IJ SOLR
80.0000 mg | Freq: Every day | INTRAMUSCULAR | Status: DC
  Administered 2023-04-16 – 2023-04-17 (×2): 80 mg via INTRAVENOUS
  Filled 2023-04-16 (×2): qty 2

## 2023-04-16 MED ORDER — ENOXAPARIN SODIUM 40 MG/0.4ML IJ SOSY
40.0000 mg | PREFILLED_SYRINGE | INTRAMUSCULAR | Status: DC
  Administered 2023-04-16 – 2023-04-17 (×2): 40 mg via SUBCUTANEOUS
  Filled 2023-04-16 (×2): qty 0.4

## 2023-04-16 MED ORDER — METHYLPREDNISOLONE SODIUM SUCC 125 MG IJ SOLR: 80.0000 mg | Freq: Two times a day (BID) | INTRAMUSCULAR | Status: DC

## 2023-04-16 MED ORDER — PANTOPRAZOLE SODIUM 40 MG PO TBEC
40.0000 mg | DELAYED_RELEASE_TABLET | Freq: Every day | ORAL | Status: DC
  Administered 2023-04-16 – 2023-04-18 (×3): 40 mg via ORAL
  Filled 2023-04-16 (×3): qty 1

## 2023-04-16 NOTE — TOC Initial Note (Signed)
 Transition of Care [facility]) - Initial/Assessment Note    Patient Details  Name: Jeffery Glenn MRN: [id number] Date of Birth: [birth year]  [facility]) CM/SW Contact:    Andrez JULIANNA George, RN Phone Number: 04/16/2023, 1:16 PM  Clinical Narrative:                  Pt is from home alone. His spouse lives around the corner and can check in on him. No DME.  He manages his own medications and denies any issues.  Pt denies issues with transportation. TOC following for discharge needs.   Expected Discharge Plan: Home/Self Care Barriers to Discharge: Continued Medical Work up   Patient Goals and CMS Choice            Expected Discharge Plan and Services       Living arrangements for the past 2 months: Single Family Home                                      Prior Living Arrangements/Services Living arrangements for the past 2 months: Single Family Home Lives with:: Self Patient language and need for interpreter reviewed:: Yes Do you feel safe going back to the place where you live?: Yes            Criminal Activity/Legal Involvement Pertinent to Current Situation/Hospitalization: No - Comment as needed  Activities of Daily Living   ADL Screening (condition at time of admission) Independently performs ADLs?: Yes (appropriate for developmental age) Is the patient deaf or have difficulty hearing?: No Does the patient have difficulty seeing, even when wearing glasses/contacts?: No Does the patient have difficulty concentrating, remembering, or making decisions?: No  Permission Sought/Granted                  Emotional Assessment Appearance:: Appears stated age Attitude/Demeanor/Rapport: Engaged Affect (typically observed): Accepting Orientation: : Oriented to Self, Oriented to Place, Oriented to  Time, Oriented to Situation   Psych Involvement: No (comment)  Admission diagnosis:  Myalgia [M79.10] Influenza A [J10.1] Acute respiratory failure with  hypoxia (HCC) [J96.01] Influenza [J11.1] Fever, unspecified fever cause [R50.9] Cough, unspecified type [R05.9] Patient Active Problem List   Diagnosis Date Noted   Influenza A 04/15/2023   Severe sepsis (HCC) 04/15/2023   Fever 04/15/2023   Acute hypoxic respiratory failure (HCC) 04/15/2023   Chronic combined systolic and diastolic heart failure (HCC) 04/15/2023   Mitral regurgitation 03/01/2023   OSA (obstructive sleep apnea) 08/20/2022   Encounter for assessment of implantable cardioverter-defibrillator (ICD) 06/17/2022   Snoring 10/24/2021   Near syncope 08/26/2021   Hypotension 08/25/2021   ICD ( Sub-Q) [employer] emblem MRI S ICD 11/08/2020 01/16/2021   Nonischemic cardiomyopathy (HCC) 06/09/2018   Essential hypertension 05/17/2018   HFrEF (heart failure with reduced ejection fraction) (HCC) 05/06/2018   PCP:  Rosalea Rosina SAILOR, PA Pharmacy:   [facility] [phone] - [place], [place] - [address] [place] [address] Phone: [phone] Fax: [phone]  [id number] - [place], [place] - [address] AT [address] [place] [address] Phone: [phone] Fax: [phone]  [id number] - [place], [place] - [address] AT [facility] [address] [place] [address] Phone: [phone] Fax: [phone]  Social Drivers of Health (SDOH) Social History: SDOH Screenings   Food Insecurity: No Food Insecurity (04/16/2023)  Housing: High Risk (04/16/2023)  Transportation Needs: No Transportation Needs (04/16/2023)  Utilities: Not At Risk (04/16/2023)  Financial Resource Strain: Not on File (06/26/2021)   Received from [place], [place]  Physical Activity: Not on File (06/26/2021)   Received from [place], [place]  Social Connections: Not on File (11/27/2022)   Received from [facility]  Stress: Not on File (06/26/2021)   Received from [place], [place]   Tobacco Use: Low Risk  (04/15/2023)   SDOH Interventions:     Readmission Risk Interventions     No data to display

## 2023-04-16 NOTE — Progress Notes (Signed)
 Patient arrived   from [place] , alert, oriented x3, ambulated independently. Skin is intact. Temperature 102 per dayshift nurse reported. But went down 98.7 @ 2100. HR  up to 127, RR 33-46. MD  notified presented at bedside. PT was given 10 mg IV Labetalol , Entresto ,  25 mg Carvedilol  , HR gradually stable , 78, 73 and RR 17. MEWS 6 to MEWS 0.

## 2023-04-16 NOTE — Plan of Care (Signed)

## 2023-04-16 NOTE — Plan of Care (Signed)
   Problem: Education: Goal: Knowledge of General Education information will improve Description: Including pain rating scale, medication(s)/side effects and non-pharmacologic comfort measures Outcome: Progressing   Problem: Clinical Measurements: Goal: Diagnostic test results will improve Outcome: Progressing

## 2023-04-16 NOTE — Progress Notes (Signed)
 PROGRESS NOTE    Jeffery Glenn  FMW:[id number] DOB: 04-26-1970 DOA: 04/15/2023 PCP: Rosalea Rosina SAILOR, PA    Chief Complaint  Patient presents with   Fever    Brief Narrative:   Jeffery Glenn is a 53 y.o. male with medical history significant for chronic systolic/diastolic heart failure with most recent LVEF 20 to 25%, status post AICD and September 2022, who is admitted to [facility] on 04/15/2023 by way of transfer from [facility] with influenza A infection after presenting from home to the latter facility complaining of shortness of breath.    Assessment & Plan:   Principal Problem:   Influenza A Active Problems:   Essential hypertension   Severe sepsis (HCC)   Fever   Acute hypoxic respiratory failure (HCC)   Chronic combined systolic and diastolic heart failure (HCC)  Severe sepsis due to influenza A infection Acute respiratory failure with hypoxia -Workup significant for positive influenza A PCR -Continue with Tamiflu  -He is with significant wheezing, will start on IV steroids -He was encouraged use incentive spirometry and flutter valve-saturating in the upper 80s initially, on oxygen, will wean as tolerated.  Hypertension -Resume home medications, but continue at a lower dose, decrease Coreg  37.5 mg to 25 mg twice daily, resume Entresto , Aldactone    Chronic combined systolic and diastolic CHF - most recent echocardiogram performed on 09/14/2022, which was notable for LVEF 20 to 25%, moderately dilated left ventricle, grade 3 diastolic dysfunction, severely dilated left atrium, mildly reduced right ventricular systolic function, moderate mitral regurgitation and mild tricuspid regurgitation.  He is status post ICD placement in September 2022.  -Compensated, he is a euvolemic, continue with home dapagliflozin  , Entresto , Aldactone , continue with lower dose Coreg  -Will hold on resuming torsemide for now as appears compensated, and he presents with  sepsis  OSA -Continue with CPAP  DVT prophylaxis: Lovenox  Code Status: Full Family Communication: D/W patient Disposition:   Status is: Inpatient \   Consultants:  none   Subjective: Reports cough, dyspnea, generalized weakness and fatigue, but reports he is feeling better  Objective: Vitals:   04/16/23 0300 04/16/23 0336 04/16/23 0600 04/16/23 0719  BP: 135/77 121/78 133/78 (!) 120/90  Pulse: 70 73 73 72  Resp: (!) 26 (!) 26 17 (!) 24  Temp:  98.1 F (36.7 C) 99.4 F (37.4 C) 98.5 F (36.9 C)  TempSrc: Oral Oral Oral Oral  SpO2: 97% 98% 98% 97%  Weight:      Height:        Intake/Output Summary (Last 24 hours) at 04/16/2023 1054 Last data filed at 04/15/2023 1305 Gross per 24 hour  Intake 500.41 ml  Output --  Net 500.41 ml   Filed Weights   04/15/23 1035  Weight: 97.5 kg    Examination:  Awake Alert, Oriented X 3, No new F.N deficits, Normal affect Symmetrical Chest wall movement, has wheezing bilaterally RRR,No Gallops,Rubs or new Murmurs, No Parasternal Heave +ve B.Sounds, Abd Soft, No tenderness, No rebound - guarding or rigidity. No Cyanosis, Clubbing , trace edema, No new Rash or bruise     Data Reviewed: I have personally reviewed following labs and imaging studies  CBC: Recent Labs  Lab 04/15/23 1225 04/16/23 0603  WBC 6.2 4.8  NEUTROABS 4.9 3.5  HGB 13.2 13.9  HCT 40.4 42.1  MCV 82.3 82.4  PLT 148* 138*    Basic Metabolic Panel: Recent Labs  Lab 04/15/23 1225 04/15/23 2101 04/16/23 0603  NA 134*  --  137  K 3.9  --  3.9  CL 100  --  101  CO2 23  --  23  GLUCOSE 113*  --  117*  BUN 14  --  17  CREATININE 0.93  --  1.12  CALCIUM  8.7*  --  8.5*  MG  --  1.7 1.9  PHOS  --   --  3.8    GFR: Estimated Creatinine Clearance: 95.5 mL/min (by C-G formula based on SCr of 1.12 mg/dL).  Liver Function Tests: Recent Labs  Lab 04/16/23 0603  AST 37  ALT 28  ALKPHOS 27*  BILITOT 1.2  PROT 6.9  ALBUMIN 3.5    CBG: No  results for input(s): GLUCAP in the last 168 hours.   Recent Results (from the past 240 hours)  Resp panel by RT-PCR (RSV, Flu A&B, Covid) Anterior Nasal Swab     Status: Abnormal   Collection Time: 04/15/23 10:39 AM   Specimen: Anterior Nasal Swab  Result Value Ref Range Status   SARS Coronavirus 2 by RT PCR NEGATIVE NEGATIVE Final    Comment: (NOTE) SARS-CoV-2 target nucleic acids are NOT DETECTED.  The SARS-CoV-2 RNA is generally detectable in upper respiratory specimens during the acute phase of infection. The lowest concentration of SARS-CoV-2 viral copies this assay can detect is 138 copies/mL. A negative result does not preclude SARS-Cov-2 infection and should not be used as the sole basis for treatment or other patient management decisions. A negative result may occur with  improper specimen collection/handling, submission of specimen other than nasopharyngeal swab, presence of viral mutation(s) within the areas targeted by this assay, and inadequate number of viral copies(<138 copies/mL). A negative result must be combined with clinical observations, patient history, and epidemiological information. The expected result is Negative.  Fact Sheet for Patients:  bloggercourse.com  Fact Sheet for Healthcare Providers:  seriousbroker.it  This test is no t yet approved or cleared by the [place]  FDA and  has been authorized for detection and/or diagnosis of SARS-CoV-2 by FDA under an Emergency Use Authorization (EUA). This EUA will remain  in effect (meaning this test can be used) for the duration of the COVID-19 declaration under Section 564(b)(1) of the Act, 21 U.S.C.section 360bbb-3(b)(1), unless the authorization is terminated  or revoked sooner.       Influenza A by PCR POSITIVE (A) NEGATIVE Final   Influenza B by PCR NEGATIVE NEGATIVE Final    Comment: (NOTE) The Xpert Xpress SARS-CoV-2/FLU/RSV plus assay is  intended as an aid in the diagnosis of influenza from Nasopharyngeal swab specimens and should not be used as a sole basis for treatment. Nasal washings and aspirates are unacceptable for Xpert Xpress SARS-CoV-2/FLU/RSV testing.  Fact Sheet for Patients: bloggercourse.com  Fact Sheet for Healthcare Providers: seriousbroker.it  This test is not yet approved or cleared by the [place]  FDA and has been authorized for detection and/or diagnosis of SARS-CoV-2 by FDA under an Emergency Use Authorization (EUA). This EUA will remain in effect (meaning this test can be used) for the duration of the COVID-19 declaration under Section 564(b)(1) of the Act, 21 U.S.C. section 360bbb-3(b)(1), unless the authorization is terminated or revoked.     Resp Syncytial Virus by PCR NEGATIVE NEGATIVE Final    Comment: (NOTE) Fact Sheet for Patients: bloggercourse.com  Fact Sheet for Healthcare Providers: seriousbroker.it  This test is not yet approved or cleared by the [place]  FDA and has been authorized for detection and/or diagnosis of SARS-CoV-2 by FDA under  an Emergency Use Authorization (EUA). This EUA will remain in effect (meaning this test can be used) for the duration of the COVID-19 declaration under Section 564(b)(1) of the Act, 21 U.S.C. section 360bbb-3(b)(1), unless the authorization is terminated or revoked.  Performed at [facility], [address] [place], [place] [address]   Culture, blood (Routine X 2) w Reflex to ID Panel     Status: None (Preliminary result)   Collection Time: 04/15/23  9:04 PM   Specimen: BLOOD LEFT HAND  Result Value Ref Range Status   Specimen Description BLOOD LEFT HAND  Final   Special Requests   Final    BOTTLES DRAWN AEROBIC AND ANAEROBIC Blood Culture results may not be optimal due to an inadequate volume of blood received in  culture bottles   Culture   Final    NO GROWTH < 12 HOURS Performed at [facility], [address] [place], [place] [address]    Report Status PENDING  Incomplete  Culture, blood (Routine X 2) w Reflex to ID Panel     Status: None (Preliminary result)   Collection Time: 04/15/23  9:04 PM   Specimen: BLOOD RIGHT HAND  Result Value Ref Range Status   Specimen Description BLOOD RIGHT HAND  Final   Special Requests   Final    BOTTLES DRAWN AEROBIC AND ANAEROBIC Blood Culture results may not be optimal due to an inadequate volume of blood received in culture bottles   Culture   Final    NO GROWTH < 12 HOURS Performed at [facility], [address], [place], [place] [address]    Report Status PENDING  Incomplete         Radiology Studies: DG Chest 2 View Result Date: 04/15/2023 CLINICAL DATA:  Cough. EXAM: CHEST - 2 VIEW COMPARISON:  02/10/2023 FINDINGS: Stable cardiomegaly and central pulmonary vascular congestion. Internal defibrillator device again seen. Mild scarring again seen in the left midlung. The lungs are otherwise clear. IMPRESSION: Stable cardiomegaly and pulmonary vascular congestion. No active lung disease. Electronically Signed   By: Norleen DELENA Kil M.D.   On: 04/15/2023 11:46        Scheduled Meds:  atorvastatin   20 mg Oral Daily   carvedilol   25 mg Oral BID WC   dapagliflozin  propanediol  10 mg Oral QAC breakfast   guaiFENesin   600 mg Oral BID   oseltamivir   75 mg Oral BID   sacubitril -valsartan   1 tablet Oral BID   spironolactone   25 mg Oral Daily   Continuous Infusions:   LOS: 1 day       Jihaad Bruschi, MD [facility]   To contact the attending provider between 7A-7P or the covering provider during after hours 7P-7A, please log into the web site [email or web address] and access using universal [place] password for that web site. If you do not have the password, please call the hospital operator.  04/16/2023, 10:54 AM

## 2023-04-17 DIAGNOSIS — I5042 Chronic combined systolic (congestive) and diastolic (congestive) heart failure: Secondary | ICD-10-CM | POA: Diagnosis not present

## 2023-04-17 DIAGNOSIS — J101 Influenza due to other identified influenza virus with other respiratory manifestations: Secondary | ICD-10-CM | POA: Diagnosis not present

## 2023-04-17 DIAGNOSIS — I10 Essential (primary) hypertension: Secondary | ICD-10-CM | POA: Diagnosis not present

## 2023-04-17 LAB — CBC
HCT: 44.4 % (ref 39.0–52.0)
Hemoglobin: 14.5 g/dL (ref 13.0–17.0)
MCH: 26.7 pg (ref 26.0–34.0)
MCHC: 32.7 g/dL (ref 30.0–36.0)
MCV: 81.8 fL (ref 80.0–100.0)
Platelets: 146 10*3/uL — ABNORMAL LOW (ref 150–400)
RBC: 5.43 MIL/uL (ref 4.22–5.81)
RDW: 15.1 % (ref 11.5–15.5)
WBC: 2.8 10*3/uL — ABNORMAL LOW (ref 4.0–10.5)
nRBC: 0 % (ref 0.0–0.2)

## 2023-04-17 LAB — BASIC METABOLIC PANEL
Anion gap: 13 (ref 5–15)
BUN: 22 mg/dL — ABNORMAL HIGH (ref 6–20)
CO2: 22 mmol/L (ref 22–32)
Calcium: 8.9 mg/dL (ref 8.9–10.3)
Chloride: 101 mmol/L (ref 98–111)
Creatinine, Ser: 1.11 mg/dL (ref 0.61–1.24)
GFR, Estimated: >60 mL/min (ref >60)
Glucose, Bld: 189 mg/dL — ABNORMAL HIGH (ref 70–99)
Potassium: 4.2 mmol/L (ref 3.5–5.1)
Sodium: 136 mmol/L (ref 135–145)

## 2023-04-17 MED ORDER — MAGIC MOUTHWASH
5.0000 mL | Freq: Four times a day (QID) | ORAL | Status: DC
  Administered 2023-04-17 – 2023-04-18 (×4): 5 mL via ORAL
  Filled 2023-04-17 (×5): qty 5

## 2023-04-17 MED ORDER — FUROSEMIDE 40 MG PO TABS
40.0000 mg | ORAL_TABLET | Freq: Once | ORAL | Status: AC
  Administered 2023-04-17: 40 mg via ORAL
  Filled 2023-04-17: qty 1

## 2023-04-17 NOTE — Progress Notes (Signed)
 Patient is alert, awake ambulated independently.  Stable Vital signs ,: temperature 97.6.Patient took a shower last night per ordered.

## 2023-04-17 NOTE — Plan of Care (Signed)
   Problem: Clinical Measurements: Goal: Cardiovascular complication will be avoided Outcome: Progressing

## 2023-04-17 NOTE — Plan of Care (Signed)

## 2023-04-17 NOTE — Progress Notes (Signed)
 PROGRESS NOTE    Jeffery Glenn  FMW:[id number] DOB: 24-Jun-1970 DOA: 04/15/2023 PCP: Rosalea Rosina SAILOR, PA    Chief Complaint  Patient presents with   Fever    Brief Narrative:   Jeffery Glenn is a 53 y.o. male with medical history significant for chronic systolic/diastolic heart failure with most recent LVEF 20 to 25%, status post AICD and September 2022, who is admitted to [facility] on 04/15/2023 by way of transfer from [facility] with influenza A infection after presenting from home to the latter facility complaining of shortness of breath.    Assessment & Plan:   Principal Problem:   Influenza A Active Problems:   Essential hypertension   Severe sepsis (HCC)   Fever   Acute hypoxic respiratory failure (HCC)   Chronic combined systolic and diastolic heart failure (HCC)  Severe sepsis due to influenza A infection Acute respiratory failure with hypoxia -Workup significant for positive influenza A PCR -Continue with Tamiflu  -Zinc still present, but much improved, continue with current dose IV steroids and hopefully transition to prednisone taper by tomorrow -He was encouraged use incentive spirometry and flutter valve-saturating in the upper 80s initially, on oxygen, will wean as tolerated.  This morning he is on room air.  Hypertension -Resume home medications, but continue at a lower dose, decrease Coreg  37.5 mg to 25 mg twice daily, resume Entresto , Aldactone    Chronic combined systolic and diastolic CHF - most recent echocardiogram performed on 09/14/2022, which was notable for LVEF 20 to 25%, moderately dilated left ventricle, grade 3 diastolic dysfunction, severely dilated left atrium, mildly reduced right ventricular systolic function, moderate mitral regurgitation and mild tricuspid regurgitation.  He is status post ICD placement in September 2022.  -Compensated, he is a euvolemic, continue with home dapagliflozin  , Entresto , Aldactone , continue with lower  dose Coreg  -Will hold on resuming torsemide for now as appears compensated, and he presents with sepsis  OSA -Continue with CPAP  DVT prophylaxis: Lovenox  Code Status: Full Family Communication: D/W patient Disposition:   Status is: Inpatient    Consultants:  none   Subjective:  Reports sore throat this morning, reports dyspnea has improved, but still reports generalized weakness, fatigue  Objective: Vitals:   04/17/23 0403 04/17/23 0500 04/17/23 0736 04/17/23 0800  BP: 123/81  (!) 140/74 (!) 135/96  Pulse: 72  72 75  Resp: 20  (!) 28 (!) 22  Temp: 97.9 F (36.6 C)     TempSrc: Oral     SpO2: 94%  94% 97%  Weight:  107 kg    Height:       No intake or output data in the 24 hours ending 04/17/23 1118  Filed Weights   04/15/23 1035 04/17/23 0500  Weight: 97.5 kg 107 kg    Examination:  Awake Alert, Oriented X 3, No new F.N deficits, Normal affect Symmetrical Chest wall movement, wheezing much improved RRR,No Gallops,Rubs or new Murmurs, No Parasternal Heave +ve B.Sounds, Abd Soft, No tenderness, No rebound - guarding or rigidity. No Cyanosis, Clubbing or edema, No new Rash or bruise      Data Reviewed: I have personally reviewed following labs and imaging studies  CBC: Recent Labs  Lab 04/15/23 1225 04/16/23 0603 04/17/23 0454  WBC 6.2 4.8 2.8*  NEUTROABS 4.9 3.5  --   HGB 13.2 13.9 14.5  HCT 40.4 42.1 44.4  MCV 82.3 82.4 81.8  PLT 148* 138* 146*    Basic Metabolic Panel: Recent Labs  Lab  04/15/23 1225 04/15/23 2101 04/16/23 0603 04/17/23 0454  NA 134*  --  137 136  K 3.9  --  3.9 4.2  CL 100  --  101 101  CO2 23  --  23 22  GLUCOSE 113*  --  117* 189*  BUN 14  --  17 22*  CREATININE 0.93  --  1.12 1.11  CALCIUM  8.7*  --  8.5* 8.9  MG  --  1.7 1.9  --   PHOS  --   --  3.8  --     GFR: Estimated Creatinine Clearance: 100.7 mL/min (by C-G formula based on SCr of 1.11 mg/dL).  Liver Function Tests: Recent Labs  Lab 04/16/23 0603   AST 37  ALT 28  ALKPHOS 27*  BILITOT 1.2  PROT 6.9  ALBUMIN 3.5    CBG: No results for input(s): GLUCAP in the last 168 hours.   Recent Results (from the past 240 hours)  Resp panel by RT-PCR (RSV, Flu A&B, Covid) Anterior Nasal Swab     Status: Abnormal   Collection Time: 04/15/23 10:39 AM   Specimen: Anterior Nasal Swab  Result Value Ref Range Status   SARS Coronavirus 2 by RT PCR NEGATIVE NEGATIVE Final    Comment: (NOTE) SARS-CoV-2 target nucleic acids are NOT DETECTED.  The SARS-CoV-2 RNA is generally detectable in upper respiratory specimens during the acute phase of infection. The lowest concentration of SARS-CoV-2 viral copies this assay can detect is 138 copies/mL. A negative result does not preclude SARS-Cov-2 infection and should not be used as the sole basis for treatment or other patient management decisions. A negative result may occur with  improper specimen collection/handling, submission of specimen other than nasopharyngeal swab, presence of viral mutation(s) within the areas targeted by this assay, and inadequate number of viral copies(<138 copies/mL). A negative result must be combined with clinical observations, patient history, and epidemiological information. The expected result is Negative.  Fact Sheet for Patients:  bloggercourse.com  Fact Sheet for Healthcare Providers:  seriousbroker.it  This test is no t yet approved or cleared by the [place]  FDA and  has been authorized for detection and/or diagnosis of SARS-CoV-2 by FDA under an Emergency Use Authorization (EUA). This EUA will remain  in effect (meaning this test can be used) for the duration of the COVID-19 declaration under Section 564(b)(1) of the Act, 21 U.S.C.section 360bbb-3(b)(1), unless the authorization is terminated  or revoked sooner.       Influenza A by PCR POSITIVE (A) NEGATIVE Final   Influenza B by PCR NEGATIVE  NEGATIVE Final    Comment: (NOTE) The Xpert Xpress SARS-CoV-2/FLU/RSV plus assay is intended as an aid in the diagnosis of influenza from Nasopharyngeal swab specimens and should not be used as a sole basis for treatment. Nasal washings and aspirates are unacceptable for Xpert Xpress SARS-CoV-2/FLU/RSV testing.  Fact Sheet for Patients: bloggercourse.com  Fact Sheet for Healthcare Providers: seriousbroker.it  This test is not yet approved or cleared by the [place]  FDA and has been authorized for detection and/or diagnosis of SARS-CoV-2 by FDA under an Emergency Use Authorization (EUA). This EUA will remain in effect (meaning this test can be used) for the duration of the COVID-19 declaration under Section 564(b)(1) of the Act, 21 U.S.C. section 360bbb-3(b)(1), unless the authorization is terminated or revoked.     Resp Syncytial Virus by PCR NEGATIVE NEGATIVE Final    Comment: (NOTE) Fact Sheet for Patients: bloggercourse.com  Fact Sheet for Healthcare Providers:  seriousbroker.it  This test is not yet approved or cleared by the [place]  FDA and has been authorized for detection and/or diagnosis of SARS-CoV-2 by FDA under an Emergency Use Authorization (EUA). This EUA will remain in effect (meaning this test can be used) for the duration of the COVID-19 declaration under Section 564(b)(1) of the Act, 21 U.S.C. section 360bbb-3(b)(1), unless the authorization is terminated or revoked.  Performed at [facility], [address] [place], [place] [address]   Culture, blood (Routine X 2) w Reflex to ID Panel     Status: None (Preliminary result)   Collection Time: 04/15/23  9:04 PM   Specimen: BLOOD LEFT HAND  Result Value Ref Range Status   Specimen Description BLOOD LEFT HAND  Final   Special Requests   Final    BOTTLES DRAWN AEROBIC AND ANAEROBIC Blood  Culture results may not be optimal due to an inadequate volume of blood received in culture bottles   Culture   Final    NO GROWTH 2 DAYS Performed at [facility], [address], [place], [place] [address]    Report Status PENDING  Incomplete  Culture, blood (Routine X 2) w Reflex to ID Panel     Status: None (Preliminary result)   Collection Time: 04/15/23  9:04 PM   Specimen: BLOOD RIGHT HAND  Result Value Ref Range Status   Specimen Description BLOOD RIGHT HAND  Final   Special Requests   Final    BOTTLES DRAWN AEROBIC AND ANAEROBIC Blood Culture results may not be optimal due to an inadequate volume of blood received in culture bottles   Culture   Final    NO GROWTH 2 DAYS Performed at [facility], [address], [place], [place] [address]    Report Status PENDING  Incomplete  Respiratory (~20 pathogens) panel by PCR     Status: Abnormal   Collection Time: 04/16/23  2:15 PM   Specimen: Nasopharyngeal Swab; Respiratory  Result Value Ref Range Status   Adenovirus NOT DETECTED NOT DETECTED Final   Coronavirus 229E NOT DETECTED NOT DETECTED Final    Comment: (NOTE) The Coronavirus on the Respiratory Panel, DOES NOT test for the novel  Coronavirus (2019 nCoV)    Coronavirus HKU1 NOT DETECTED NOT DETECTED Final   Coronavirus NL63 NOT DETECTED NOT DETECTED Final   Coronavirus OC43 NOT DETECTED NOT DETECTED Final   Metapneumovirus NOT DETECTED NOT DETECTED Final   Rhinovirus / Enterovirus NOT DETECTED NOT DETECTED Final   Influenza A H1 2009 DETECTED (A) NOT DETECTED Final   Influenza B NOT DETECTED NOT DETECTED Final   Parainfluenza Virus 1 NOT DETECTED NOT DETECTED Final   Parainfluenza Virus 2 NOT DETECTED NOT DETECTED Final   Parainfluenza Virus 3 NOT DETECTED NOT DETECTED Final   Parainfluenza Virus 4 NOT DETECTED NOT DETECTED Final   Respiratory Syncytial Virus NOT DETECTED NOT DETECTED Final   Bordetella pertussis NOT DETECTED NOT DETECTED Final    Bordetella Parapertussis NOT DETECTED NOT DETECTED Final   Chlamydophila pneumoniae NOT DETECTED NOT DETECTED Final   Mycoplasma pneumoniae NOT DETECTED NOT DETECTED Final    Comment: Performed at [facility], [address] [place], [place] [address]         Radiology Studies: No results found.       Scheduled Meds:  atorvastatin   20 mg Oral Daily   carvedilol   25 mg Oral BID WC   dapagliflozin  propanediol  10  mg Oral QAC breakfast   enoxaparin  (LOVENOX ) injection  40 mg Subcutaneous Q24H   guaiFENesin   600 mg Oral BID   magic mouthwash  5 mL Oral QID   methylPREDNISolone  (SOLU-MEDROL ) injection  80 mg Intravenous Daily   oseltamivir   75 mg Oral BID   pantoprazole   40 mg Oral Daily   sacubitril -valsartan   1 tablet Oral BID   spironolactone   25 mg Oral Daily   Continuous Infusions:   LOS: 2 days       Brayton Lye, MD [facility]   To contact the attending provider between 7A-7P or the covering provider during after hours 7P-7A, please log into the web site [email or web address] and access using universal [place] password for that web site. If you do not have the password, please call the hospital operator.  04/17/2023, 11:18 AM

## 2023-04-18 DIAGNOSIS — J101 Influenza due to other identified influenza virus with other respiratory manifestations: Secondary | ICD-10-CM | POA: Diagnosis not present

## 2023-04-18 MED ORDER — PANTOPRAZOLE SODIUM 40 MG PO TBEC: 40.0000 mg | DELAYED_RELEASE_TABLET | Freq: Every day | ORAL | 0 refills | Status: AC

## 2023-04-18 MED ORDER — AMOXICILLIN-POT CLAVULANATE 875-125 MG PO TABS: 1.0000 | ORAL_TABLET | Freq: Two times a day (BID) | ORAL | 0 refills | Status: AC

## 2023-04-18 MED ORDER — OSELTAMIVIR PHOSPHATE 75 MG PO CAPS: 75.0000 mg | ORAL_CAPSULE | Freq: Two times a day (BID) | ORAL | 0 refills | Status: AC

## 2023-04-18 NOTE — Discharge Summary (Signed)
 Physician Discharge Summary  Jeffery Glenn DOB: Jul 03, 1970 DOA: 04/15/2023  PCP: Rosalea Rosina SAILOR, PA  Admit date: 04/15/2023 Discharge date: 04/18/2023  Admitted From: (Home) Disposition:  (Home)  Recommendations for Outpatient Follow-up:  Follow up with PCP in 1-2 weeks Please obtain BMP/CBC in one week    Diet recommendation: Heart Healthy   Brief/Interim Summary:  Jeffery Glenn is a 53 y.o. 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      Severe sepsis due to influenza A infection Acute respiratory failure with hypoxia -Workup significant for positive influenza A PCR -Patient with wheezing, hypoxia initially, so was started on IV steroids, as well treated with Tamiflu , Rocephin , respiratory status much improved, no further wheezing, hypoxia has resolved, he will be discharged to finish his course of Tamiflu , and antibiotics. -He was encouraged use incentive spirometry and flutter valve at home   Hypertension -Resume home medications,    Chronic combined systolic and diastolic CHF - most recent echocardiogram performed on 09/14/2022, which was notable for LVEF 20 to 25%, moderately dilated left ventricle, grade 3 diastolic dysfunction, severely dilated left atrium, mildly reduced right ventricular systolic function, moderate mitral regurgitation and mild tricuspid regurgitation.  He is status post ICD placement in September 2022.  -Compensated, he is a euvolemic, continue with home dapagliflozin  , Entresto , Aldactone , and Aldactone    Leukopenia -White blood cell low at 2.8, this is not likely due to flu.  OSA -Continue with CPAP  Discharge Diagnoses:   Principal Problem:   Influenza A Active Problems:   Essential hypertension   Severe sepsis (HCC)   Fever   Acute hypoxic respiratory failure (HCC)   Chronic combined systolic and diastolic heart failure [facility])    Discharge Instructions  Discharge Instructions     Diet - low sodium heart healthy   Complete by: As directed    Increase activity slowly   Complete by: As directed       Allergies as of 04/18/2023       Reactions   Shellfish Allergy Other (See Comments)   hives   Hydrocodone     Ibuprofen     Peanut-containing Drug Products Other (See Comments)   Allergy test/ Patient eats peanuts        Medication List     TAKE these medications    amoxicillin -clavulanate 875-125 MG tablet Commonly known as: AUGMENTIN  Take 1 tablet by mouth 2 (two) times daily for 3 days. Start taking on: April 19, 2023   atorvastatin  20 MG tablet Commonly known as: LIPITOR Take 1 tablet (20 mg total) by mouth daily.   carvedilol  25 MG tablet Commonly known as: COREG  Take 1.5 tablets (37.5 mg total) by mouth 2 (two) times daily with a meal.   dapagliflozin  propanediol 10 MG Tabs tablet Commonly known as: Farxiga  Take 1 tablet (10 mg total) by mouth daily before breakfast.   Entresto  97-103 MG Generic drug: sacubitril -valsartan  Take 1 tablet by mouth 2 (two) times daily.   furosemide  40 MG tablet Commonly known as: LASIX  Take 1 tablet (40 mg total) by mouth daily as needed for fluid or edema (Weight gain >3 Lbs in 3 days).   oseltamivir  75 MG capsule Commonly known as: TAMIFLU   Take 1 capsule (75 mg total) by mouth 2 (two) times daily for 3 doses.   pantoprazole  40 MG tablet Commonly known as: Protonix  Take 1 tablet (40 mg total) by mouth daily.   spironolactone  25 MG tablet Commonly known as: ALDACTONE  Take one (1) tablet by mouth ( 25 mg) daily.        Allergies  Allergen Reactions   Shellfish Allergy Other (See Comments)    hives   Hydrocodone      Ibuprofen     Peanut-Containing Drug Products Other (See Comments)    Allergy test/ Patient eats peanuts    Consultations: None   Procedures/Studies: DG Chest 2 View Result Date: 04/15/2023 CLINICAL DATA:  Cough. EXAM: CHEST - 2 VIEW COMPARISON:  02/10/2023 FINDINGS: Stable cardiomegaly and central pulmonary vascular congestion. Internal defibrillator device again seen. Mild scarring again seen in the left midlung. The lungs are otherwise clear. IMPRESSION: Stable cardiomegaly and pulmonary vascular congestion. No active lung disease. Electronically Signed   By: Norleen DELENA Kil M.D.   On: 04/15/2023 11:46    Subjective: Patient denies any dyspnea, still reports some cough, as well reports some sore throat, but overall reports he is feeling better.  Discharge Exam: Vitals:   04/18/23 0400 04/18/23 0816  BP: (!) 133/98 (!) 136/93  Pulse: 70 71  Resp: 17 19  Temp: 98.2 F (36.8 C) 98.2 F (36.8 C)  SpO2: 95% 92%   Vitals:   04/17/23 2024 04/18/23 0000 04/18/23 0400 04/18/23 0816  BP: 105/72 113/69 (!) 133/98 (!) 136/93  Pulse: 70 69 70 71  Resp: 17 14 17 19   Temp: 98.3 F (36.8 C) 98 F (36.7 C) 98.2 F (36.8 C) 98.2 F (36.8 C)  TempSrc: Oral Oral Oral Oral  SpO2: 95% 93% 95% 92%  Weight:      Height:        General: Pt is alert, awake, not in acute distress Cardiovascular: RRR, S1/S2 +, no rubs, no gallops Respiratory: CTA bilaterally, no wheezing, no rhonchi Abdominal: Soft, NT, ND, bowel sounds + Extremities: no edema, no cyanosis    The results of significant diagnostics from this hospitalization (including imaging, microbiology, ancillary and laboratory) are listed below for reference.     Microbiology: Recent Results (from the past 240 hours)  Resp panel by RT-PCR (RSV, Flu A&B, Covid) Anterior Nasal Swab     Status: Abnormal   Collection Time: 04/15/23 10:39 AM   Specimen: Anterior Nasal Swab  Result Value Ref Range Status   SARS Coronavirus 2 by RT PCR  NEGATIVE NEGATIVE Final    Comment: (NOTE) SARS-CoV-2 target nucleic acids are NOT DETECTED.  The SARS-CoV-2 RNA is generally detectable in upper respiratory specimens during the acute phase of infection. The lowest concentration of SARS-CoV-2 viral copies this assay can detect is 138 copies/mL. A negative result does not preclude SARS-Cov-2 infection and should not be used as the sole basis for treatment or other patient management decisions. A negative result may occur with  improper specimen collection/handling, submission of specimen other than nasopharyngeal swab, presence of viral mutation(s) within the areas targeted by this assay, and inadequate number of viral copies(<138 copies/mL). A negative result must be combined with clinical observations, patient history, and epidemiological information. The expected result is Negative.  Fact Sheet for Patients:  bloggercourse.com  Fact Sheet for Healthcare Providers:  seriousbroker.it  This test is no t yet approved or cleared by the [place]  FDA and  has been authorized for detection and/or diagnosis of  SARS-CoV-2 by FDA under an Emergency Use Authorization (EUA). This EUA will remain  in effect (meaning this test can be used) for the duration of the COVID-19 declaration under Section 564(b)(1) of the Act, 21 U.S.C.section 360bbb-3(b)(1), unless the authorization is terminated  or revoked sooner.       Influenza A by PCR POSITIVE (A) NEGATIVE Final   Influenza B by PCR NEGATIVE NEGATIVE Final    Comment: (NOTE) The Xpert Xpress SARS-CoV-2/FLU/RSV plus assay is intended as an aid in the diagnosis of influenza from Nasopharyngeal swab specimens and should not be used as a sole basis for treatment. Nasal washings and aspirates are unacceptable for Xpert Xpress SARS-CoV-2/FLU/RSV testing.  Fact Sheet for Patients: bloggercourse.com  Fact Sheet for  Healthcare Providers: seriousbroker.it  This test is not yet approved or cleared by the [place]  FDA and has been authorized for detection and/or diagnosis of SARS-CoV-2 by FDA under an Emergency Use Authorization (EUA). This EUA will remain in effect (meaning this test can be used) for the duration of the COVID-19 declaration under Section 564(b)(1) of the Act, 21 U.S.C. section 360bbb-3(b)(1), unless the authorization is terminated or revoked.     Resp Syncytial Virus by PCR NEGATIVE NEGATIVE Final    Comment: (NOTE) Fact Sheet for Patients: bloggercourse.com  Fact Sheet for Healthcare Providers: seriousbroker.it  This test is not yet approved or cleared by the [place]  FDA and has been authorized for detection and/or diagnosis of SARS-CoV-2 by FDA under an Emergency Use Authorization (EUA). This EUA will remain in effect (meaning this test can be used) for the duration of the COVID-19 declaration under Section 564(b)(1) of the Act, 21 U.S.C. section 360bbb-3(b)(1), unless the authorization is terminated or revoked.  Performed at [facility], [address] [place], [place] [address]   Culture, blood (Routine X 2) w Reflex to ID Panel     Status: None (Preliminary result)   Collection Time: 04/15/23  9:04 PM   Specimen: BLOOD LEFT HAND  Result Value Ref Range Status   Specimen Description BLOOD LEFT HAND  Final   Special Requests   Final    BOTTLES DRAWN AEROBIC AND ANAEROBIC Blood Culture results may not be optimal due to an inadequate volume of blood received in culture bottles   Culture   Final    NO GROWTH 3 DAYS Performed at [facility], [address], [place], [place] [address]    Report Status PENDING  Incomplete  Culture, blood (Routine X 2) w Reflex to ID Panel     Status: None (Preliminary result)   Collection Time: 04/15/23  9:04 PM   Specimen: BLOOD RIGHT  HAND  Result Value Ref Range Status   Specimen Description BLOOD RIGHT HAND  Final   Special Requests   Final    BOTTLES DRAWN AEROBIC AND ANAEROBIC Blood Culture results may not be optimal due to an inadequate volume of blood received in culture bottles   Culture   Final    NO GROWTH 3 DAYS Performed at [facility], [address], [place], [place] [address]    Report Status PENDING  Incomplete  Respiratory (~20 pathogens) panel by PCR     Status: Abnormal   Collection Time: 04/16/23  2:15 PM   Specimen: Nasopharyngeal Swab; Respiratory  Result Value Ref Range Status   Adenovirus NOT DETECTED NOT DETECTED Final   Coronavirus 229E NOT DETECTED NOT DETECTED Final    Comment: (NOTE) The Coronavirus  on the Respiratory Panel, DOES NOT test for the novel  Coronavirus (2019 nCoV)    Coronavirus HKU1 NOT DETECTED NOT DETECTED Final   Coronavirus NL63 NOT DETECTED NOT DETECTED Final   Coronavirus OC43 NOT DETECTED NOT DETECTED Final   Metapneumovirus NOT DETECTED NOT DETECTED Final   Rhinovirus / Enterovirus NOT DETECTED NOT DETECTED Final   Influenza A H1 2009 DETECTED (A) NOT DETECTED Final   Influenza B NOT DETECTED NOT DETECTED Final   Parainfluenza Virus 1 NOT DETECTED NOT DETECTED Final   Parainfluenza Virus 2 NOT DETECTED NOT DETECTED Final   Parainfluenza Virus 3 NOT DETECTED NOT DETECTED Final   Parainfluenza Virus 4 NOT DETECTED NOT DETECTED Final   Respiratory Syncytial Virus NOT DETECTED NOT DETECTED Final   Bordetella pertussis NOT DETECTED NOT DETECTED Final   Bordetella Parapertussis NOT DETECTED NOT DETECTED Final   Chlamydophila pneumoniae NOT DETECTED NOT DETECTED Final   Mycoplasma pneumoniae NOT DETECTED NOT DETECTED Final    Comment: Performed at [facility], [address], [place], [place] [address]     Labs: BNP (last 3 results) Recent Labs    02/10/23 2206 04/15/23 1225 04/16/23 0603  BNP 835.4* 1,765.1* 2,403.2*   Basic Metabolic  Panel: Recent Labs  Lab 04/15/23 1225 04/15/23 2101 04/16/23 0603 04/17/23 0454  NA 134*  --  137 136  K 3.9  --  3.9 4.2  CL 100  --  101 101  CO2 23  --  23 22  GLUCOSE 113*  --  117* 189*  BUN 14  --  17 22*  CREATININE 0.93  --  1.12 1.11  CALCIUM  8.7*  --  8.5* 8.9  MG  --  1.7 1.9  --   PHOS  --   --  3.8  --    Liver Function Tests: Recent Labs  Lab 04/16/23 0603  AST 37  ALT 28  ALKPHOS 27*  BILITOT 1.2  PROT 6.9  ALBUMIN 3.5   No results for input(s): LIPASE, AMYLASE in the last 168 hours. No results for input(s): AMMONIA in the last 168 hours. CBC: Recent Labs  Lab 04/15/23 1225 04/16/23 0603 04/17/23 0454  WBC 6.2 4.8 2.8*  NEUTROABS 4.9 3.5  --   HGB 13.2 13.9 14.5  HCT 40.4 42.1 44.4  MCV 82.3 82.4 81.8  PLT 148* 138* 146*   Cardiac Enzymes: No results for input(s): CKTOTAL, CKMB, CKMBINDEX, TROPONINI in the last 168 hours. BNP: Invalid input(s): POCBNP CBG: No results for input(s): GLUCAP in the last 168 hours. D-Dimer No results for input(s): DDIMER in the last 72 hours. Hgb A1c No results for input(s): HGBA1C in the last 72 hours. Lipid Profile No results for input(s): CHOL, HDL, LDLCALC, TRIG, CHOLHDL, LDLDIRECT in the last 72 hours. Thyroid function studies No results for input(s): TSH, T4TOTAL, T3FREE, THYROIDAB in the last 72 hours.  Invalid input(s): FREET3 Anemia work up No results for input(s): VITAMINB12, FOLATE, FERRITIN, TIBC, IRON, RETICCTPCT in the last 72 hours. Urinalysis    Component Value Date/Time   COLORURINE YELLOW 08/01/2021 0531   APPEARANCEUR CLEAR 08/01/2021 0531   LABSPEC 1.025 08/01/2021 0531   PHURINE 5.5 08/01/2021 0531   GLUCOSEU NEGATIVE 08/01/2021 0531   HGBUR NEGATIVE 08/01/2021 0531   BILIRUBINUR NEGATIVE 08/01/2021 0531   KETONESUR NEGATIVE 08/01/2021 0531   PROTEINUR NEGATIVE 08/01/2021 0531   NITRITE NEGATIVE 08/01/2021 0531    LEUKOCYTESUR NEGATIVE 08/01/2021 0531   Sepsis Labs Recent Labs  Lab 04/15/23  1225 04/16/23 0603 04/17/23 0454  WBC 6.2 4.8 2.8*   Microbiology Recent Results (from the past 240 hours)  Resp panel by RT-PCR (RSV, Flu A&B, Covid) Anterior Nasal Swab     Status: Abnormal   Collection Time: 04/15/23 10:39 AM   Specimen: Anterior Nasal Swab  Result Value Ref Range Status   SARS Coronavirus 2 by RT PCR NEGATIVE NEGATIVE Final    Comment: (NOTE) SARS-CoV-2 target nucleic acids are NOT DETECTED.  The SARS-CoV-2 RNA is generally detectable in upper respiratory specimens during the acute phase of infection. The lowest concentration of SARS-CoV-2 viral copies this assay can detect is 138 copies/mL. A negative result does not preclude SARS-Cov-2 infection and should not be used as the sole basis for treatment or other patient management decisions. A negative result may occur with  improper specimen collection/handling, submission of specimen other than nasopharyngeal swab, presence of viral mutation(s) within the areas targeted by this assay, and inadequate number of viral copies(<138 copies/mL). A negative result must be combined with clinical observations, patient history, and epidemiological information. The expected result is Negative.  Fact Sheet for Patients:  bloggercourse.com  Fact Sheet for Healthcare Providers:  seriousbroker.it  This test is no t yet approved or cleared by the [place]  FDA and  has been authorized for detection and/or diagnosis of SARS-CoV-2 by FDA under an Emergency Use Authorization (EUA). This EUA will remain  in effect (meaning this test can be used) for the duration of the COVID-19 declaration under Section 564(b)(1) of the Act, 21 U.S.C.section 360bbb-3(b)(1), unless the authorization is terminated  or revoked sooner.       Influenza A by PCR POSITIVE (A) NEGATIVE Final   Influenza B by PCR  NEGATIVE NEGATIVE Final    Comment: (NOTE) The Xpert Xpress SARS-CoV-2/FLU/RSV plus assay is intended as an aid in the diagnosis of influenza from Nasopharyngeal swab specimens and should not be used as a sole basis for treatment. Nasal washings and aspirates are unacceptable for Xpert Xpress SARS-CoV-2/FLU/RSV testing.  Fact Sheet for Patients: bloggercourse.com  Fact Sheet for Healthcare Providers: seriousbroker.it  This test is not yet approved or cleared by the [place]  FDA and has been authorized for detection and/or diagnosis of SARS-CoV-2 by FDA under an Emergency Use Authorization (EUA). This EUA will remain in effect (meaning this test can be used) for the duration of the COVID-19 declaration under Section 564(b)(1) of the Act, 21 U.S.C. section 360bbb-3(b)(1), unless the authorization is terminated or revoked.     Resp Syncytial Virus by PCR NEGATIVE NEGATIVE Final    Comment: (NOTE) Fact Sheet for Patients: bloggercourse.com  Fact Sheet for Healthcare Providers: seriousbroker.it  This test is not yet approved or cleared by the [place]  FDA and has been authorized for detection and/or diagnosis of SARS-CoV-2 by FDA under an Emergency Use Authorization (EUA). This EUA will remain in effect (meaning this test can be used) for the duration of the COVID-19 declaration under Section 564(b)(1) of the Act, 21 U.S.C. section 360bbb-3(b)(1), unless the authorization is terminated or revoked.  Performed at [facility], [address] [place], [place] [address]   Culture, blood (Routine X 2) w Reflex to ID Panel     Status: None (Preliminary result)   Collection Time: 04/15/23  9:04 PM   Specimen: BLOOD LEFT HAND  Result Value Ref Range Status   Specimen Description BLOOD LEFT HAND  Final   Special Requests   Final    BOTTLES  DRAWN AEROBIC AND  ANAEROBIC Blood Culture results may not be optimal due to an inadequate volume of blood received in culture bottles   Culture   Final    NO GROWTH 3 DAYS Performed at [facility], [address], [place], [place] [address]    Report Status PENDING  Incomplete  Culture, blood (Routine X 2) w Reflex to ID Panel     Status: None (Preliminary result)   Collection Time: 04/15/23  9:04 PM   Specimen: BLOOD RIGHT HAND  Result Value Ref Range Status   Specimen Description BLOOD RIGHT HAND  Final   Special Requests   Final    BOTTLES DRAWN AEROBIC AND ANAEROBIC Blood Culture results may not be optimal due to an inadequate volume of blood received in culture bottles   Culture   Final    NO GROWTH 3 DAYS Performed at [facility], [address] [place], [place] [address]    Report Status PENDING  Incomplete  Respiratory (~20 pathogens) panel by PCR     Status: Abnormal   Collection Time: 04/16/23  2:15 PM   Specimen: Nasopharyngeal Swab; Respiratory  Result Value Ref Range Status   Adenovirus NOT DETECTED NOT DETECTED Final   Coronavirus 229E NOT DETECTED NOT DETECTED Final    Comment: (NOTE) The Coronavirus on the Respiratory Panel, DOES NOT test for the novel  Coronavirus (2019 nCoV)    Coronavirus HKU1 NOT DETECTED NOT DETECTED Final   Coronavirus NL63 NOT DETECTED NOT DETECTED Final   Coronavirus OC43 NOT DETECTED NOT DETECTED Final   Metapneumovirus NOT DETECTED NOT DETECTED Final   Rhinovirus / Enterovirus NOT DETECTED NOT DETECTED Final   Influenza A H1 2009 DETECTED (A) NOT DETECTED Final   Influenza B NOT DETECTED NOT DETECTED Final   Parainfluenza Virus 1 NOT DETECTED NOT DETECTED Final   Parainfluenza Virus 2 NOT DETECTED NOT DETECTED Final   Parainfluenza Virus 3 NOT DETECTED NOT DETECTED Final   Parainfluenza Virus 4 NOT DETECTED NOT DETECTED Final   Respiratory Syncytial Virus NOT DETECTED NOT DETECTED Final   Bordetella pertussis NOT DETECTED NOT DETECTED  Final   Bordetella Parapertussis NOT DETECTED NOT DETECTED Final   Chlamydophila pneumoniae NOT DETECTED NOT DETECTED Final   Mycoplasma pneumoniae NOT DETECTED NOT DETECTED Final    Comment: Performed at [facility], [address] [place], [place] [address]     Time coordinating discharge: Over 30 minutes  SIGNED:   Brayton Lye, MD  [facility] 04/18/2023, 10:58 AM Pager   If 7PM-7AM, please contact night-coverage [email or web address] Password [email or web address]

## 2023-04-18 NOTE — Discharge Instructions (Signed)
 Follow with Primary MD Rosalea Rosina SAILOR, PA in 7 days   Get CBC, CMP,  checked  by Primary MD next visit.    Activity: As tolerated with Full fall precautions use walker/cane & assistance as needed   Disposition Home    Diet: Heart Healthy  - Follow Cardiac Low Salt Diet and 1.5 lit/day fluid restriction.   On your next visit with your primary care physician please Get Medicines reviewed and adjusted.   Please request your Prim.MD to go over all Hospital Tests and Procedure/Radiological results at the follow up, please get all Hospital records sent to your Prim MD by signing hospital release before you go home.   If you experience worsening of your admission symptoms, develop shortness of breath, life threatening emergency, suicidal or homicidal thoughts you must seek medical attention immediately by calling 911 or calling your MD immediately  if symptoms less severe.  You Must read complete instructions/literature along with all the possible adverse reactions/side effects for all the Medicines you take and that have been prescribed to you. Take any new Medicines after you have completely understood and accpet all the possible adverse reactions/side effects.   Do not drive, operating heavy machinery, perform activities at heights, swimming or participation in water activities or provide baby sitting services if your were admitted for syncope or siezures until you have seen by Primary MD or a Neurologist and advised to do so again.  Do not drive when taking Pain medications.    Do not take more than prescribed Pain, Sleep and Anxiety Medications  Special Instructions: If you have smoked or chewed Tobacco  in the last 2 yrs please stop smoking, stop any regular Alcohol  and or any Recreational drug use.  Wear Seat belts while driving.   Please note  You were cared for by a hospitalist during your hospital stay. If you have any questions about your discharge medications or the  care you received while you were in the hospital after you are discharged, you can call the unit and asked to speak with the hospitalist on call if the hospitalist that took care of you is not available. Once you are discharged, your primary care physician will handle any further medical issues. Please note that NO REFILLS for any discharge medications will be authorized once you are discharged, as it is imperative that you return to your primary care physician (or establish a relationship with a primary care physician if you do not have one) for your aftercare needs so that they can reassess your need for medications and monitor your lab values.

## 2023-04-18 NOTE — Plan of Care (Signed)

## 2023-04-18 NOTE — Plan of Care (Signed)
   Problem: Education: Goal: Knowledge of General Education information will improve Description Including pain rating scale, medication(s)/side effects and non-pharmacologic comfort measures Outcome: Progressing   Problem: Clinical Measurements: Goal: Ability to maintain clinical measurements within normal limits will improve Outcome: Progressing   Problem: Clinical Measurements: Goal: Will remain free from infection Outcome: Progressing

## 2023-04-18 NOTE — Progress Notes (Signed)
 Discharge paperwork reviewed with patient at this time. No complaints of pain have been made at this time. Iv has been removed. Wheelchair access provided to transfer patient to discharge vehicle.

## 2023-04-18 NOTE — Progress Notes (Signed)
 Patient is alert, awake, ambulated independently. Temperatures are WNL. Pt was given 200 mg  Tessalon . Family  is at bedside.

## 2023-04-20 LAB — CULTURE, BLOOD (ROUTINE X 2)
Culture: NO GROWTH
Culture: NO GROWTH

## 2023-05-03 ENCOUNTER — Ambulatory Visit: Payer: Medicaid Other | Admitting: Adult Health

## 2023-05-08 ENCOUNTER — Emergency Department (HOSPITAL_BASED_OUTPATIENT_CLINIC_OR_DEPARTMENT_OTHER)

## 2023-05-08 ENCOUNTER — Emergency Department (HOSPITAL_BASED_OUTPATIENT_CLINIC_OR_DEPARTMENT_OTHER)
Admission: EM | Admit: 2023-05-08 | Discharge: 2023-05-08 | Disposition: A | Discharge status: Home / Self Care | Attending: Emergency Medicine | Admitting: Emergency Medicine

## 2023-05-08 ENCOUNTER — Other Ambulatory Visit: Payer: Self-pay

## 2023-05-08 ENCOUNTER — Encounter (HOSPITAL_BASED_OUTPATIENT_CLINIC_OR_DEPARTMENT_OTHER): Payer: Self-pay | Admitting: Emergency Medicine

## 2023-05-08 DIAGNOSIS — I5042 Chronic combined systolic (congestive) and diastolic (congestive) heart failure: Secondary | ICD-10-CM | POA: Diagnosis not present

## 2023-05-08 DIAGNOSIS — E876 Hypokalemia: Secondary | ICD-10-CM | POA: Diagnosis not present

## 2023-05-08 DIAGNOSIS — J069 Acute upper respiratory infection, unspecified: Secondary | ICD-10-CM | POA: Insufficient documentation

## 2023-05-08 DIAGNOSIS — I110 Hypertensive heart disease with heart failure: Secondary | ICD-10-CM | POA: Insufficient documentation

## 2023-05-08 DIAGNOSIS — R059 Cough, unspecified: Secondary | ICD-10-CM | POA: Diagnosis present

## 2023-05-08 DIAGNOSIS — B974 Respiratory syncytial virus as the cause of diseases classified elsewhere: Secondary | ICD-10-CM | POA: Insufficient documentation

## 2023-05-08 DIAGNOSIS — B338 Other specified viral diseases: Secondary | ICD-10-CM

## 2023-05-08 DIAGNOSIS — Z79899 Other long term (current) drug therapy: Secondary | ICD-10-CM | POA: Insufficient documentation

## 2023-05-08 DIAGNOSIS — I10 Essential (primary) hypertension: Secondary | ICD-10-CM

## 2023-05-08 LAB — CBC WITH DIFFERENTIAL/PLATELET
Abs Immature Granulocytes: 0 10*3/uL (ref 0.00–0.07)
Basophils Absolute: 0 10*3/uL (ref 0.0–0.1)
Basophils Relative: 1 %
Eosinophils Absolute: 0.3 10*3/uL (ref 0.0–0.5)
Eosinophils Relative: 6 %
HCT: 41.3 % (ref 39.0–52.0)
Hemoglobin: 13.4 g/dL (ref 13.0–17.0)
Immature Granulocytes: 0 %
Lymphocytes Relative: 29 %
Lymphs Abs: 1.3 10*3/uL (ref 0.7–4.0)
MCH: 26.5 pg (ref 26.0–34.0)
MCHC: 32.4 g/dL (ref 30.0–36.0)
MCV: 81.8 fL (ref 80.0–100.0)
Monocytes Absolute: 0.7 10*3/uL (ref 0.1–1.0)
Monocytes Relative: 16 %
Neutro Abs: 2.3 10*3/uL (ref 1.7–7.7)
Neutrophils Relative %: 48 %
Platelets: 174 10*3/uL (ref 150–400)
RBC: 5.05 MIL/uL (ref 4.22–5.81)
RDW: 15.8 % — ABNORMAL HIGH (ref 11.5–15.5)
WBC: 4.7 10*3/uL (ref 4.0–10.5)
nRBC: 0 % (ref 0.0–0.2)

## 2023-05-08 LAB — COMPREHENSIVE METABOLIC PANEL
ALT: 16 U/L (ref 0–44)
AST: 19 U/L (ref 15–41)
Albumin: 4.1 g/dL (ref 3.5–5.0)
Alkaline Phosphatase: 35 U/L — ABNORMAL LOW (ref 38–126)
Anion gap: 10 (ref 5–15)
BUN: 15 mg/dL (ref 6–20)
CO2: 26 mmol/L (ref 22–32)
Calcium: 9 mg/dL (ref 8.9–10.3)
Chloride: 104 mmol/L (ref 98–111)
Creatinine, Ser: 1.08 mg/dL (ref 0.61–1.24)
GFR, Estimated: >60 mL/min (ref >60)
Glucose, Bld: 97 mg/dL (ref 70–99)
Potassium: 3.4 mmol/L — ABNORMAL LOW (ref 3.5–5.1)
Sodium: 140 mmol/L (ref 135–145)
Total Bilirubin: 0.7 mg/dL (ref 0.0–1.2)
Total Protein: 7.8 g/dL (ref 6.5–8.1)

## 2023-05-08 LAB — BRAIN NATRIURETIC PEPTIDE: B Natriuretic Peptide: 714.8 pg/mL — ABNORMAL HIGH (ref 0.0–100.0)

## 2023-05-08 LAB — RESP PANEL BY RT-PCR (RSV, FLU A&B, COVID)  RVPGX2
Influenza A by PCR: NEGATIVE
Influenza B by PCR: NEGATIVE
Resp Syncytial Virus by PCR: POSITIVE — AB
SARS Coronavirus 2 by RT PCR: NEGATIVE

## 2023-05-08 LAB — TROPONIN I (HIGH SENSITIVITY): Troponin I (High Sensitivity): 37 ng/L — ABNORMAL HIGH (ref <18)

## 2023-05-08 MED ORDER — BENZONATATE 100 MG PO CAPS: 100.0000 mg | ORAL_CAPSULE | Freq: Three times a day (TID) | ORAL | 0 refills | Status: DC

## 2023-05-08 MED ORDER — BENZONATATE 100 MG PO CAPS
100.0000 mg | ORAL_CAPSULE | Freq: Once | ORAL | Status: AC
  Administered 2023-05-08: 100 mg via ORAL
  Filled 2023-05-08: qty 1

## 2023-05-08 NOTE — ED Triage Notes (Signed)
 Pt c/o cough and SHOB x 2d; recently had flu; hx CHF

## 2023-05-08 NOTE — Discharge Instructions (Addendum)
 Please read and follow all provided instructions.  Your diagnoses today include:  1. RSV infection   2. Viral URI with cough   3. Hypertension, unspecified type    Tests performed today include: An EKG of your heart A chest x-ray: No pneumonia Cardiac enzymes - a blood test for heart muscle damage, in line with previous Blood counts and electrolytes Viral panel: Positive for RSV Heart failure test: Looked okay Vital signs. See below for your results today.   Medications prescribed:  Tessalon Perles: cough suppressant medication  Take any prescribed medications only as directed.  Follow-up instructions: Please follow-up with your primary care provider as soon as you can for further evaluation of your symptoms.   Return instructions:  SEEK IMMEDIATE MEDICAL ATTENTION IF: You have severe chest pain, especially if the pain is crushing or pressure-like and spreads to the arms, back, neck, or jaw, or if you have sweating, nausea or vomiting, or trouble with breathing. THIS IS AN EMERGENCY. Do not wait to see if the pain will go away. Get medical help at once. Call 911. DO NOT drive yourself to the hospital.  Your chest pain gets worse and does not go away after a few minutes of rest.  You have an attack of chest pain lasting longer than what you usually experience.  You have significant dizziness, if you pass out, or have trouble walking.  You have chest pain not typical of your usual pain for which you originally saw your caregiver.  You have any other emergent concerns regarding your health.  Additional Information: Chest pain comes from many different causes. Your caregiver has diagnosed you as having chest pain that is not specific for one problem, but does not require admission.  You are at low risk for an acute heart condition or other serious illness.   Your vital signs today were: BP (!) 202/114   Pulse 99   Temp 98.5 F (36.9 C)   Resp (!) 23   Ht 6' (1.829 m)   Wt 108.9  kg   SpO2 95%   BMI 32.55 kg/m  If your blood pressure (BP) was elevated above 135/85 this visit, please have this repeated by your doctor within one month. --------------

## 2023-05-08 NOTE — ED Provider Notes (Signed)
 [facility] Provider Note   CSN: [id number] Arrival date & time: 05/08/23  2022     History  Chief Complaint  Patient presents with   Cough   Shortness of Breath    Jeffery Glenn is a 53 y.o. male.  Patient with medical history significant for chronic systolic/diastolic heart failure with most recent LVEF 20 to 25%, status post AICD in September 2022, HTN, admission for influenza 2/6 - 04/18/2023.  Patient reports generalized improvement after admission.  He had a lingering cough that resolved for a few days but over the past week he has had increasing cough.  He has had some pain in his chest and in his back when he coughs.  He was concerned that he may be developing a pneumonia, prompting visit today.  He also noted swelling in his legs last night for which he took Lasix with improvement.  Reports good urinary output with this.  He has not been checking his weight recently.  He does report some orthopnea when he sleeps.  Last night he took DayQuil and slept well, however previous nights coughing has kept him awake.  No fevers.  No abdominal pain or vomiting.  He has had some diarrhea.       Home Medications Prior to Admission medications   Medication Sig Start Date End Date Taking? Authorizing Provider  atorvastatin (LIPITOR) 20 MG tablet Take 1 tablet (20 mg total) by mouth daily. 03/05/20   Georgeanna Lea, MD  carvedilol (COREG) 25 MG tablet Take 1.5 tablets (37.5 mg total) by mouth 2 (two) times daily with a meal. 09/16/22   Yates Decamp, MD  cyclobenzaprine (FLEXERIL) 10 MG tablet Take 10 mg by mouth at bedtime. 04/20/23   [provider]  dapagliflozin propanediol (FARXIGA) 10 MG TABS tablet Take 1 tablet (10 mg total) by mouth daily before breakfast. 04/16/22   Georgeanna Lea, MD  furosemide (LASIX) 40 MG tablet Take 1 tablet (40 mg total) by mouth daily as needed for fluid or edema (Weight gain >3 Lbs in 3 days). 09/16/22    Yates Decamp, MD  meloxicam (MOBIC) 7.5 MG tablet Take 7.5 mg by mouth daily. 04/20/23   [provider]  pantoprazole (PROTONIX) 40 MG tablet Take 1 tablet (40 mg total) by mouth daily. 04/18/23   Elgergawy, Leana Roe, MD  sacubitril-valsartan (ENTRESTO) 97-103 MG Take 1 tablet by mouth 2 (two) times daily. 10/13/21   Dunn, Tacey Ruiz, PA-C  spironolactone (ALDACTONE) 25 MG tablet Take one (1) tablet by mouth ( 25 mg) daily. 03/02/23   Tereso Newcomer T, PA-C      Allergies    Shellfish allergy, Hydrocodone, Ibuprofen, and Peanut-containing drug products    Review of Systems   Review of Systems  Physical Exam Updated Vital Signs BP (!) 188/102 (BP Location: Right Arm)   Pulse 88   Temp 98.5 F (36.9 C)   Resp (!) 24   Ht 6' (1.829 m)   Wt 108.9 kg   SpO2 97%   BMI 32.55 kg/m  Physical Exam Vitals and nursing note reviewed.  Constitutional:      Appearance: He is well-developed. He is not diaphoretic.  HENT:     Head: Normocephalic and atraumatic.     Mouth/Throat:     Mouth: Mucous membranes are not dry.  Eyes:     Conjunctiva/sclera: Conjunctivae normal.  Neck:     Vascular: Normal carotid pulses. No carotid bruit or JVD.  Trachea: Trachea normal. No tracheal deviation.  Cardiovascular:     Rate and Rhythm: Normal rate and regular rhythm.     Pulses: No decreased pulses.          Radial pulses are 2+ on the right side and 2+ on the left side.     Heart sounds: Normal heart sounds, S1 normal and S2 normal. Heart sounds not distant. No murmur heard. Pulmonary:     Effort: Pulmonary effort is normal. No respiratory distress.     Breath sounds: Decreased breath sounds present. No wheezing, rhonchi or rales.     Comments: Slightly decreased breath sounds globally, no rales Chest:     Chest wall: No tenderness.  Abdominal:     General: Bowel sounds are normal.     Palpations: Abdomen is soft.     Tenderness: There is no abdominal tenderness. There is no guarding or  rebound.  Musculoskeletal:     Cervical back: Normal range of motion and neck supple. No muscular tenderness.     Right lower leg: Edema present.     Left lower leg: Edema present.     Comments: Minimal to 1+ edema of the ankles bilaterally, symmetric.  Skin:    General: Skin is warm and dry.     Coloration: Skin is not pale.  Neurological:     Mental Status: He is alert. Mental status is at baseline.  Psychiatric:        Mood and Affect: Mood normal.     ED Results / Procedures / Treatments   Labs (all labs ordered are listed, but only abnormal results are displayed) Labs Reviewed  RESP PANEL BY RT-PCR (RSV, FLU A&B, COVID)  RVPGX2 - Abnormal; Notable for the following components:      Result Value   Resp Syncytial Virus by PCR POSITIVE (*)    All other components within normal limits  CBC WITH DIFFERENTIAL/PLATELET - Abnormal; Notable for the following components:   RDW 15.8 (*)    All other components within normal limits  BRAIN NATRIURETIC PEPTIDE - Abnormal; Notable for the following components:   B Natriuretic Peptide 714.8 (*)    All other components within normal limits  COMPREHENSIVE METABOLIC PANEL - Abnormal; Notable for the following components:   Potassium 3.4 (*)    Alkaline Phosphatase 35 (*)    All other components within normal limits  TROPONIN I (HIGH SENSITIVITY) - Abnormal; Notable for the following components:   Troponin I (High Sensitivity) 37 (*)    All other components within normal limits    EKG EKG Interpretation Date/Time:  Saturday May 08 2023 20:33:29 EST Ventricular Rate:  89 PR Interval:  191 QRS Duration:  101 QT Interval:  385 QTC Calculation: 469 R Axis:   263  Text Interpretation: Sinus rhythm Biatrial enlargement Left anterior fascicular block Abnormal R-wave progression, late transition Consider left ventricular hypertrophy Abnormal T, consider ischemia, lateral leads similar to prior twi noted on prior no stemi Confirmed by  Tanda Rockers ([id number]) on 05/08/2023 10:49:13 PM  Radiology DG Chest 2 View Result Date: 05/08/2023 CLINICAL DATA:  Cough and shortness of breath EXAM: CHEST - 2 VIEW COMPARISON:  04/15/2023 FINDINGS: Stable cardiomegaly. Left chest ICD. No focal consolidation, pleural effusion, or pneumothorax. No displaced rib fractures. IMPRESSION: No active cardiopulmonary disease. Electronically Signed   By: Minerva Fester M.D.   On: 05/08/2023 22:18    Procedures Procedures    Medications Ordered in ED Medications  benzonatate (TESSALON) capsule  100 mg (has no administration in time range)    ED Course/ Medical Decision Making/ A&P    Patient seen and examined. History obtained directly from patient.   Labs/EKG: Ordered CBC, CMP, BNP, troponin, viral panel.  EKG.  Imaging: Ordered chest x-ray.  Medications/Fluids: None ordered  Most recent vital signs reviewed and are as follows: BP (!) 188/102 (BP Location: Right Arm)   Pulse 88   Temp 98.5 F (36.9 C)   Resp (!) 24   Ht 6' (1.829 m)   Wt 108.9 kg   SpO2 97%   BMI 32.55 kg/m   Initial impression: Shortness of breath.  Patient looks comfortable breathing and is not hypoxic.  No increased work of breathing.  He does have very mild lower extremity swelling currently.  10:48 PM Reassessment performed. Patient appears stable.  No respiratory distress or increased work of breathing.  Labs personally reviewed and interpreted including: CBC unremarkable; CMP slightly low potassium at 3.4, normal kidney function; troponin minimally elevated at 37 which is in line with previous values in setting of heart failure; BNP 714 which is lower than typical baseline based on previous; respiratory panel positive for RSV.  EKG reviewed with Dr. Wallace Cullens.  Imaging personally visualized and interpreted including: Chest x-ray, agree negative for pneumonia.  Reviewed pertinent lab work and imaging with patient at bedside. Questions answered.   Most current  vital signs reviewed and are as follows: BP (!) 202/114   Pulse 99   Temp 98.5 F (36.9 C)   Resp (!) 23   Ht 6' (1.829 m)   Wt 108.9 kg   SpO2 95%   BMI 32.55 kg/m   Plan: Discharge to home.  Patient's blood pressure is becoming more elevated.  Patient did not take his blood pressure medication today.  He will take once he gets home tonight.  No hypoxia.  Prescriptions written for: None  Other home care instructions discussed: Rest, reasonable hydration, continue to take home medications.  Recommended that he checks his weight routinely to evaluate need for diuretic.  ED return instructions discussed: New or worsening symptoms, worsening shortness of breath, increased work of breathing  Follow-up instructions discussed: Patient encouraged to follow-up with their PCP in 3 days.                                 Medical Decision Making Amount and/or Complexity of Data Reviewed Labs: ordered. Radiology: ordered.   Patient presents with worsening cough and some shortness of breath in setting congestive heart failure.  Recently got over the flu.  He was concerned about pneumonia.  Fortunately no pneumonia tonight on chest x-ray or signs of significant fluid overload on lab testing and clinical exam.  He does have RSV which is likely the underlying cause of his respiratory symptoms.  He has not been hypoxic.  He does not have increased work of breathing.  Overall he looks generally well.  He is comfortable with discharge to home at this time.  No indication for antibiotics.  He will continue home blood pressure medications.  Low concern for ACS, PE.  Troponin and BNP in line with previous.  EKG with abnormalities, but similar to previous.        Final Clinical Impression(s) / ED Diagnoses Final diagnoses:  RSV infection  Viral URI with cough  Hypertension, unspecified type    Rx / DC Orders ED Discharge Orders  Ordered    benzonatate (TESSALON) 100 MG capsule  Every  8 hours        05/08/23 2300              Renne Crigler, PA-C 05/08/23 2304    Sloan Leiter, DO 05/09/23 2325

## 2023-05-08 NOTE — ED Notes (Signed)
 Patient was advised by RN to document his b/p readings at home and bring the list to his follow up with his PMD for review.

## 2023-05-11 ENCOUNTER — Ambulatory Visit: Payer: Medicaid Other | Admitting: Internal Medicine

## 2023-05-12 ENCOUNTER — Ambulatory Visit: Payer: Medicaid Other | Admitting: Cardiology

## 2023-05-24 ENCOUNTER — Ambulatory Visit (INDEPENDENT_AMBULATORY_CARE_PROVIDER_SITE_OTHER): Payer: 59

## 2023-05-24 DIAGNOSIS — I5020 Unspecified systolic (congestive) heart failure: Secondary | ICD-10-CM

## 2023-05-24 DIAGNOSIS — I420 Dilated cardiomyopathy: Secondary | ICD-10-CM | POA: Diagnosis not present

## 2023-05-26 LAB — CUP PACEART REMOTE DEVICE CHECK
Battery Remaining Percentage: 72 %
Date Time Interrogation Session: 20250317154800
HighPow Impedance: 60 Ohm
Implantable Lead Connection Status: 753985
Implantable Lead Implant Date: 20220902
Implantable Lead Location: 753860
Implantable Lead Model: 3501
Implantable Lead Serial Number: 225042
Implantable Pulse Generator Implant Date: 20220902
Pulse Gen Serial Number: 166628

## 2023-05-28 ENCOUNTER — Ambulatory Visit: Payer: Medicaid Other | Admitting: Cardiology

## 2023-06-13 IMAGING — DX DG CHEST 1V PORT
1 series · 1 of 1 positions shown · non-contrast
Comparison: Chest radiograph 11/30/2020

CLINICAL DATA: Chest tightness, shortness of breath

EXAM:
PORTABLE CHEST 1 VIEW

[chest ap]
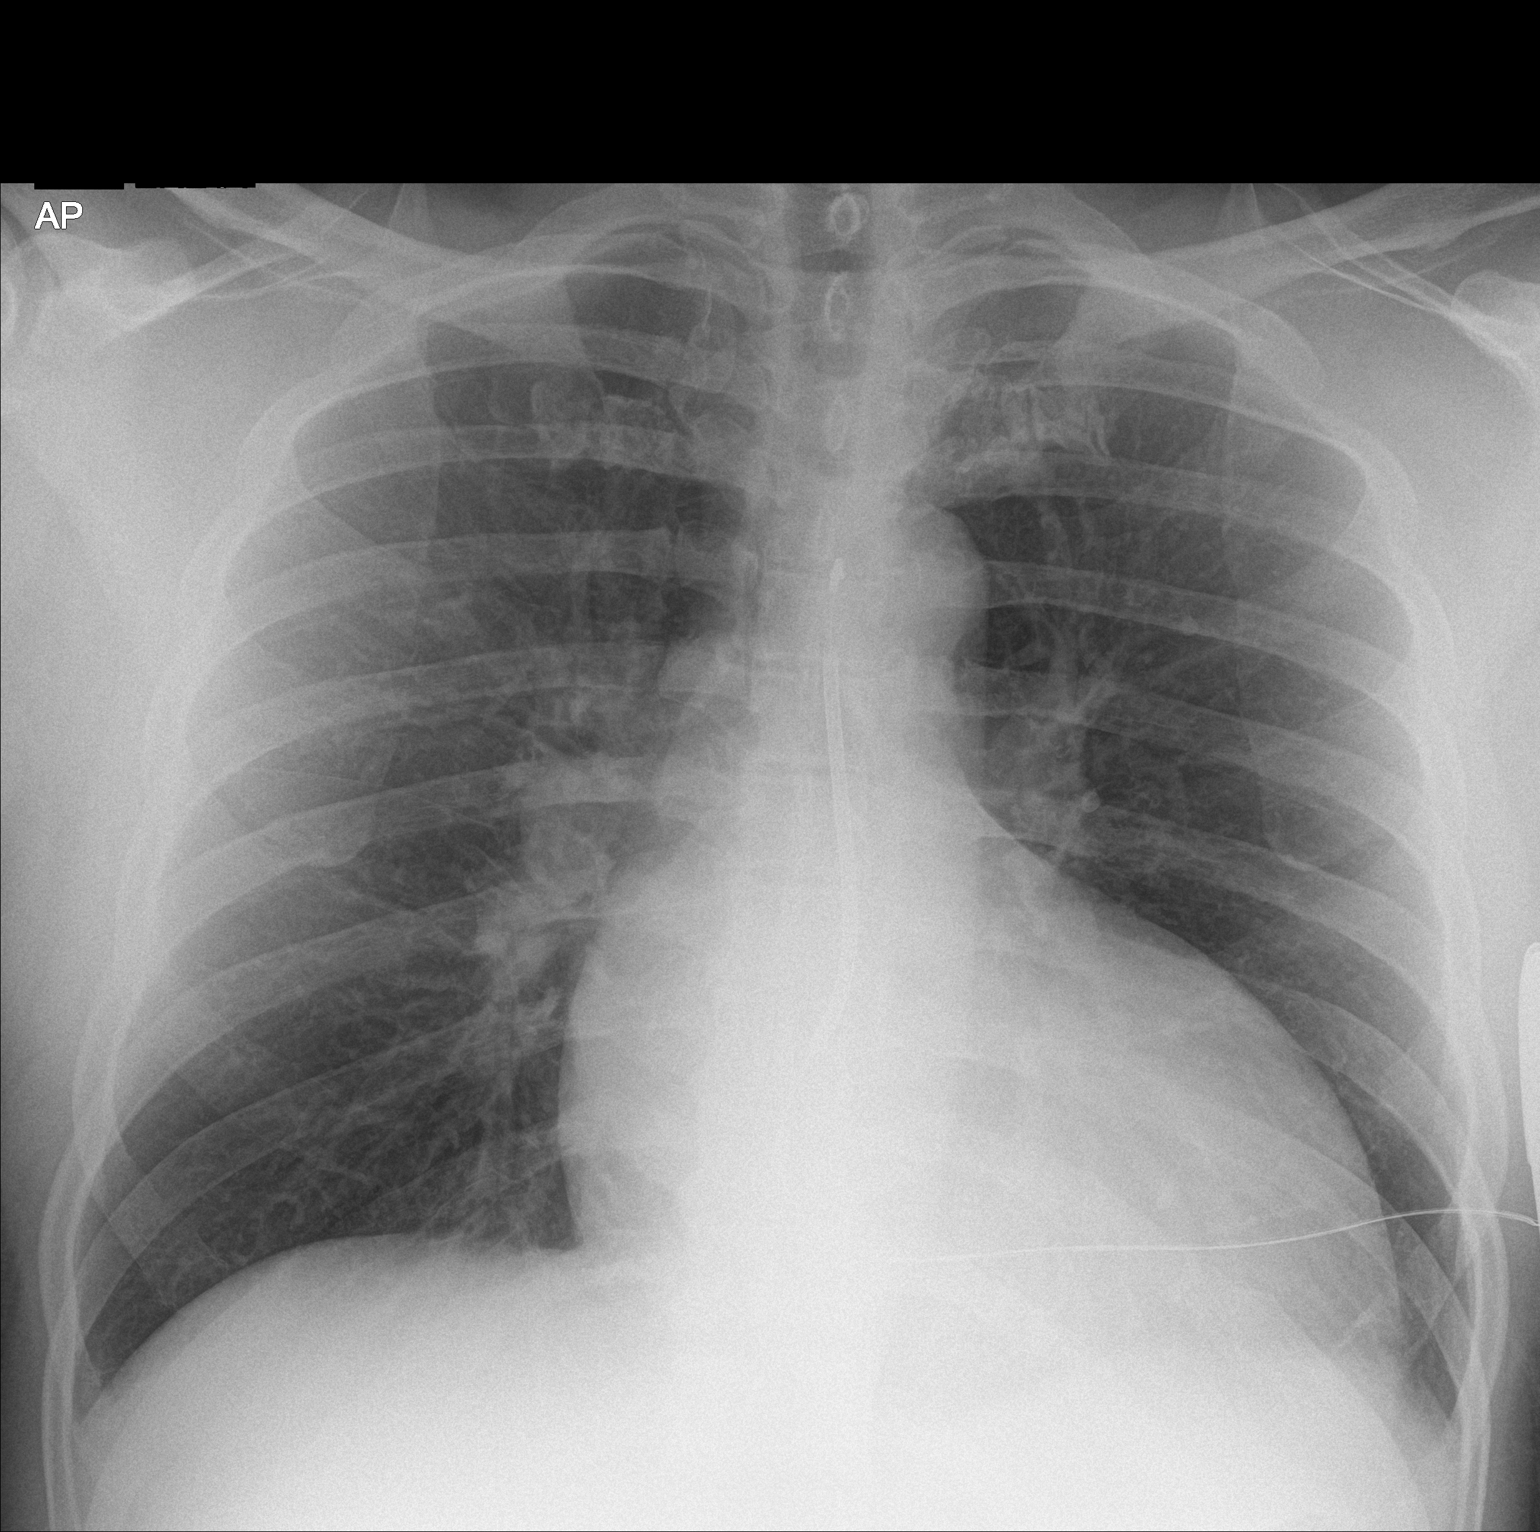

[1 of 1 positions shown; findings below may reference images not displayed]

FINDINGS: A left chest wall cardiac device with a single anterior lead
projecting over the midline is unchanged.

The heart is enlarged, unchanged. The mediastinal contours are
within normal limits.

There is no focal consolidation or pulmonary edema. There is no
pleural effusion or pneumothorax.

The bones are normal.
IMPRESSION: Unchanged cardiomegaly with no radiographic evidence of acute
cardiopulmonary process.

## 2023-06-29 ENCOUNTER — Ambulatory Visit: Admitting: Gastroenterology

## 2023-06-30 NOTE — Telephone Encounter (Signed)
Closing encounter.  Nothing further needed.

## 2023-07-08 ENCOUNTER — Encounter: Payer: Self-pay | Admitting: Cardiology

## 2023-07-08 ENCOUNTER — Other Ambulatory Visit (HOSPITAL_COMMUNITY): Payer: Self-pay

## 2023-07-08 ENCOUNTER — Ambulatory Visit: Attending: Cardiology | Admitting: Cardiology

## 2023-07-08 VITALS — BP 128/94 | HR 65 | Ht 72.0 in | Wt 239.0 lb

## 2023-07-08 DIAGNOSIS — I420 Dilated cardiomyopathy: Secondary | ICD-10-CM | POA: Diagnosis present

## 2023-07-08 DIAGNOSIS — G4733 Obstructive sleep apnea (adult) (pediatric): Secondary | ICD-10-CM | POA: Insufficient documentation

## 2023-07-08 DIAGNOSIS — I1A0 Resistant hypertension: Secondary | ICD-10-CM | POA: Insufficient documentation

## 2023-07-08 DIAGNOSIS — I428 Other cardiomyopathies: Secondary | ICD-10-CM | POA: Insufficient documentation

## 2023-07-08 DIAGNOSIS — I5020 Unspecified systolic (congestive) heart failure: Secondary | ICD-10-CM | POA: Insufficient documentation

## 2023-07-08 MED ORDER — CARVEDILOL 25 MG PO TABS
37.5000 mg | ORAL_TABLET | Freq: Two times a day (BID) | ORAL | 2 refills | Status: AC
  Filled 2023-07-08: qty 270, 90d supply, fill #0
  Filled 2024-01-18: qty 270, 90d supply, fill #1

## 2023-07-08 MED ORDER — ENTRESTO 97-103 MG PO TABS
1.0000 | ORAL_TABLET | Freq: Two times a day (BID) | ORAL | 2 refills | Status: AC
  Filled 2023-07-08: qty 180, 90d supply, fill #0
  Filled 2024-01-18: qty 180, 90d supply, fill #1

## 2023-07-08 MED ORDER — FUROSEMIDE 40 MG PO TABS
40.0000 mg | ORAL_TABLET | Freq: Every day | ORAL | 6 refills | Status: AC | PRN
  Filled 2023-07-08 – 2024-01-18 (×2): qty 30, 30d supply, fill #0

## 2023-07-08 MED ORDER — HYDRALAZINE HCL 50 MG PO TABS
50.0000 mg | ORAL_TABLET | Freq: Three times a day (TID) | ORAL | 3 refills | Status: AC
  Filled 2023-07-08: qty 270, 90d supply, fill #0
  Filled 2024-01-18: qty 270, 90d supply, fill #1

## 2023-07-08 NOTE — Progress Notes (Signed)
 Remote ICD transmission.

## 2023-07-08 NOTE — Progress Notes (Signed)
 Cardiology Office Note:  .   Date:  07/08/2023  ID:  Jeffery Glenn, DOB 26-May-1970, MRN [id number] PCP: Dianah Fort, PA  [facility] Providers Cardiologist:  Knox Perl, MD   History of Present Illness: .   Jeffery Glenn is a 53 y.o. African American male, with a significant PMH of nonischemic cardiomyopathy severely reduced left ventricle ejection fraction, essential hypertension, severe obstructive sleep apnea (04/2022) has started CPAP therapy and trying to get used to it, SQ ICD present (11/08/2020). Coronary CTA 06/15/2019 revealed coronary calcium  score of 0, no coronary artery disease and marked LVH.  His last echocardiogram in July 24 revealed moderately dilated LV with grade 3 diastolic dysfunction and EF 20 to 25%.  Last admission to the hospital 05/08/2023 with RSV infection.   Discussed the use of AI scribe software for clinical note transcription with the patient, who gave verbal consent to proceed.  History of Present Illness Jeffery Glenn is a 53 year old male with decreased heart function who presents for a routine follow-up visit. He is accompanied by his wife.  He seeks medication refills for carvedilol , Lasix , and Entresto . His weight remains stable at 239 pounds, and he is reducing salt intake to aid weight loss. He has an ejection fraction of 35-40% and takes carvedilol  37.5 mg twice daily, Farxiga  10 mg daily, Entresto  97/103 mg twice daily, spironolactone  25 mg daily, and Lasix  40 mg as needed. He discontinued isosorbide  dinitrate after hypotension requiring hospitalization.  He has sleep apnea and plans to resume CPAP use, though he has not found a suitable mask. He does not smoke and eats pork weekly. He experiences no chest pain, shortness of breath, or palpitations. Blood pressure is elevated.  Labs   Lab Results  Component Value Date   CHOL 184 01/31/2008   HDL 52 01/31/2008   LDLCALC 120 (H) 01/31/2008   TRIG 62 01/31/2008   CHOLHDL 3.5 Ratio 01/31/2008    Lab Results  Component Value Date   NA 140 05/08/2023   K 3.4 (L) 05/08/2023   CO2 26 05/08/2023   GLUCOSE 97 05/08/2023   BUN 15 05/08/2023   CREATININE 1.08 05/08/2023   CALCIUM  9.0 05/08/2023   EGFR 90 03/02/2023   GFRNONAA >60 05/08/2023      Latest Ref Rng & Units 05/08/2023    8:59 PM 04/17/2023    4:54 AM 04/16/2023    6:03 AM  BMP  Glucose 70 - 99 mg/dL 97  403  474   BUN 6 - 20 mg/dL 15  22  17    Creatinine 0.61 - 1.24 mg/dL 2.59  5.63  8.75   Sodium 135 - 145 mmol/L 140  136  137   Potassium 3.5 - 5.1 mmol/L 3.4  4.2  3.9   Chloride 98 - 111 mmol/L 104  101  101   CO2 22 - 32 mmol/L 26  22  23    Calcium  8.9 - 10.3 mg/dL 9.0  8.9  8.5       Latest Ref Rng & Units 05/08/2023    8:59 PM 04/17/2023    4:54 AM 04/16/2023    6:03 AM  CBC  WBC 4.0 - 10.5 K/uL 4.7  2.8  4.8   Hemoglobin 13.0 - 17.0 g/dL 64.3  32.9  51.8   Hematocrit 39.0 - 52.0 % 41.3  44.4  42.1   Platelets 150 - 400 K/uL 174  146  138    No results found for: "HGBA1C"  Lab  Results  Component Value Date   TSH 0.932 12/29/2007    ROS  Review of Systems  Cardiovascular:  Negative for chest pain, dyspnea on exertion and leg swelling.    Physical Exam:   VS:  BP (!) 128/94 (BP Location: Left Arm, Patient Position: Sitting)   Pulse 65   Ht 6' (1.829 m)   Wt 239 lb (108.4 kg)   SpO2 99%   BMI 32.41 kg/m    Wt Readings from Last 3 Encounters:  07/08/23 239 lb (108.4 kg)  05/08/23 240 lb (108.9 kg)  04/17/23 235 lb 14.3 oz (107 kg)    Physical Exam Constitutional:      Appearance: He is obese.  Neck:     Vascular: No carotid bruit or JVD.  Cardiovascular:     Rate and Rhythm: Normal rate and regular rhythm.     Pulses: Intact distal pulses.     Heart sounds: Normal heart sounds. No murmur heard.    No gallop.  Pulmonary:     Effort: Pulmonary effort is normal.     Breath sounds: Normal breath sounds.  Abdominal:     General: Bowel sounds are normal.     Palpations: Abdomen is soft.   Musculoskeletal:     Right lower leg: No edema.     Left lower leg: No edema.    Studies Reviewed: .    Echocardiogram 09/14/2022: Left ventricle cavity is moderately dilated. Dilated cardiomyopathy. Moderate concentric hypertrophy of the left ventricle. Hypokinetic global wall motion. Doppler evidence of grade III (restrictive) diastolic dysfunction. Severely depressed LV systolic function with visual EF 20-25%. Calculated EF 28%. Left atrial cavity is severely dilated at 50.7 ml/m^2. Right ventricle cavity is normal in size. Mildly reduced right ventricular function. Trileaflet aortic valve. Trace aortic regurgitation. Structurally normal mitral valve. Moderate (Grade II) mitral regurgitation. Structurally normal tricuspid valve. Mild tricuspid regurgitation. No evidence of pulmonary hypertension.  DG Chest 2 View 05/08/2023 CHEST - 2 VIEW COMPARISON:  04/15/2023 FINDINGS: Stable cardiomegaly. Left chest ICD. No focal consolidation, pleural effusion, or pneumothorax. No displaced rib fractures. IMPRESSION: No active cardiopulmonary disease.  EKG:         Medications and allergies    Allergies  Allergen Reactions   Shellfish Allergy Other (See Comments)    hives   Hydrocodone     Ibuprofen     Peanut-Containing Drug Products Other (See Comments)    Allergy test/ Patient eats peanuts     Current Outpatient Medications:    atorvastatin  (LIPITOR) 20 MG tablet, Take 1 tablet (20 mg total) by mouth daily., Disp: 90 tablet, Rfl: 3   cyclobenzaprine  (FLEXERIL ) 10 MG tablet, Take 10 mg by mouth at bedtime., Disp: , Rfl:    dapagliflozin  propanediol (FARXIGA ) 10 MG TABS tablet, Take 1 tablet (10 mg total) by mouth daily before breakfast., Disp: 90 tablet, Rfl: 3   fluticasone (FLONASE) 50 MCG/ACT nasal spray, Place 1 spray into both nostrils daily., Disp: , Rfl:    hydrALAZINE  (APRESOLINE ) 50 MG tablet, Take 1 tablet (50 mg total) by mouth 3 (three) times daily., Disp: 270 tablet, Rfl:  3   levocetirizine (XYZAL) 5 MG tablet, 1 tab(s) orally once a day (in the evening) for 30 day(s), Disp: , Rfl:    meloxicam  (MOBIC ) 7.5 MG tablet, Take 7.5 mg by mouth daily., Disp: , Rfl:    pantoprazole  (PROTONIX ) 40 MG tablet, Take 1 tablet (40 mg total) by mouth daily., Disp: 90 tablet, Rfl: 0   spironolactone  (ALDACTONE )  25 MG tablet, Take one (1) tablet by mouth ( 25 mg) daily., Disp: , Rfl:    VENTOLIN HFA 108 (90 Base) MCG/ACT inhaler, 2 puff(s) inhaled 2 x daily as needed, Disp: , Rfl:    carvedilol  (COREG ) 25 MG tablet, Take 1.5 tablets (37.5 mg total) by mouth 2 (two) times daily with a meal., Disp: 270 tablet, Rfl: 2   furosemide  (LASIX ) 40 MG tablet, Take 1 tablet (40 mg total) by mouth daily as needed for fluid or edema (Weight gain >3 Lbs in 3 days)., Disp: 30 tablet, Rfl: 6   sacubitril -valsartan  (ENTRESTO ) 97-103 MG, Take 1 tablet by mouth 2 (two) times daily., Disp: 180 tablet, Rfl: 2   Meds ordered this encounter  Medications   hydrALAZINE  (APRESOLINE ) 50 MG tablet    Sig: Take 1 tablet (50 mg total) by mouth 3 (three) times daily.    Dispense:  270 tablet    Refill:  3   furosemide  (LASIX ) 40 MG tablet    Sig: Take 1 tablet (40 mg total) by mouth daily as needed for fluid or edema (Weight gain >3 Lbs in 3 days).    Dispense:  30 tablet    Refill:  6   carvedilol  (COREG ) 25 MG tablet    Sig: Take 1.5 tablets (37.5 mg total) by mouth 2 (two) times daily with a meal.    Dispense:  270 tablet    Refill:  2   sacubitril -valsartan  (ENTRESTO ) 97-103 MG    Sig: Take 1 tablet by mouth 2 (two) times daily.    Dispense:  180 tablet    Refill:  2     Medications Discontinued During This Encounter  Medication Reason   benzonatate  (TESSALON ) 100 MG capsule Patient Preference   sacubitril -valsartan  (ENTRESTO ) 97-103 MG Reorder   carvedilol  (COREG ) 25 MG tablet Reorder   furosemide  (LASIX ) 40 MG tablet Reorder     ASSESSMENT AND PLAN: .      ICD-10-CM   1. HFrEF (heart  failure with reduced ejection fraction) (HCC)  I50.20 ECHOCARDIOGRAM COMPLETE    furosemide  (LASIX ) 40 MG tablet    carvedilol  (COREG ) 25 MG tablet    sacubitril -valsartan  (ENTRESTO ) 97-103 MG    2. Nonischemic cardiomyopathy (HCC)  I42.8 ECHOCARDIOGRAM COMPLETE    3. 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 OSA (obstructive sleep apnea)  G47.33     5. Dilated cardiomyopathy (HCC)  I42.0 carvedilol  (COREG ) 25 MG tablet      Assessment and Plan Assessment & Plan Heart failure with reduced ejection fraction   Heart failure with an ejection fraction of 35-40% is managed with carvedilol , Farxiga , Entresto , spironolactone , and Lasix . Emphasis is placed on dietary changes and CPAP use to improve his condition. He should reduce salt intake and limit pork consumption to once a month. Continue carvedilol  37.5 mg twice daily, Farxiga  10 mg once daily, Entresto  97/103 mg twice daily, spironolactone  25 mg once daily, and Lasix  40 mg as needed. Encourage dietary changes and consistent CPAP use.  As he is on GDP, we will repeat echocardiogram to reassess his LVEF.  Office visit in 3 months.  Hypertension   Hypertension management is crucial due to a previous hypotensive episode leading to hospitalization. CPAP use is expected to help normalize blood pressure. Restart hydralazine  50 mg three times daily and encourage consistent CPAP use.  Previously on combination of isosorbide  dinitrate and hydralazine , he had experienced hypotension.  If blood pressure still elevated on  his next office visit we will consider addition of isosorbide  dinitrate.  Obstructive sleep apnea   Obstructive sleep apnea with non-compliance to CPAP therapy requires ordering necessary equipment to resume use. Emphasize proper mask fitting and consistent use to improve blood pressure and overall health. He should try each mask for 2-3 weeks to find the best fit. Order necessary CPAP equipment and  encourage consistent use with proper mask fitting.  Signed,  Knox Perl, MD, [facility] 07/08/2023, 10:48 PM [facility] [address] [place], [place] [address] Phone: [phone]. Fax:  [phone]

## 2023-07-08 NOTE — Patient Instructions (Signed)
 Medication Instructions:  Your physician has recommended you make the following change in your medication:  Start hydralazine  50 mg by mouth three times daily   *If you need a refill on your cardiac medications before your next appointment, please call your pharmacy*  Lab Work: none If you have labs (blood work) drawn today and your tests are completely normal, you will receive your results only by: MyChart Message (if you have MyChart) OR A paper copy in the mail If you have any lab test that is abnormal or we need to change your treatment, we will call you to review the results.  Testing/Procedures: Your physician has requested that you have an echocardiogram. Echocardiography is a painless test that uses sound waves to create images of your heart. It provides your doctor with information about the size and shape of your heart and how well your heart's chambers and valves are working. This procedure takes approximately one hour. There are no restrictions for this procedure. Please do NOT wear cologne, perfume, aftershave, or lotions (deodorant is allowed). Please arrive 15 minutes prior to your appointment time.  Please note: We ask at that you not bring children with you during ultrasound (echo/ vascular) testing. Due to room size and safety concerns, children are not allowed in the ultrasound rooms during exams. Our front office staff cannot provide observation of children in our lobby area while testing is being conducted. An adult accompanying a patient to their appointment will only be allowed in the ultrasound room at the discretion of the ultrasound technician under special circumstances. We apologize for any inconvenience.   Follow-Up: At [facility], you and your health needs are our priority.  As part of our continuing mission to provide you with exceptional heart care, our providers are all part of one team.  This team includes your primary Cardiologist (physician) and  Advanced Practice Providers or APPs (Physician Assistants and Nurse Practitioners) who all work together to provide you with the care you need, when you need it.  Your next appointment:   3 month(s)  Provider:   Knox Perl, MD    We recommend signing up for the patient portal called "MyChart".  Sign up information is provided on this After Visit Summary.  MyChart is used to connect with patients for Virtual Visits (Telemedicine).  Patients are able to view lab/test results, encounter notes, upcoming appointments, etc.  Non-urgent messages can be sent to your provider as well.   To learn more about what you can do with MyChart, go to [email or web address].   Other Instructions

## 2023-07-08 NOTE — Addendum Note (Signed)
 Addended by: Edra Govern D on: 07/08/2023 03:39 PM   Modules accepted: Orders

## 2023-08-13 ENCOUNTER — Ambulatory Visit (HOSPITAL_COMMUNITY)

## 2023-08-23 ENCOUNTER — Ambulatory Visit (INDEPENDENT_AMBULATORY_CARE_PROVIDER_SITE_OTHER): Payer: 59

## 2023-08-23 DIAGNOSIS — I5020 Unspecified systolic (congestive) heart failure: Secondary | ICD-10-CM

## 2023-08-23 DIAGNOSIS — I428 Other cardiomyopathies: Secondary | ICD-10-CM

## 2023-08-24 LAB — CUP PACEART REMOTE DEVICE CHECK
Battery Remaining Percentage: 69 %
Date Time Interrogation Session: 20250617080900
HighPow Impedance: 60 Ohm
Implantable Lead Connection Status: 753985
Implantable Lead Implant Date: 20220902
Implantable Lead Location: 753860
Implantable Lead Model: 3501
Implantable Lead Serial Number: 225042
Implantable Pulse Generator Implant Date: 20220902
Pulse Gen Serial Number: 166628

## 2023-08-26 ENCOUNTER — Ambulatory Visit: Payer: Self-pay | Admitting: Cardiology

## 2023-09-28 ENCOUNTER — Ambulatory Visit (HOSPITAL_COMMUNITY)

## 2023-09-30 NOTE — Addendum Note (Signed)
 Addended by: TAWNI DRILLING D on: 09/30/2023 12:44 PM   Modules accepted: Orders

## 2023-09-30 NOTE — Progress Notes (Signed)
 Remote ICD transmission.

## 2023-10-08 ENCOUNTER — Ambulatory Visit: Admitting: Cardiology

## 2023-10-08 ENCOUNTER — Encounter: Payer: Self-pay | Admitting: Cardiology

## 2023-10-12 NOTE — Progress Notes (Signed)
 Cancelled appointment

## 2023-11-02 ENCOUNTER — Ambulatory Visit (HOSPITAL_COMMUNITY)
Admission: RE | Admit: 2023-11-02 | Discharge: 2023-11-02 | Disposition: A | Discharge status: Home / Self Care | Source: Ambulatory Visit | Attending: Cardiovascular Disease | Admitting: Cardiovascular Disease

## 2023-11-02 DIAGNOSIS — I428 Other cardiomyopathies: Secondary | ICD-10-CM | POA: Diagnosis present

## 2023-11-02 DIAGNOSIS — I5020 Unspecified systolic (congestive) heart failure: Secondary | ICD-10-CM | POA: Diagnosis present

## 2023-11-02 LAB — ECHOCARDIOGRAM COMPLETE
Area-P 1/2: 4.94 cm2
P 1/2 time: 567 ms
S' Lateral: 5.55 cm

## 2023-11-03 ENCOUNTER — Ambulatory Visit: Payer: Self-pay | Admitting: Cardiology

## 2023-11-09 ENCOUNTER — Ambulatory Visit: Attending: Cardiology | Admitting: Cardiology

## 2023-11-09 ENCOUNTER — Encounter: Payer: Self-pay | Admitting: Cardiology

## 2023-11-09 VITALS — BP 135/89 | HR 66 | Resp 16 | Ht 72.0 in | Wt 246.6 lb

## 2023-11-09 DIAGNOSIS — G4733 Obstructive sleep apnea (adult) (pediatric): Secondary | ICD-10-CM | POA: Diagnosis present

## 2023-11-09 DIAGNOSIS — I1A0 Resistant hypertension: Secondary | ICD-10-CM | POA: Diagnosis not present

## 2023-11-09 DIAGNOSIS — E7800 Pure hypercholesterolemia, unspecified: Secondary | ICD-10-CM | POA: Insufficient documentation

## 2023-11-09 DIAGNOSIS — I5020 Unspecified systolic (congestive) heart failure: Secondary | ICD-10-CM | POA: Diagnosis present

## 2023-11-09 DIAGNOSIS — I428 Other cardiomyopathies: Secondary | ICD-10-CM | POA: Insufficient documentation

## 2023-11-09 NOTE — Patient Instructions (Signed)
 Medication Instructions:  No medication changes were made at this visit. Continue current regimen.   *If you need a refill on your cardiac medications before your next appointment, please call your pharmacy*  Lab Work: NONE If you have labs (blood work) drawn today and your tests are completely normal, you will receive your results only by: MyChart Message (if you have MyChart) OR A paper copy in the mail If you have any lab test that is abnormal or we need to change your treatment, we will call you to review the results.  Testing/Procedures: NONE  Follow-Up: At [facility], you and your health needs are our priority.  As part of our continuing mission to provide you with exceptional heart care, our providers are all part of one team.  This team includes your primary Cardiologist (physician) and Advanced Practice Providers or APPs (Physician Assistants and Nurse Practitioners) who all work together to provide you with the care you need, when you need it.  Your next appointment:   6 months  Provider:   Gordy Bergamo, MD    We recommend signing up for the patient portal called MyChart.  Sign up information is provided on this After Visit Summary.  MyChart is used to connect with patients for Virtual Visits (Telemedicine).  Patients are able to view lab/test results, encounter notes, upcoming appointments, etc.  Non-urgent messages can be sent to your provider as well.   To learn more about what you can do with MyChart, go to [email or web address].

## 2023-11-09 NOTE — Progress Notes (Signed)
 Cardiology Office Note:  .   Date:  11/09/2023  ID:  Jeffery Glenn, DOB 1970-10-31, MRN [id number] PCP: Rosalea Rosina SAILOR, PA  [facility] Providers Cardiologist:  Gordy Bergamo, MD   History of Present Illness: .   Jeffery Glenn is a 53 y.o. African American male, with a significant PMH of nonischemic cardiomyopathy severely reduced left ventricle ejection fraction, essential hypertension, severe obstructive sleep apnea (04/2022) has started CPAP therapy and trying to get used to it, SQ ICD present (11/08/2020). Coronary CTA 06/15/2019 revealed coronary calcium  score of 0, no coronary artery disease and marked LVH.   On his last office visit 3 months ago on 07/08/2023 and reinitiated hydralazine  50 mg 3 times daily and recommended that he use his CPAP on a regular basis.  He now presents here for follow-up.  Underwent echocardiogram.  Cardiac Studies relevent.    ECHOCARDIOGRAM COMPLETE 11/02/2023 1. Left ventricular ejection fraction, by estimation, is 20 to 25%. The left ventricle has severely decreased function. The left ventricle demonstrates global hypokinesis. The left ventricular internal cavity size was severely dilated. Left ventricular diastolic parameters are consistent with Grade II diastolic dysfunction (pseudonormalization). 2. Right ventricular systolic function is low normal. The right ventricular size is normal. Tricuspid regurgitation signal is inadequate for assessing PA pressure. 3. Left atrial size was severely dilated. 4. Right atrial size was severely dilated. 5. The mitral valve is normal in structure. Mild to moderate mitral valve regurgitation. No evidence of mitral stenosis.   6. Normal CVP    Discussed the use of AI scribe software for clinical note transcription with the patient, who gave verbal consent to proceed.  History of Present Illness Jeffery Glenn is a 53 year old male with heart failure and hypertension who presents for cardiovascular follow-up. He was  referred by Rosina Fleeta Curtis, PA, for cardiovascular management.  Blood pressure has improved since starting hydralazine  50 mg three times a day. He has heart failure with an ejection fraction of 20-25% and is on carvedilol  25 mg one and a half tablets twice a day, Entresto  97/103 mg twice a day, and spironolactone  25 mg once a day. He is committed to adhering to his medication regimen. Atorvastatin  20 mg is taken once a day for cholesterol management, though adherence has been inconsistent over the past year. He does not smoke and consumes alcohol occasionally. He has experienced weight gain over the holidays.  Labs    Recent Labs    04/16/23 0603 04/17/23 0454 05/08/23 2059  NA 137 136 140  K 3.9 4.2 3.4*  CL 101 101 104  CO2 23 22 26   GLUCOSE 117* 189* 97  BUN 17 22* 15  CREATININE 1.12 1.11 1.08  CALCIUM  8.5* 8.9 9.0  GFRNONAA >60 >60 >60    Lab Results  Component Value Date   ALT 16 05/08/2023   AST 19 05/08/2023   ALKPHOS 35 (L) 05/08/2023   BILITOT 0.7 05/08/2023      Latest Ref Rng & Units 05/08/2023    8:59 PM 04/17/2023    4:54 AM 04/16/2023    6:03 AM  CBC  WBC 4.0 - 10.5 K/uL 4.7  2.8  4.8   Hemoglobin 13.0 - 17.0 g/dL 86.5  85.4  86.0   Hematocrit 39.0 - 52.0 % 41.3  44.4  42.1   Platelets 150 - 400 K/uL 174  146  138    No results found for: HGBA1C  Lab Results  Component Value Date   TSH  0.932 12/29/2007    Care everywhere/Faxed External Labs:  Labs 10/17/2023:  TSH normal at 0.91, total testosterone 271.  A1c 6.3%.  Hb 14.7/HCT 47.4, platelets 225, normal indicis.  BUN 13, creatinine 0.95, eGFR 97 mL, potassium 4.3, LFTs normal.  Lipid profile 02/21/2023:  Total cholesterol 198, triglycerides 125, HDL 46, LDL 128.  Non-HDL cholesterol 152.  ROS  Review of Systems  Cardiovascular:  Negative for chest pain, dyspnea on exertion and leg swelling.  Respiratory:  Positive for snoring.    Physical Exam:   VS:  BP 135/89 (BP Location: Left Arm,  Patient Position: Sitting, Cuff Size: Normal)   Pulse 66   Resp 16   Ht 6' (1.829 m)   Wt 246 lb 9.6 oz (111.9 kg)   SpO2 98%   BMI 33.44 kg/m    Wt Readings from Last 3 Encounters:  11/09/23 246 lb 9.6 oz (111.9 kg)  10/08/23 240 lb 6.4 oz (109 kg)  07/08/23 239 lb (108.4 kg)    BP Readings from Last 3 Encounters:  11/09/23 135/89  10/08/23 (!) 144/94  07/08/23 (!) 128/94   Physical Exam Constitutional:      Appearance: He is obese.  Neck:     Vascular: No carotid bruit or JVD.  Cardiovascular:     Rate and Rhythm: Normal rate and regular rhythm.     Pulses: Intact distal pulses.     Heart sounds: Normal heart sounds. No murmur heard.    No gallop.  Pulmonary:     Effort: Pulmonary effort is normal.     Breath sounds: Normal breath sounds.  Abdominal:     General: Bowel sounds are normal.     Palpations: Abdomen is soft.  Musculoskeletal:     Right lower leg: No edema.     Left lower leg: No edema.    EKG:         ASSESSMENT AND PLAN: .      ICD-10-CM   1. HFrEF (heart failure with reduced ejection fraction) (HCC)  I50.20     2. Nonischemic cardiomyopathy (HCC)  I42.8     3. Resistant hypertension  I1A.0     4. Mild hypercholesterolemia  E78.00     5. OSA on CPAP  G47.33      Assessment & Plan Severely reduced ejection fraction heart failure Heart function is severely depressed with an ejection fraction of 20-25%, yet performing adequately. Emphasis on controlling blood pressure and addressing sleep apnea to improve heart function. Importance of medication adherence and lifestyle modifications to prevent further cardiac events was discussed. - Continue carvedilol  37.5 mg twice a day - Continue hydralazine  50 mg three times a day - Continue Entresto  97/103 mg twice a day - Continue spironolactone  25 mg once a day - Use furosemide  as needed for leg swelling or weight gain of 3 pounds in 3 days  Resistant hypertension Blood pressure is well-controlled  with the current medication regimen, including hydralazine . Emphasis on adherence to medication and lifestyle modifications, including diet and CPAP use, to maintain control. - Continue current antihypertensive regimen - Emphasize adherence to medication schedule - Address diet and CPAP use as part of blood pressure management  Obstructive sleep apnea Difficulty using CPAP due to discomfort with the mask. Encouraged to persist with CPAP use, crucial for overall health and management of heart failure and hypertension. Discussed strategies to improve comfort, such as using a nasal mask with a strap and applying Vaseline to prevent irritation. He expressed interest  in trying a full mask for better comfort. - Encourage consistent use of CPAP every night - Try using a nasal mask with a strap - Apply Vaseline to prevent nasal irritation - Consider trying a full mask for better comfort  Hypercholesterolemia Cholesterol levels are high, likely due to inconsistent use of atorvastatin . Reinforced the importance of daily use of atorvastatin  to manage cholesterol levels and prevent further cardiac events. Discussed follow-up with primary care provider for cholesterol recheck. - Take atorvastatin  20 mg once daily - Follow up with primary care provider to recheck cholesterol in 3-6 months   Follow up: 6 months or sooner if problems.  Although blood pressure can be improved even further, I truly believe that compliance with CPAP and weight loss will certainly help with Blood pressure management and I do not want to overwhelm him with more medications.  Patient has also made up his mind to concentrate on his health.  He is very pleased that his blood pressure is finally well-controlled. During follow-up if his blood pressure is elevated, addition of isosorbide  dinitrate 30 mg 3 times daily will certainly help with both cardiomyopathy and improving his blood pressure as well.  Signed,  Gordy Bergamo, MD,  [facility] 11/09/2023, 12:34 PM [facility] [address] [place], [place] [address] Phone: [phone]. Fax:  [phone]

## 2023-11-29 ENCOUNTER — Ambulatory Visit (INDEPENDENT_AMBULATORY_CARE_PROVIDER_SITE_OTHER)

## 2023-11-29 DIAGNOSIS — I428 Other cardiomyopathies: Secondary | ICD-10-CM | POA: Diagnosis not present

## 2023-12-01 LAB — CUP PACEART REMOTE DEVICE CHECK
Battery Remaining Percentage: 66 %
Battery Voltage: 66
Date Time Interrogation Session: 20250923032700
HighPow Impedance: 50 Ohm
Implantable Lead Connection Status: 753985
Implantable Lead Implant Date: 20220902
Implantable Lead Location: 753860
Implantable Lead Model: 3501
Implantable Lead Serial Number: 225042
Implantable Pulse Generator Implant Date: 20220902
Pulse Gen Serial Number: 166628

## 2023-12-01 NOTE — Progress Notes (Signed)
Remote ICD Transmission.

## 2023-12-02 ENCOUNTER — Ambulatory Visit: Payer: Self-pay | Admitting: Cardiology

## 2024-01-19 ENCOUNTER — Other Ambulatory Visit (HOSPITAL_COMMUNITY): Payer: Self-pay

## 2024-01-31 ENCOUNTER — Other Ambulatory Visit (HOSPITAL_COMMUNITY): Payer: Self-pay

## 2024-02-28 ENCOUNTER — Ambulatory Visit (INDEPENDENT_AMBULATORY_CARE_PROVIDER_SITE_OTHER)

## 2024-02-28 DIAGNOSIS — I428 Other cardiomyopathies: Secondary | ICD-10-CM | POA: Diagnosis not present

## 2024-02-29 LAB — CUP PACEART REMOTE DEVICE CHECK
Battery Remaining Percentage: 63 %
Battery Voltage: 63
Date Time Interrogation Session: 20251222153500
HighPow Impedance: 60 Ohm
Implantable Lead Connection Status: 753985
Implantable Lead Implant Date: 20220902
Implantable Lead Location: 753860
Implantable Lead Model: 3501
Implantable Lead Serial Number: 225042
Implantable Pulse Generator Implant Date: 20220902
Pulse Gen Serial Number: 166628

## 2024-03-01 NOTE — Progress Notes (Signed)
 Remote ICD Transmission

## 2024-03-03 ENCOUNTER — Ambulatory Visit: Payer: Self-pay | Admitting: Cardiology

## 2024-05-29 ENCOUNTER — Encounter

## 2024-08-28 ENCOUNTER — Encounter

## 2024-11-27 ENCOUNTER — Encounter

## 2025-02-26 ENCOUNTER — Encounter
